# Patient Record
Sex: Female | Born: 1935
Health system: Southern US, Community
[De-identification: ages and names within clinical notes are randomized; demographics above are authoritative.]

## PROBLEM LIST (undated history)

## (undated) DIAGNOSIS — Z85038 Personal history of other malignant neoplasm of large intestine: Secondary | ICD-10-CM

## (undated) DIAGNOSIS — L988 Other specified disorders of the skin and subcutaneous tissue: Secondary | ICD-10-CM

## (undated) DIAGNOSIS — C187 Malignant neoplasm of sigmoid colon: Secondary | ICD-10-CM

## (undated) DIAGNOSIS — K8020 Calculus of gallbladder without cholecystitis without obstruction: Secondary | ICD-10-CM

## (undated) DIAGNOSIS — IMO0002 Reserved for concepts with insufficient information to code with codable children: Secondary | ICD-10-CM

## (undated) DIAGNOSIS — Z936 Other artificial openings of urinary tract status: Secondary | ICD-10-CM

## (undated) DIAGNOSIS — E46 Unspecified protein-calorie malnutrition: Secondary | ICD-10-CM

## (undated) DIAGNOSIS — M545 Low back pain, unspecified: Secondary | ICD-10-CM

## (undated) DIAGNOSIS — C541 Malignant neoplasm of endometrium: Secondary | ICD-10-CM

## (undated) DIAGNOSIS — K635 Polyp of colon: Secondary | ICD-10-CM

## (undated) DIAGNOSIS — M1990 Unspecified osteoarthritis, unspecified site: Secondary | ICD-10-CM

## (undated) DIAGNOSIS — K603 Anal fistula, unspecified: Secondary | ICD-10-CM

## (undated) DIAGNOSIS — K8050 Calculus of bile duct without cholangitis or cholecystitis without obstruction: Secondary | ICD-10-CM

## (undated) DIAGNOSIS — E538 Deficiency of other specified B group vitamins: Secondary | ICD-10-CM

## (undated) DIAGNOSIS — Q6210 Congenital occlusion of ureter, unspecified: Secondary | ICD-10-CM

## (undated) DIAGNOSIS — F329 Major depressive disorder, single episode, unspecified: Secondary | ICD-10-CM

## (undated) DIAGNOSIS — K56609 Unspecified intestinal obstruction, unspecified as to partial versus complete obstruction: Secondary | ICD-10-CM

## (undated) DIAGNOSIS — K631 Perforation of intestine (nontraumatic): Secondary | ICD-10-CM

## (undated) DIAGNOSIS — Z87442 Personal history of urinary calculi: Secondary | ICD-10-CM

## (undated) DIAGNOSIS — Z933 Colostomy status: Secondary | ICD-10-CM

## (undated) DIAGNOSIS — J701 Chronic and other pulmonary manifestations due to radiation: Secondary | ICD-10-CM

## (undated) DIAGNOSIS — F32A Depression, unspecified: Secondary | ICD-10-CM

## (undated) DIAGNOSIS — D649 Anemia, unspecified: Secondary | ICD-10-CM

## (undated) HISTORY — PX: OTHER SURGICAL HISTORY: SHX169

## (undated) HISTORY — DX: Anemia, unspecified: D64.9

## (undated) HISTORY — PX: URINARY DIVERSION: SHX2623

## (undated) HISTORY — DX: Personal history of other malignant neoplasm of large intestine: Z85.038

## (undated) HISTORY — DX: Low back pain: M54.5

## (undated) HISTORY — DX: Personal history of urinary calculi: Z87.442

## (undated) HISTORY — PX: ABCESS DRAINAGE: SHX399

## (undated) HISTORY — DX: Anal fistula, unspecified: K60.30

## (undated) HISTORY — DX: Perforation of intestine (nontraumatic): K63.1

## (undated) HISTORY — DX: Other specified disorders of the skin and subcutaneous tissue: L98.8

## (undated) HISTORY — DX: Low back pain, unspecified: M54.50

## (undated) HISTORY — DX: Malignant neoplasm of endometrium: C54.1

## (undated) HISTORY — DX: Major depressive disorder, single episode, unspecified: F32.9

## (undated) HISTORY — DX: Reserved for concepts with insufficient information to code with codable children: IMO0002

## (undated) HISTORY — DX: Chronic and other pulmonary manifestations due to radiation: J70.1

## (undated) HISTORY — PX: TONSILLECTOMY: SUR1361

## (undated) HISTORY — DX: Congenital occlusion of ureter, unspecified: Q62.10

## (undated) HISTORY — PX: ABDOMINAL HYSTERECTOMY: SHX81

## (undated) HISTORY — DX: Unspecified protein-calorie malnutrition: E46

## (undated) HISTORY — DX: Polyp of colon: K63.5

## (undated) HISTORY — DX: Unspecified intestinal obstruction, unspecified as to partial versus complete obstruction: K56.609

## (undated) HISTORY — DX: Deficiency of other specified B group vitamins: E53.8

## (undated) HISTORY — DX: Anal fistula: K60.3

## (undated) HISTORY — DX: Depression, unspecified: F32.A

---

## 1988-07-09 HISTORY — PX: LOW ANTERIOR BOWEL RESECTION: SUR1240

## 1999-05-27 ENCOUNTER — Encounter (INDEPENDENT_AMBULATORY_CARE_PROVIDER_SITE_OTHER): Payer: Self-pay | Admitting: Specialist

## 1999-05-27 ENCOUNTER — Other Ambulatory Visit: Admission: RE | Admit: 1999-05-27 | Discharge: 1999-05-27 | Payer: Self-pay | Admitting: Internal Medicine

## 2000-11-30 ENCOUNTER — Encounter: Payer: Self-pay | Admitting: Internal Medicine

## 2000-11-30 ENCOUNTER — Encounter: Admission: RE | Admit: 2000-11-30 | Discharge: 2000-11-30 | Payer: Self-pay | Admitting: Internal Medicine

## 2002-07-13 ENCOUNTER — Encounter: Payer: Self-pay | Admitting: Surgery

## 2002-07-13 ENCOUNTER — Inpatient Hospital Stay (HOSPITAL_COMMUNITY): Admission: AD | Admit: 2002-07-13 | Discharge: 2002-07-27 | Payer: Self-pay | Admitting: Surgery

## 2002-07-14 ENCOUNTER — Encounter: Payer: Self-pay | Admitting: Surgery

## 2002-07-15 ENCOUNTER — Encounter: Payer: Self-pay | Admitting: Pulmonary Disease

## 2002-07-16 ENCOUNTER — Encounter: Payer: Self-pay | Admitting: Surgery

## 2002-07-18 ENCOUNTER — Encounter: Payer: Self-pay | Admitting: Surgery

## 2002-07-20 ENCOUNTER — Encounter: Payer: Self-pay | Admitting: Surgery

## 2002-07-31 ENCOUNTER — Inpatient Hospital Stay (HOSPITAL_COMMUNITY): Admission: EM | Admit: 2002-07-31 | Discharge: 2002-08-19 | Payer: Self-pay | Admitting: *Deleted

## 2002-08-05 ENCOUNTER — Encounter: Payer: Self-pay | Admitting: Surgery

## 2002-08-06 ENCOUNTER — Encounter: Payer: Self-pay | Admitting: General Surgery

## 2002-08-08 HISTORY — PX: OTHER SURGICAL HISTORY: SHX169

## 2002-08-09 ENCOUNTER — Encounter: Payer: Self-pay | Admitting: General Surgery

## 2002-08-12 ENCOUNTER — Encounter: Payer: Self-pay | Admitting: Surgery

## 2002-08-13 ENCOUNTER — Encounter: Payer: Self-pay | Admitting: Surgery

## 2002-08-14 ENCOUNTER — Encounter: Payer: Self-pay | Admitting: Surgery

## 2002-08-27 ENCOUNTER — Encounter: Payer: Self-pay | Admitting: Surgery

## 2002-08-27 ENCOUNTER — Ambulatory Visit (HOSPITAL_COMMUNITY): Admission: RE | Admit: 2002-08-27 | Discharge: 2002-08-27 | Payer: Self-pay | Admitting: Surgery

## 2002-09-17 ENCOUNTER — Inpatient Hospital Stay (HOSPITAL_COMMUNITY): Admission: EM | Admit: 2002-09-17 | Discharge: 2002-09-21 | Payer: Self-pay | Admitting: Surgery

## 2002-09-17 ENCOUNTER — Encounter: Payer: Self-pay | Admitting: Internal Medicine

## 2002-09-18 ENCOUNTER — Encounter (INDEPENDENT_AMBULATORY_CARE_PROVIDER_SITE_OTHER): Payer: Self-pay | Admitting: *Deleted

## 2002-09-18 HISTORY — PX: OTHER SURGICAL HISTORY: SHX169

## 2002-09-19 ENCOUNTER — Encounter: Payer: Self-pay | Admitting: Surgery

## 2002-11-20 ENCOUNTER — Encounter (INDEPENDENT_AMBULATORY_CARE_PROVIDER_SITE_OTHER): Payer: Self-pay | Admitting: Specialist

## 2002-11-20 ENCOUNTER — Ambulatory Visit (HOSPITAL_COMMUNITY): Admission: RE | Admit: 2002-11-20 | Discharge: 2002-11-20 | Payer: Self-pay | Admitting: Internal Medicine

## 2002-12-19 ENCOUNTER — Inpatient Hospital Stay (HOSPITAL_COMMUNITY): Admission: RE | Admit: 2002-12-19 | Discharge: 2002-12-31 | Payer: Self-pay | Admitting: Surgery

## 2002-12-19 ENCOUNTER — Encounter (INDEPENDENT_AMBULATORY_CARE_PROVIDER_SITE_OTHER): Payer: Self-pay | Admitting: Specialist

## 2002-12-21 ENCOUNTER — Encounter: Payer: Self-pay | Admitting: Surgery

## 2002-12-23 ENCOUNTER — Encounter: Payer: Self-pay | Admitting: Surgery

## 2002-12-25 ENCOUNTER — Encounter: Payer: Self-pay | Admitting: Surgery

## 2003-04-08 ENCOUNTER — Inpatient Hospital Stay (HOSPITAL_COMMUNITY): Admission: AD | Admit: 2003-04-08 | Discharge: 2003-04-18 | Payer: Self-pay | Admitting: Internal Medicine

## 2003-04-08 ENCOUNTER — Encounter: Payer: Self-pay | Admitting: Internal Medicine

## 2003-04-11 ENCOUNTER — Encounter: Payer: Self-pay | Admitting: Cardiology

## 2003-05-08 ENCOUNTER — Inpatient Hospital Stay (HOSPITAL_COMMUNITY): Admission: EM | Admit: 2003-05-08 | Discharge: 2003-05-22 | Payer: Self-pay | Admitting: Emergency Medicine

## 2003-05-08 ENCOUNTER — Encounter: Payer: Self-pay | Admitting: General Surgery

## 2003-05-08 ENCOUNTER — Encounter: Payer: Self-pay | Admitting: Pulmonary Disease

## 2003-05-12 ENCOUNTER — Encounter: Payer: Self-pay | Admitting: Internal Medicine

## 2003-05-16 ENCOUNTER — Encounter: Payer: Self-pay | Admitting: Surgery

## 2003-05-19 ENCOUNTER — Encounter: Payer: Self-pay | Admitting: Surgery

## 2003-05-19 ENCOUNTER — Encounter: Payer: Self-pay | Admitting: Endocrinology

## 2003-08-20 ENCOUNTER — Ambulatory Visit (HOSPITAL_COMMUNITY): Admission: RE | Admit: 2003-08-20 | Discharge: 2003-08-20 | Payer: Self-pay | Admitting: Urology

## 2003-08-20 ENCOUNTER — Ambulatory Visit (HOSPITAL_BASED_OUTPATIENT_CLINIC_OR_DEPARTMENT_OTHER): Admission: RE | Admit: 2003-08-20 | Discharge: 2003-08-20 | Payer: Self-pay | Admitting: Urology

## 2003-10-30 ENCOUNTER — Ambulatory Visit (HOSPITAL_COMMUNITY): Admission: RE | Admit: 2003-10-30 | Discharge: 2003-10-30 | Payer: Self-pay | Admitting: Surgery

## 2003-11-12 ENCOUNTER — Ambulatory Visit (HOSPITAL_COMMUNITY): Admission: RE | Admit: 2003-11-12 | Discharge: 2003-11-12 | Payer: Self-pay | Admitting: Urology

## 2003-11-12 ENCOUNTER — Ambulatory Visit (HOSPITAL_BASED_OUTPATIENT_CLINIC_OR_DEPARTMENT_OTHER): Admission: RE | Admit: 2003-11-12 | Discharge: 2003-11-12 | Payer: Self-pay | Admitting: Urology

## 2004-01-07 ENCOUNTER — Ambulatory Visit (HOSPITAL_BASED_OUTPATIENT_CLINIC_OR_DEPARTMENT_OTHER): Admission: RE | Admit: 2004-01-07 | Discharge: 2004-01-07 | Payer: Self-pay | Admitting: Urology

## 2004-01-07 ENCOUNTER — Ambulatory Visit (HOSPITAL_COMMUNITY): Admission: RE | Admit: 2004-01-07 | Discharge: 2004-01-07 | Payer: Self-pay | Admitting: Urology

## 2004-01-22 ENCOUNTER — Ambulatory Visit (HOSPITAL_COMMUNITY): Admission: RE | Admit: 2004-01-22 | Discharge: 2004-01-22 | Payer: Self-pay | Admitting: Surgery

## 2004-04-14 ENCOUNTER — Ambulatory Visit (HOSPITAL_COMMUNITY): Admission: RE | Admit: 2004-04-14 | Discharge: 2004-04-14 | Payer: Self-pay | Admitting: Urology

## 2004-04-14 ENCOUNTER — Ambulatory Visit (HOSPITAL_BASED_OUTPATIENT_CLINIC_OR_DEPARTMENT_OTHER): Admission: RE | Admit: 2004-04-14 | Discharge: 2004-04-14 | Payer: Self-pay | Admitting: Urology

## 2004-06-08 ENCOUNTER — Ambulatory Visit: Payer: Self-pay | Admitting: Internal Medicine

## 2004-06-08 ENCOUNTER — Encounter: Admission: RE | Admit: 2004-06-08 | Discharge: 2004-06-08 | Payer: Self-pay | Admitting: Urology

## 2004-06-09 ENCOUNTER — Ambulatory Visit (HOSPITAL_BASED_OUTPATIENT_CLINIC_OR_DEPARTMENT_OTHER): Admission: RE | Admit: 2004-06-09 | Discharge: 2004-06-09 | Payer: Self-pay | Admitting: Urology

## 2004-06-09 ENCOUNTER — Ambulatory Visit (HOSPITAL_COMMUNITY): Admission: RE | Admit: 2004-06-09 | Discharge: 2004-06-09 | Payer: Self-pay | Admitting: Urology

## 2004-06-10 ENCOUNTER — Ambulatory Visit: Payer: Self-pay | Admitting: Internal Medicine

## 2004-08-03 ENCOUNTER — Ambulatory Visit: Payer: Self-pay | Admitting: Internal Medicine

## 2004-09-08 ENCOUNTER — Ambulatory Visit (HOSPITAL_BASED_OUTPATIENT_CLINIC_OR_DEPARTMENT_OTHER): Admission: RE | Admit: 2004-09-08 | Discharge: 2004-09-08 | Payer: Self-pay | Admitting: Urology

## 2004-09-08 ENCOUNTER — Ambulatory Visit (HOSPITAL_COMMUNITY): Admission: RE | Admit: 2004-09-08 | Discharge: 2004-09-08 | Payer: Self-pay | Admitting: Urology

## 2004-09-09 ENCOUNTER — Ambulatory Visit: Payer: Self-pay | Admitting: Internal Medicine

## 2004-12-07 ENCOUNTER — Ambulatory Visit (HOSPITAL_COMMUNITY): Admission: RE | Admit: 2004-12-07 | Discharge: 2004-12-07 | Payer: Self-pay | Admitting: Urology

## 2004-12-07 ENCOUNTER — Ambulatory Visit (HOSPITAL_BASED_OUTPATIENT_CLINIC_OR_DEPARTMENT_OTHER): Admission: RE | Admit: 2004-12-07 | Discharge: 2004-12-07 | Payer: Self-pay | Admitting: Urology

## 2004-12-23 ENCOUNTER — Ambulatory Visit: Payer: Self-pay | Admitting: Internal Medicine

## 2005-01-13 ENCOUNTER — Ambulatory Visit: Payer: Self-pay | Admitting: Internal Medicine

## 2005-01-13 ENCOUNTER — Encounter (INDEPENDENT_AMBULATORY_CARE_PROVIDER_SITE_OTHER): Payer: Self-pay | Admitting: Specialist

## 2005-01-13 HISTORY — PX: COLOSTOMY TAKEDOWN: SHX5258

## 2005-03-02 ENCOUNTER — Ambulatory Visit (HOSPITAL_BASED_OUTPATIENT_CLINIC_OR_DEPARTMENT_OTHER): Admission: RE | Admit: 2005-03-02 | Discharge: 2005-03-02 | Payer: Self-pay | Admitting: Urology

## 2005-03-02 ENCOUNTER — Ambulatory Visit (HOSPITAL_COMMUNITY): Admission: RE | Admit: 2005-03-02 | Discharge: 2005-03-02 | Payer: Self-pay | Admitting: Urology

## 2005-03-15 ENCOUNTER — Ambulatory Visit: Payer: Self-pay | Admitting: Internal Medicine

## 2005-03-29 ENCOUNTER — Ambulatory Visit (HOSPITAL_COMMUNITY): Admission: RE | Admit: 2005-03-29 | Discharge: 2005-03-29 | Payer: Self-pay | Admitting: Surgery

## 2005-05-23 ENCOUNTER — Ambulatory Visit (HOSPITAL_COMMUNITY): Admission: RE | Admit: 2005-05-23 | Discharge: 2005-05-23 | Payer: Self-pay | Admitting: Surgery

## 2005-06-01 ENCOUNTER — Ambulatory Visit (HOSPITAL_BASED_OUTPATIENT_CLINIC_OR_DEPARTMENT_OTHER): Admission: RE | Admit: 2005-06-01 | Discharge: 2005-06-01 | Payer: Self-pay | Admitting: Urology

## 2005-06-01 ENCOUNTER — Ambulatory Visit (HOSPITAL_COMMUNITY): Admission: RE | Admit: 2005-06-01 | Discharge: 2005-06-01 | Payer: Self-pay | Admitting: Urology

## 2005-06-16 ENCOUNTER — Ambulatory Visit: Payer: Self-pay | Admitting: Internal Medicine

## 2005-07-15 ENCOUNTER — Ambulatory Visit: Payer: Self-pay | Admitting: Gastroenterology

## 2005-07-21 ENCOUNTER — Ambulatory Visit: Payer: Self-pay | Admitting: Internal Medicine

## 2005-08-23 ENCOUNTER — Encounter: Admission: RE | Admit: 2005-08-23 | Discharge: 2005-08-23 | Payer: Self-pay | Admitting: Urology

## 2005-08-24 ENCOUNTER — Ambulatory Visit (HOSPITAL_BASED_OUTPATIENT_CLINIC_OR_DEPARTMENT_OTHER): Admission: RE | Admit: 2005-08-24 | Discharge: 2005-08-24 | Payer: Self-pay | Admitting: Urology

## 2005-09-15 ENCOUNTER — Ambulatory Visit: Payer: Self-pay | Admitting: Internal Medicine

## 2005-09-20 ENCOUNTER — Ambulatory Visit: Payer: Self-pay | Admitting: Internal Medicine

## 2005-10-26 ENCOUNTER — Ambulatory Visit (HOSPITAL_BASED_OUTPATIENT_CLINIC_OR_DEPARTMENT_OTHER): Admission: RE | Admit: 2005-10-26 | Discharge: 2005-10-26 | Payer: Self-pay | Admitting: Urology

## 2005-11-04 ENCOUNTER — Ambulatory Visit (HOSPITAL_COMMUNITY): Admission: RE | Admit: 2005-11-04 | Discharge: 2005-11-04 | Payer: Self-pay | Admitting: General Surgery

## 2005-12-02 ENCOUNTER — Ambulatory Visit (HOSPITAL_COMMUNITY): Admission: RE | Admit: 2005-12-02 | Discharge: 2005-12-02 | Payer: Self-pay | Admitting: Surgery

## 2005-12-20 ENCOUNTER — Ambulatory Visit: Payer: Self-pay | Admitting: Internal Medicine

## 2006-02-09 ENCOUNTER — Ambulatory Visit: Payer: Self-pay | Admitting: Internal Medicine

## 2006-02-15 ENCOUNTER — Ambulatory Visit (HOSPITAL_BASED_OUTPATIENT_CLINIC_OR_DEPARTMENT_OTHER): Admission: RE | Admit: 2006-02-15 | Discharge: 2006-02-15 | Payer: Self-pay | Admitting: Urology

## 2006-02-16 ENCOUNTER — Emergency Department (HOSPITAL_COMMUNITY): Admission: EM | Admit: 2006-02-16 | Discharge: 2006-02-16 | Payer: Self-pay | Admitting: Emergency Medicine

## 2006-02-16 ENCOUNTER — Ambulatory Visit: Payer: Self-pay | Admitting: Internal Medicine

## 2006-02-28 ENCOUNTER — Ambulatory Visit: Payer: Self-pay | Admitting: Internal Medicine

## 2006-04-04 ENCOUNTER — Ambulatory Visit: Payer: Self-pay | Admitting: Internal Medicine

## 2006-05-02 ENCOUNTER — Ambulatory Visit (HOSPITAL_COMMUNITY): Admission: RE | Admit: 2006-05-02 | Discharge: 2006-05-02 | Payer: Self-pay | Admitting: Internal Medicine

## 2006-05-24 ENCOUNTER — Ambulatory Visit (HOSPITAL_BASED_OUTPATIENT_CLINIC_OR_DEPARTMENT_OTHER): Admission: RE | Admit: 2006-05-24 | Discharge: 2006-05-24 | Payer: Self-pay | Admitting: Urology

## 2006-05-25 ENCOUNTER — Inpatient Hospital Stay (HOSPITAL_COMMUNITY): Admission: RE | Admit: 2006-05-25 | Discharge: 2006-05-27 | Payer: Self-pay | Admitting: Urology

## 2006-05-25 ENCOUNTER — Encounter: Payer: Self-pay | Admitting: Urology

## 2006-06-09 ENCOUNTER — Ambulatory Visit: Payer: Self-pay | Admitting: Internal Medicine

## 2006-06-22 ENCOUNTER — Ambulatory Visit (HOSPITAL_COMMUNITY): Admission: RE | Admit: 2006-06-22 | Discharge: 2006-06-22 | Payer: Self-pay | Admitting: Urology

## 2006-07-03 ENCOUNTER — Ambulatory Visit: Payer: Self-pay | Admitting: Internal Medicine

## 2006-07-12 ENCOUNTER — Inpatient Hospital Stay (HOSPITAL_COMMUNITY): Admission: RE | Admit: 2006-07-12 | Discharge: 2006-07-30 | Payer: Self-pay | Admitting: Surgery

## 2006-07-12 HISTORY — PX: COLOSTOMY: SHX63

## 2006-07-14 ENCOUNTER — Ambulatory Visit: Payer: Self-pay | Admitting: Internal Medicine

## 2006-07-17 ENCOUNTER — Encounter (INDEPENDENT_AMBULATORY_CARE_PROVIDER_SITE_OTHER): Payer: Self-pay | Admitting: Specialist

## 2006-07-17 HISTORY — PX: COLOSTOMY TAKEDOWN: SHX5258

## 2006-07-20 ENCOUNTER — Ambulatory Visit: Payer: Self-pay | Admitting: Internal Medicine

## 2006-08-05 ENCOUNTER — Inpatient Hospital Stay (HOSPITAL_COMMUNITY): Admission: EM | Admit: 2006-08-05 | Discharge: 2006-08-13 | Payer: Self-pay | Admitting: Emergency Medicine

## 2006-08-05 ENCOUNTER — Ambulatory Visit: Payer: Self-pay | Admitting: Internal Medicine

## 2006-08-08 HISTORY — PX: NEPHROSTOMY: SHX1014

## 2006-08-24 ENCOUNTER — Ambulatory Visit: Payer: Self-pay | Admitting: Internal Medicine

## 2006-09-04 ENCOUNTER — Ambulatory Visit: Payer: Self-pay | Admitting: Internal Medicine

## 2006-09-14 ENCOUNTER — Ambulatory Visit: Payer: Self-pay | Admitting: Internal Medicine

## 2006-09-19 ENCOUNTER — Ambulatory Visit (HOSPITAL_COMMUNITY): Admission: RE | Admit: 2006-09-19 | Discharge: 2006-09-19 | Payer: Self-pay | Admitting: Urology

## 2006-10-17 ENCOUNTER — Ambulatory Visit: Payer: Self-pay | Admitting: Cardiology

## 2006-10-19 ENCOUNTER — Ambulatory Visit: Payer: Self-pay | Admitting: Internal Medicine

## 2006-10-31 ENCOUNTER — Ambulatory Visit: Payer: Self-pay | Admitting: Pulmonary Disease

## 2006-10-31 ENCOUNTER — Inpatient Hospital Stay (HOSPITAL_COMMUNITY): Admission: EM | Admit: 2006-10-31 | Discharge: 2006-11-02 | Payer: Self-pay | Admitting: Emergency Medicine

## 2006-11-02 ENCOUNTER — Ambulatory Visit: Payer: Self-pay | Admitting: Internal Medicine

## 2006-11-21 ENCOUNTER — Ambulatory Visit (HOSPITAL_COMMUNITY): Admission: RE | Admit: 2006-11-21 | Discharge: 2006-11-21 | Payer: Self-pay | Admitting: Urology

## 2006-11-21 ENCOUNTER — Ambulatory Visit: Payer: Self-pay | Admitting: Internal Medicine

## 2006-11-21 LAB — CONVERTED CEMR LAB
Basophils Relative: 0.4 % (ref 0.0–1.0)
Bilirubin, Direct: 0.1 mg/dL (ref 0.0–0.3)
CO2: 27 meq/L (ref 19–32)
Eosinophils Absolute: 0.2 10*3/uL (ref 0.0–0.6)
Eosinophils Relative: 3.9 % (ref 0.0–5.0)
GFR calc Af Amer: 62 mL/min
Glucose, Bld: 93 mg/dL (ref 70–99)
Hemoglobin: 11.8 g/dL — ABNORMAL LOW (ref 12.0–15.0)
Lymphocytes Relative: 17.7 % (ref 12.0–46.0)
MCV: 89.8 fL (ref 78.0–100.0)
Monocytes Absolute: 0.3 10*3/uL (ref 0.2–0.7)
Monocytes Relative: 4.8 % (ref 3.0–11.0)
Neutro Abs: 4.7 10*3/uL (ref 1.4–7.7)
PTH: 57.2 pg/mL (ref 14.0–72.0)
Potassium: 4 meq/L (ref 3.5–5.1)
TSH: 1.39 microintl units/mL (ref 0.35–5.50)
Total Protein: 7 g/dL (ref 6.0–8.3)
Vitamin B-12: 626 pg/mL (ref 211–911)
WBC: 6.3 10*3/uL (ref 4.5–10.5)

## 2006-12-21 ENCOUNTER — Ambulatory Visit: Payer: Self-pay | Admitting: Internal Medicine

## 2007-01-04 ENCOUNTER — Ambulatory Visit (HOSPITAL_COMMUNITY): Admission: RE | Admit: 2007-01-04 | Discharge: 2007-01-04 | Payer: Self-pay | Admitting: Urology

## 2007-02-15 ENCOUNTER — Ambulatory Visit (HOSPITAL_COMMUNITY): Admission: RE | Admit: 2007-02-15 | Discharge: 2007-02-15 | Payer: Self-pay | Admitting: Urology

## 2007-02-19 ENCOUNTER — Inpatient Hospital Stay (HOSPITAL_COMMUNITY): Admission: EM | Admit: 2007-02-19 | Discharge: 2007-02-21 | Payer: Self-pay | Admitting: Emergency Medicine

## 2007-02-22 DIAGNOSIS — F331 Major depressive disorder, recurrent, moderate: Secondary | ICD-10-CM | POA: Insufficient documentation

## 2007-02-22 DIAGNOSIS — K603 Anal fistula: Secondary | ICD-10-CM | POA: Insufficient documentation

## 2007-02-22 DIAGNOSIS — D126 Benign neoplasm of colon, unspecified: Secondary | ICD-10-CM | POA: Insufficient documentation

## 2007-02-22 DIAGNOSIS — Z85038 Personal history of other malignant neoplasm of large intestine: Secondary | ICD-10-CM | POA: Insufficient documentation

## 2007-02-22 DIAGNOSIS — C549 Malignant neoplasm of corpus uteri, unspecified: Secondary | ICD-10-CM | POA: Insufficient documentation

## 2007-02-22 DIAGNOSIS — N135 Crossing vessel and stricture of ureter without hydronephrosis: Secondary | ICD-10-CM | POA: Insufficient documentation

## 2007-02-22 DIAGNOSIS — M810 Age-related osteoporosis without current pathological fracture: Secondary | ICD-10-CM | POA: Insufficient documentation

## 2007-02-22 DIAGNOSIS — E538 Deficiency of other specified B group vitamins: Secondary | ICD-10-CM | POA: Insufficient documentation

## 2007-02-22 DIAGNOSIS — N951 Menopausal and female climacteric states: Secondary | ICD-10-CM | POA: Insufficient documentation

## 2007-03-02 ENCOUNTER — Ambulatory Visit: Payer: Self-pay | Admitting: Internal Medicine

## 2007-03-05 ENCOUNTER — Ambulatory Visit (HOSPITAL_COMMUNITY): Admission: RE | Admit: 2007-03-05 | Discharge: 2007-03-05 | Payer: Self-pay | Admitting: Urology

## 2007-04-19 ENCOUNTER — Ambulatory Visit: Payer: Self-pay | Admitting: Internal Medicine

## 2007-04-19 ENCOUNTER — Ambulatory Visit (HOSPITAL_COMMUNITY): Admission: RE | Admit: 2007-04-19 | Discharge: 2007-04-19 | Payer: Self-pay | Admitting: Urology

## 2007-04-19 LAB — CONVERTED CEMR LAB
AST: 18 units/L (ref 0–37)
Albumin: 3.3 g/dL — ABNORMAL LOW (ref 3.5–5.2)
Basophils Absolute: 0 10*3/uL (ref 0.0–0.1)
Chloride: 107 meq/L (ref 96–112)
Eosinophils Absolute: 0.2 10*3/uL (ref 0.0–0.6)
GFR calc non Af Amer: 65 mL/min
MCHC: 35.6 g/dL (ref 30.0–36.0)
MCV: 92.8 fL (ref 78.0–100.0)
Monocytes Relative: 6.6 % (ref 3.0–11.0)
Neutrophils Relative %: 62.8 % (ref 43.0–77.0)
Platelets: 163 10*3/uL (ref 150–400)
RBC: 3.71 M/uL — ABNORMAL LOW (ref 3.87–5.11)
Sodium: 141 meq/L (ref 135–145)
TSH: 1.92 microintl units/mL (ref 0.35–5.50)
Vitamin B-12: 177 pg/mL — ABNORMAL LOW (ref 211–911)

## 2007-04-27 DIAGNOSIS — M545 Low back pain, unspecified: Secondary | ICD-10-CM | POA: Insufficient documentation

## 2007-04-27 DIAGNOSIS — D649 Anemia, unspecified: Secondary | ICD-10-CM | POA: Insufficient documentation

## 2007-04-30 ENCOUNTER — Ambulatory Visit (HOSPITAL_COMMUNITY): Admission: RE | Admit: 2007-04-30 | Discharge: 2007-04-30 | Payer: Self-pay | Admitting: Urology

## 2007-05-08 ENCOUNTER — Ambulatory Visit: Payer: Self-pay | Admitting: Internal Medicine

## 2007-06-12 ENCOUNTER — Ambulatory Visit (HOSPITAL_COMMUNITY): Admission: RE | Admit: 2007-06-12 | Discharge: 2007-06-12 | Payer: Self-pay | Admitting: Urology

## 2007-07-09 ENCOUNTER — Telehealth: Payer: Self-pay | Admitting: Internal Medicine

## 2007-07-23 ENCOUNTER — Telehealth (INDEPENDENT_AMBULATORY_CARE_PROVIDER_SITE_OTHER): Payer: Self-pay | Admitting: *Deleted

## 2007-07-26 ENCOUNTER — Ambulatory Visit (HOSPITAL_COMMUNITY): Admission: RE | Admit: 2007-07-26 | Discharge: 2007-07-26 | Payer: Self-pay | Admitting: Urology

## 2007-08-03 ENCOUNTER — Ambulatory Visit: Payer: Self-pay | Admitting: Internal Medicine

## 2007-08-03 DIAGNOSIS — E559 Vitamin D deficiency, unspecified: Secondary | ICD-10-CM | POA: Insufficient documentation

## 2007-08-03 DIAGNOSIS — Z87442 Personal history of urinary calculi: Secondary | ICD-10-CM | POA: Insufficient documentation

## 2007-08-03 LAB — CONVERTED CEMR LAB
Eosinophils Relative: 4.4 % (ref 0.0–5.0)
HCT: 37 % (ref 36.0–46.0)
MCV: 92.4 fL (ref 78.0–100.0)
Neutrophils Relative %: 67.4 % (ref 43.0–77.0)
Potassium: 4.2 meq/L (ref 3.5–5.1)
RBC: 4 M/uL (ref 3.87–5.11)
RDW: 13.5 % (ref 11.5–14.6)
Sodium: 142 meq/L (ref 135–145)
Vitamin B-12: 477 pg/mL (ref 211–911)
WBC: 5.2 10*3/uL (ref 4.5–10.5)

## 2007-08-15 LAB — CONVERTED CEMR LAB: Vit D, 1,25-Dihydroxy: 26 — ABNORMAL LOW (ref 30–89)

## 2007-08-29 ENCOUNTER — Ambulatory Visit (HOSPITAL_COMMUNITY): Admission: RE | Admit: 2007-08-29 | Discharge: 2007-08-29 | Payer: Self-pay | Admitting: Urology

## 2007-10-03 ENCOUNTER — Ambulatory Visit (HOSPITAL_COMMUNITY): Admission: RE | Admit: 2007-10-03 | Discharge: 2007-10-03 | Payer: Self-pay | Admitting: Urology

## 2007-10-05 ENCOUNTER — Telehealth: Payer: Self-pay | Admitting: Internal Medicine

## 2007-10-12 ENCOUNTER — Ambulatory Visit: Payer: Self-pay | Admitting: Internal Medicine

## 2007-11-07 ENCOUNTER — Ambulatory Visit (HOSPITAL_COMMUNITY): Admission: RE | Admit: 2007-11-07 | Discharge: 2007-11-07 | Payer: Self-pay | Admitting: Urology

## 2007-12-06 ENCOUNTER — Ambulatory Visit: Payer: Self-pay | Admitting: Internal Medicine

## 2007-12-06 LAB — CONVERTED CEMR LAB
ALT: 14 U/L
AST: 16 U/L
Albumin: 3.6 g/dL
Alkaline Phosphatase: 100 U/L
BUN: 10 mg/dL
Bilirubin, Direct: 0.1 mg/dL
CO2: 30 meq/L
Calcium: 9.8 mg/dL
Chloride: 106 meq/L
Creatinine, Ser: 1 mg/dL
GFR calc Af Amer: 69 mL/min
GFR calc non Af Amer: 57 mL/min
Glucose, Bld: 100 mg/dL — ABNORMAL HIGH
Potassium: 4.3 meq/L
Sodium: 140 meq/L
TSH: 4.38 u[IU]/mL
Total Bilirubin: 0.6 mg/dL
Total Protein: 6.5 g/dL

## 2007-12-07 ENCOUNTER — Encounter: Payer: Self-pay | Admitting: Internal Medicine

## 2007-12-07 ENCOUNTER — Inpatient Hospital Stay (HOSPITAL_COMMUNITY): Admission: EM | Admit: 2007-12-07 | Discharge: 2007-12-11 | Payer: Self-pay | Admitting: Emergency Medicine

## 2007-12-07 ENCOUNTER — Ambulatory Visit: Payer: Self-pay | Admitting: Internal Medicine

## 2007-12-07 DIAGNOSIS — N12 Tubulo-interstitial nephritis, not specified as acute or chronic: Secondary | ICD-10-CM | POA: Insufficient documentation

## 2007-12-18 ENCOUNTER — Ambulatory Visit: Payer: Self-pay | Admitting: Internal Medicine

## 2008-01-10 ENCOUNTER — Ambulatory Visit (HOSPITAL_COMMUNITY): Admission: RE | Admit: 2008-01-10 | Discharge: 2008-01-10 | Payer: Self-pay | Admitting: Urology

## 2008-02-18 ENCOUNTER — Ambulatory Visit: Payer: Self-pay | Admitting: Internal Medicine

## 2008-02-19 ENCOUNTER — Ambulatory Visit (HOSPITAL_COMMUNITY): Admission: RE | Admit: 2008-02-19 | Discharge: 2008-02-19 | Payer: Self-pay | Admitting: Urology

## 2008-04-08 ENCOUNTER — Ambulatory Visit: Payer: Self-pay | Admitting: Internal Medicine

## 2008-04-08 LAB — CONVERTED CEMR LAB
BUN: 19 mg/dL (ref 6–23)
Basophils Relative: 0.8 % (ref 0.0–3.0)
Chloride: 111 meq/L (ref 96–112)
Creatinine, Ser: 1 mg/dL (ref 0.4–1.2)
Eosinophils Absolute: 0.3 10*3/uL (ref 0.0–0.7)
Eosinophils Relative: 6.3 % — ABNORMAL HIGH (ref 0.0–5.0)
GFR calc non Af Amer: 57 mL/min
Glucose, Bld: 100 mg/dL — ABNORMAL HIGH (ref 70–99)
HCT: 39.5 % (ref 36.0–46.0)
MCV: 94.1 fL (ref 78.0–100.0)
Monocytes Absolute: 0.4 10*3/uL (ref 0.1–1.0)
Monocytes Relative: 7.4 % (ref 3.0–12.0)
Neutrophils Relative %: 62.3 % (ref 43.0–77.0)
RBC: 4.2 M/uL (ref 3.87–5.11)
WBC: 5.1 10*3/uL (ref 4.5–10.5)

## 2008-04-29 ENCOUNTER — Ambulatory Visit: Payer: Self-pay | Admitting: Internal Medicine

## 2008-06-30 ENCOUNTER — Ambulatory Visit: Payer: Self-pay | Admitting: Internal Medicine

## 2008-08-28 ENCOUNTER — Ambulatory Visit: Payer: Self-pay | Admitting: Internal Medicine

## 2008-08-28 LAB — CONVERTED CEMR LAB
Albumin: 3.6 g/dL (ref 3.5–5.2)
BUN: 17 mg/dL (ref 6–23)
Basophils Absolute: 0 10*3/uL (ref 0.0–0.1)
Basophils Relative: 0.2 % (ref 0.0–3.0)
Calcium: 9.9 mg/dL (ref 8.4–10.5)
Creatinine, Ser: 1.1 mg/dL (ref 0.4–1.2)
Eosinophils Absolute: 0.2 10*3/uL (ref 0.0–0.7)
Eosinophils Relative: 4.6 % (ref 0.0–5.0)
GFR calc Af Amer: 62 mL/min
GFR calc non Af Amer: 51 mL/min
HCT: 37.3 % (ref 36.0–46.0)
Hemoglobin: 12.8 g/dL (ref 12.0–15.0)
MCHC: 34.3 g/dL (ref 30.0–36.0)
MCV: 92.3 fL (ref 78.0–100.0)
Monocytes Absolute: 0.4 10*3/uL (ref 0.1–1.0)
Neutro Abs: 2.9 10*3/uL (ref 1.4–7.7)
Neutrophils Relative %: 60.7 % (ref 43.0–77.0)
RBC: 4.04 M/uL (ref 3.87–5.11)
Total Bilirubin: 0.6 mg/dL (ref 0.3–1.2)

## 2008-09-03 ENCOUNTER — Ambulatory Visit: Payer: Self-pay | Admitting: Internal Medicine

## 2008-10-29 ENCOUNTER — Ambulatory Visit: Payer: Self-pay | Admitting: Internal Medicine

## 2008-12-31 ENCOUNTER — Ambulatory Visit: Payer: Self-pay | Admitting: Internal Medicine

## 2009-01-02 LAB — CONVERTED CEMR LAB
ALT: 14 units/L (ref 0–35)
AST: 20 units/L (ref 0–37)
BUN: 20 mg/dL (ref 6–23)
Basophils Relative: 0.1 % (ref 0.0–3.0)
Bilirubin, Direct: 0.2 mg/dL (ref 0.0–0.3)
Calcium: 9.9 mg/dL (ref 8.4–10.5)
Eosinophils Absolute: 0.1 10*3/uL (ref 0.0–0.7)
Eosinophils Relative: 3.4 % (ref 0.0–5.0)
GFR calc non Af Amer: 56.84 mL/min (ref >60)
Glucose, Bld: 83 mg/dL (ref 70–99)
HCT: 36.9 % (ref 36.0–46.0)
Lymphs Abs: 0.7 10*3/uL (ref 0.7–4.0)
MCHC: 33.8 g/dL (ref 30.0–36.0)
MCV: 91.3 fL (ref 78.0–100.0)
Monocytes Absolute: 0.2 10*3/uL (ref 0.1–1.0)
Neutrophils Relative %: 70.7 % (ref 43.0–77.0)
Platelets: 127 10*3/uL — ABNORMAL LOW (ref 150.0–400.0)
Potassium: 4.1 meq/L (ref 3.5–5.1)
Sodium: 142 meq/L (ref 135–145)
TSH: 3.63 microintl units/mL (ref 0.35–5.50)
Total Bilirubin: 0.7 mg/dL (ref 0.3–1.2)
WBC: 3.8 10*3/uL — ABNORMAL LOW (ref 4.5–10.5)

## 2009-02-27 ENCOUNTER — Ambulatory Visit: Payer: Self-pay | Admitting: Internal Medicine

## 2009-04-22 ENCOUNTER — Encounter: Payer: Self-pay | Admitting: Internal Medicine

## 2009-05-07 ENCOUNTER — Ambulatory Visit: Payer: Self-pay | Admitting: Internal Medicine

## 2009-05-07 LAB — CONVERTED CEMR LAB
BUN: 12 mg/dL (ref 6–23)
Basophils Absolute: 0 10*3/uL (ref 0.0–0.1)
Calcium: 10 mg/dL (ref 8.4–10.5)
Creatinine, Ser: 1.1 mg/dL (ref 0.4–1.2)
Eosinophils Relative: 3.2 % (ref 0.0–5.0)
GFR calc non Af Amer: 50.87 mL/min (ref >60)
Glucose, Bld: 90 mg/dL (ref 70–99)
Lymphocytes Relative: 11.1 % — ABNORMAL LOW (ref 12.0–46.0)
Lymphs Abs: 0.6 10*3/uL — ABNORMAL LOW (ref 0.7–4.0)
Monocytes Relative: 3.7 % (ref 3.0–12.0)
Neutrophils Relative %: 81.8 % — ABNORMAL HIGH (ref 43.0–77.0)
Platelets: 139 10*3/uL — ABNORMAL LOW (ref 150.0–400.0)
RDW: 13.5 % (ref 11.5–14.6)
Vit D, 25-Hydroxy: 30 ng/mL (ref 30–89)
Vitamin B-12: 622 pg/mL (ref 211–911)
WBC: 5.5 10*3/uL (ref 4.5–10.5)

## 2009-05-11 ENCOUNTER — Ambulatory Visit: Payer: Self-pay | Admitting: Internal Medicine

## 2009-07-08 ENCOUNTER — Ambulatory Visit: Payer: Self-pay | Admitting: Internal Medicine

## 2009-07-08 DIAGNOSIS — Z87891 Personal history of nicotine dependence: Secondary | ICD-10-CM | POA: Insufficient documentation

## 2009-09-09 ENCOUNTER — Ambulatory Visit: Payer: Self-pay | Admitting: Internal Medicine

## 2009-11-06 ENCOUNTER — Ambulatory Visit: Payer: Self-pay | Admitting: Internal Medicine

## 2009-11-11 LAB — CONVERTED CEMR LAB
AST: 13 units/L (ref 0–37)
BUN: 9 mg/dL (ref 6–23)
Basophils Absolute: 0 10*3/uL (ref 0.0–0.1)
Bilirubin, Direct: 0.1 mg/dL (ref 0.0–0.3)
Eosinophils Relative: 5.6 % — ABNORMAL HIGH (ref 0.0–5.0)
GFR calc non Af Amer: 56.71 mL/min (ref >60)
HCT: 35.8 % — ABNORMAL LOW (ref 36.0–46.0)
Hemoglobin: 12.1 g/dL (ref 12.0–15.0)
Lymphs Abs: 0.8 10*3/uL (ref 0.7–4.0)
MCV: 92.9 fL (ref 78.0–100.0)
Monocytes Absolute: 0.3 10*3/uL (ref 0.1–1.0)
Monocytes Relative: 8.9 % (ref 3.0–12.0)
Neutro Abs: 2.4 10*3/uL (ref 1.4–7.7)
Potassium: 4.6 meq/L (ref 3.5–5.1)
RDW: 14.4 % (ref 11.5–14.6)
Sodium: 146 meq/L — ABNORMAL HIGH (ref 135–145)
TSH: 2.3 microintl units/mL (ref 0.35–5.50)
Total Bilirubin: 0.5 mg/dL (ref 0.3–1.2)

## 2010-01-08 ENCOUNTER — Ambulatory Visit: Payer: Self-pay | Admitting: Internal Medicine

## 2010-03-01 ENCOUNTER — Telehealth: Payer: Self-pay | Admitting: Internal Medicine

## 2010-03-03 ENCOUNTER — Ambulatory Visit: Payer: Self-pay | Admitting: Internal Medicine

## 2010-03-03 LAB — CONVERTED CEMR LAB
ALT: 14 units/L (ref 0–35)
BUN: 14 mg/dL (ref 6–23)
CO2: 28 meq/L (ref 19–32)
Chloride: 104 meq/L (ref 96–112)
Creatinine, Ser: 1 mg/dL (ref 0.4–1.2)
Eosinophils Relative: 3.8 % (ref 0.0–5.0)
Lymphocytes Relative: 16.2 % (ref 12.0–46.0)
Monocytes Absolute: 0.2 10*3/uL (ref 0.1–1.0)
Monocytes Relative: 6.1 % (ref 3.0–12.0)
Neutrophils Relative %: 73.7 % (ref 43.0–77.0)
Platelets: 126 10*3/uL — ABNORMAL LOW (ref 150.0–400.0)
TSH: 2.11 microintl units/mL (ref 0.35–5.50)
Total Protein: 6.1 g/dL (ref 6.0–8.3)
WBC: 3.9 10*3/uL — ABNORMAL LOW (ref 4.5–10.5)

## 2010-03-05 ENCOUNTER — Ambulatory Visit: Payer: Self-pay | Admitting: Internal Medicine

## 2010-03-05 DIAGNOSIS — R142 Eructation: Secondary | ICD-10-CM

## 2010-03-05 DIAGNOSIS — R143 Flatulence: Secondary | ICD-10-CM

## 2010-03-05 DIAGNOSIS — R141 Gas pain: Secondary | ICD-10-CM | POA: Insufficient documentation

## 2010-04-29 ENCOUNTER — Telehealth: Payer: Self-pay | Admitting: Internal Medicine

## 2010-04-29 ENCOUNTER — Ambulatory Visit: Payer: Self-pay | Admitting: Internal Medicine

## 2010-04-29 LAB — CONVERTED CEMR LAB
ALT: 9 units/L (ref 0–35)
BUN: 14 mg/dL (ref 6–23)
Basophils Absolute: 0 10*3/uL (ref 0.0–0.1)
Bilirubin, Direct: 0.1 mg/dL (ref 0.0–0.3)
Chloride: 103 meq/L (ref 96–112)
Cholesterol: 148 mg/dL (ref 0–200)
Creatinine, Ser: 0.9 mg/dL (ref 0.4–1.2)
Eosinophils Absolute: 0.2 10*3/uL (ref 0.0–0.7)
Eosinophils Relative: 5.2 % — ABNORMAL HIGH (ref 0.0–5.0)
LDL Cholesterol: 70 mg/dL (ref 0–99)
MCV: 92.7 fL (ref 78.0–100.0)
Monocytes Absolute: 0.3 10*3/uL (ref 0.1–1.0)
Neutrophils Relative %: 73.8 % (ref 43.0–77.0)
Platelets: 149 10*3/uL — ABNORMAL LOW (ref 150.0–400.0)
Total Bilirubin: 0.4 mg/dL (ref 0.3–1.2)
Triglycerides: 98 mg/dL (ref 0.0–149.0)
VLDL: 19.6 mg/dL (ref 0.0–40.0)
WBC: 4.7 10*3/uL (ref 4.5–10.5)

## 2010-05-07 ENCOUNTER — Ambulatory Visit: Payer: Self-pay | Admitting: Internal Medicine

## 2010-05-07 DIAGNOSIS — R634 Abnormal weight loss: Secondary | ICD-10-CM | POA: Insufficient documentation

## 2010-07-09 ENCOUNTER — Ambulatory Visit: Payer: Self-pay | Admitting: Internal Medicine

## 2010-08-29 ENCOUNTER — Encounter: Payer: Self-pay | Admitting: Urology

## 2010-09-02 ENCOUNTER — Telehealth: Payer: Self-pay | Admitting: Internal Medicine

## 2010-09-03 ENCOUNTER — Ambulatory Visit: Admit: 2010-09-03 | Payer: Self-pay | Admitting: Internal Medicine

## 2010-09-07 NOTE — Assessment & Plan Note (Signed)
Summary: 2 MO ROV /NWS  #   Vital Signs:  Patient profile:   77 year old female Weight:      114 pounds Temp:     97.2 degrees F oral Pulse rate:   64 / minute BP sitting:   102 / 50  (left arm)  Vitals Entered By: Tora Perches (November 06, 2009 8:58 AM) CC: f/u Is Patient Diabetic? No   CC:  f/u.  History of Present Illness: The patient presents for a follow up of abd/ back pain, anxiety, depression wt loss.   Preventive Screening-Counseling & Management  Alcohol-Tobacco     Smoking Status: quit  Current Medications (verified): 1)  Mirtazapine 15 Mg  Tbdp (Mirtazapine) .Marland Kitchen.. 1 By Mouth Qhs 2)  Oxytrol 3.9 Mg/24hr  Pttw (Oxybutynin) .... Q 3  D 3)  Vitamin D3 1000 Unit  Tabs (Cholecalciferol) .... 2-3 Tabs By Mouth Daily 4)  Vitamin B-12 1000 Mcg  Tabs (Cyanocobalamin) .... 2 Once Daily Sl 5)  Temazepam 30 Mg Caps (Temazepam) .Marland Kitchen.. 1 At Bedtime Prn 6)  Oxycodone-Acetaminophen 10-325 Mg  Tabs (Oxycodone-Acetaminophen) .Marland Kitchen.. 1 By Mouth Qid As Needed. Please,  Fill On or After   10/06/09        . Ok To Fill 1 Day Earlier When The Month Has 31 Days in It. 7)  Iron 65mg  .... Once Daily  Allergies: 1)  ! Cipro (Ciprofloxacin Hcl) 2)  ! Neurontin (Gabapentin) 3)  ! Effexor 4)  ! Evista (Raloxifene Hcl)  Past History:  Past Medical History: Last updated: 09/09/2009 Colon cancer, hx of Depression Osteoporosis Anemia-NOS Low back pain Nephrolithiasis, hx of Retroperitoneal stenosis Ureteral stenosis B urethral tubes changed q 4 wks Vit D def Vit B12 def  Past Surgical History: Last updated: 02/18/2008 Hysterectomy Colectomy Colostomy B nephrostomy B 2008 Dr Serena Colonel  Social History: Last updated: 02/22/2007 Single Former Smoker Regular exercise-no  Review of Systems       put wt on  Physical Exam  General:  Thin Ears:  External ear exam shows no significant lesions or deformities.  Otoscopic examination reveals clear canals, tympanic membranes are intact  bilaterally without bulging, retraction, inflammation or discharge. Hearing is grossly normal bilaterally. Nose:  External nasal examination shows no deformity or inflammation. Nasal mucosa are pink and moist without lesions or exudates. Mouth:  Oral mucosa and oropharynx without lesions or exudates.  Teeth in good repair. Lungs:  CTA Heart:  RRR Abdomen:  NT Colostomy bag is present Msk:  Lumbar-sacral spine is tender to palpation over paraspinal muscles and painfull with the ROM  Extremities:  No clubbing, cyanosis, edema, or deformity noted with normal full range of motion of all joints.   Neurologic:  No cranial nerve deficits noted. Station and gait are normal. Plantar reflexes are down-going bilaterally. DTRs are symmetrical throughout. Sensory, motor and coordinative functions appear intact. Skin:  Intact without suspicious lesions or rashes Psych:  Less  depressed affect.     Impression & Recommendations:  Problem # 1:  VITAMIN B12 DEFICIENCY (ICD-266.2) Assessment Improved On prescription/OTC  therapy   Problem # 2:  ABDOMINAL PAIN (ICD-789.00) Assessment: Unchanged On prescription therapy   Problem # 3:  VITAMIN D DEFICIENCY (ICD-268.9) Assessment: Improved On OTC  therapy   Problem # 4:  ANEMIA-NOS (ICD-285.9) Assessment: Improved  Her updated medication list for this problem includes:    Vitamin B-12 1000 Mcg Tabs (Cyanocobalamin) .Marland Kitchen... 2 once daily sl  Complete Medication List: 1)  Oxycodone-acetaminophen 10-325  Mg Tabs (Oxycodone-acetaminophen) .Marland Kitchen.. 1 by mouth qid as needed. please,  fill on or after   12/06/09        . ok to fill 1 day earlier when the month has 31 days in it. 2)  Mirtazapine 15 Mg Tbdp (Mirtazapine) .Marland Kitchen.. 1 by mouth qhs 3)  Oxytrol 3.9 Mg/24hr Pttw (Oxybutynin) .... Q 3  d 4)  Vitamin D3 1000 Unit Tabs (Cholecalciferol) .... 2-3 tabs by mouth daily 5)  Vitamin B-12 1000 Mcg Tabs (Cyanocobalamin) .... 2 once daily sl 6)  Temazepam 30 Mg Caps  (Temazepam) .Marland Kitchen.. 1 at bedtime prn 7)  Iron 65mg   .... Once daily  Patient Instructions: 1)  Please schedule a follow-up appointment in 2 months. Prescriptions: OXYCODONE-ACETAMINOPHEN 10-325 MG  TABS (OXYCODONE-ACETAMINOPHEN) 1 by mouth qid as needed. Please,  fill on or after   12/06/09        . OK to fill 1 day earlier when the month has 31 days in it.  #120 x 0   Entered and Authorized by:   Tresa Garter MD   Signed by:   Tresa Garter MD on 11/06/2009   Method used:   Print then Give to Patient   RxID:   [id number] OXYCODONE-ACETAMINOPHEN 10-325 MG  TABS (OXYCODONE-ACETAMINOPHEN) 1 by mouth qid as needed. Please,  fill on or after   11/06/09        . OK to fill 1 day earlier when the month has 31 days in it.  #120 x 0   Entered and Authorized by:   Tresa Garter MD   Signed by:   Tresa Garter MD on 11/06/2009   Method used:   Print then Give to Patient   RxID:   [id number]

## 2010-09-07 NOTE — Assessment & Plan Note (Signed)
Summary: 2 MO ROV /NWS  #   Vital Signs:  Patient profile:   77 year old female Height:      64 inches Weight:      109 pounds BMI:     18.78 Temp:     97.1 degrees F oral Pulse rate:   76 / minute Pulse rhythm:   regular Resp:     16 per minute BP sitting:   100 / 60  (left arm) Cuff size:   regular  Vitals Entered By: Lanier Prude, CMA(AAMA) (May 07, 2010 8:14 AM) CC: 2 mo f/u Is Patient Diabetic? No   CC:  2 mo f/u.  History of Present Illness: The patient presents for a follow up of abd and  back pain, anxiety, depression and. She had a lot of dental work done  Current Medications (verified): 1)  Oxycodone-Acetaminophen 10-325 Mg  Tabs (Oxycodone-Acetaminophen) .Marland Kitchen.. 1 By Mouth Qid As Needed. Please,  Fill On or After   04/08/10        . Ok To Fill 1 Day Earlier When The Month Has 31 Days in It. 2)  Mirtazapine 15 Mg  Tbdp (Mirtazapine) .Marland Kitchen.. 1 By Mouth Qhs 3)  Oxytrol 3.9 Mg/24hr  Pttw (Oxybutynin) .... Q 3  D 4)  Vitamin D3 1000 Unit  Tabs (Cholecalciferol) .... 2-3 Tabs By Mouth Daily 5)  Vitamin B-12 1000 Mcg  Tabs (Cyanocobalamin) .... 2 Once Daily Sl 6)  Temazepam 30 Mg Caps (Temazepam) .Marland Kitchen.. 1 At Bedtime Prn 7)  Iron 65mg  .... Once Daily 8)  Align  Caps (Probiotic Product) .Marland Kitchen.. 1 By Mouth Once Daily For Your Intesinal Flora Restoraion  Allergies (verified): 1)  ! Cipro (Ciprofloxacin Hcl) 2)  ! Neurontin (Gabapentin) 3)  ! Effexor 4)  ! Evista (Raloxifene Hcl)  Past History:  Past Medical History: Last updated: 03/05/2010 Colon cancer, hx of Depression Osteoporosis Pt declined rx Anemia-NOS Low back pain Nephrolithiasis, hx of Retroperitoneal stenosis Ureteral stenosis B urethral tubes changed q 4 wks Vit D def Vit B12 def  Past Surgical History: Last updated: 02/18/2008 Hysterectomy Colectomy Colostomy B nephrostomy B 2008 Dr Serena Colonel  Family History: Last updated: 02/22/2007 Family History Hypertension Family History Kidney  disease  Social History: Last updated: 02/22/2007 Single Former Smoker Regular exercise-no  Review of Systems       The patient complains of weight loss and abdominal pain.  The patient denies fever, dyspnea on exertion, and prolonged cough.    Physical Exam  General:  Thin Eyes:  No corneal or conjunctival inflammation noted. EOMI. Perrla. Nose:  External nasal examination shows no deformity or inflammation. Nasal mucosa are pink and moist without lesions or exudates. Mouth:  Oral mucosa and oropharynx without lesions or exudates.  Teeth in good repair. Lungs:  CTA Heart:  RRR Abdomen:  NT Colostomy bag is present Msk:  Lumbar-sacral spine is tender to palpation over paraspinal muscles and painfull with the ROM  Extremities:  No clubbing, cyanosis, edema, or deformity noted with normal full range of motion of all joints.   Neurologic:  No cranial nerve deficits noted. Station and gait are normal. Plantar reflexes are down-going bilaterally. DTRs are symmetrical throughout. Sensory, motor and coordinative functions appear intact. Skin:  Intact without suspicious lesions or rashes Psych:  Less  depressed affect.     Impression & Recommendations:  Problem # 1:  WEIGHT LOSS (ICD-783.21) due to dental work Assessment New The labs were reviewed with the patient.   Problem #  2:  FLATULENCE (ICD-787.3) Assessment: Unchanged Align  Problem # 3:  COLOSTOMY STATUS (ICD-V44.3) Assessment: Unchanged  Problem # 4:  LOW BACK PAIN (ICD-724.2) Assessment: Unchanged  Her updated medication list for this problem includes:    Oxycodone-acetaminophen 10-325 Mg Tabs (Oxycodone-acetaminophen) .Marland Kitchen... 1 by mouth qid as needed. please,  fill on or after   06/08/10        . ok to fill 1 day earlier when the month has 31 days in it.  Problem # 5:  VITAMIN B12 DEFICIENCY (ICD-266.2) Assessment: Unchanged On the regimen of medicine(s) reflected in the chart    Problem # 6:  URINARY TRACT  INFECTION (UTI) (ICD-599.0) S/p two urostomy Her updated medication list for this problem includes:    Oxytrol 3.9 Mg/24hr Pttw (Oxybutynin) ..... Q 3  d  Problem # 7:  OSTEOPOROSIS (ICD-733.00) Assessment: Comment Only She has been refusing rx  Complete Medication List: 1)  Oxycodone-acetaminophen 10-325 Mg Tabs (Oxycodone-acetaminophen) .Marland Kitchen.. 1 by mouth qid as needed. please,  fill on or after   06/08/10        . ok to fill 1 day earlier when the month has 31 days in it. 2)  Mirtazapine 15 Mg Tbdp (Mirtazapine) .Marland Kitchen.. 1 by mouth qhs 3)  Oxytrol 3.9 Mg/24hr Pttw (Oxybutynin) .... Q 3  d 4)  Vitamin D3 1000 Unit Tabs (Cholecalciferol) .... 2-3 tabs by mouth daily 5)  Vitamin B-12 1000 Mcg Tabs (Cyanocobalamin) .... 2 once daily sl 6)  Temazepam 30 Mg Caps (Temazepam) .Marland Kitchen.. 1 at bedtime prn 7)  Iron 65mg   .... Once daily 8)  Align Caps (Probiotic product) .Marland Kitchen.. 1 by mouth once daily for your intesinal flora restoraion  Patient Instructions: 1)  Please schedule a follow-up appointment in 2 months. Prescriptions: MIRTAZAPINE 15 MG  TBDP (MIRTAZAPINE) 1 by mouth qhs  #30 Tablet x 5   Entered and Authorized by:   Tresa Garter MD   Signed by:   Tresa Garter MD on 05/07/2010   Method used:   Print then Give to Patient   RxID:   [id number] OXYCODONE-ACETAMINOPHEN 10-325 MG  TABS (OXYCODONE-ACETAMINOPHEN) 1 by mouth qid as needed. Please,  fill on or after   06/08/10        . OK to fill 1 day earlier when the month has 31 days in it.  #120 x 0   Entered and Authorized by:   Tresa Garter MD   Signed by:   Tresa Garter MD on 05/07/2010   Method used:   Print then Give to Patient   RxID:   [id number] OXYCODONE-ACETAMINOPHEN 10-325 MG  TABS (OXYCODONE-ACETAMINOPHEN) 1 by mouth qid as needed. Please,  fill on or after   05/08/10        . OK to fill 1 day earlier when the month has 31 days in it.  #120 x 0   Entered and Authorized by:   Tresa Garter MD    Signed by:   Tresa Garter MD on 05/07/2010   Method used:   Print then Give to Patient   RxID:   [id number]   Appended Document: 2 MO [facility] Natale Milch  #     Clinical Lists Changes  Orders: Added new Service order of Flu Vaccine 105yrs + MEDICARE PATIENTS (V3710) - Signed Added new Service order of Administration Flu vaccine - MCR (G2694) - Signed Observations: Added new observation of FLU VAX VIS: 03/02/2010 version (07/12/2010 9:04) Added new observation  of FLU VAXLOT: [id number] (07/12/2010 9:04) Added new observation of FLU VAXMFR: [employer] (07/12/2010 9:04) Added new observation of FLU VAX EXP: 02/05/2011 (07/12/2010 9:04) Added new observation of FLU VAX DSE: 0.10ml (07/12/2010 9:04) Added new observation of FLU VAX: Fluvax 3+ (07/12/2010 9:04)          Flu Vaccine Consent Questions     Do you have a history of severe allergic reactions to this vaccine? no    Any prior history of allergic reactions to egg and/or gelatin? no    Do you have a sensitivity to the preservative Thimersol? no    Do you have a past history of Guillan-Barre Syndrome? no    Do you currently have an acute febrile illness? no    Have you ever had a severe reaction to latex? no    Vaccine information given and explained to patient? yes    Are you currently pregnant? no    Lot Number:[id number]   Exp Date:02/05/2011   Site Given  Left Deltoid IM Lanier Prude, [facility])  July 12, 2010 9:05 AM

## 2010-09-07 NOTE — Progress Notes (Signed)
Summary: Rf Temazepam  Phone Note Refill Request Message from:  Fax from Pharmacy  Refills Requested: Medication #1:  TEMAZEPAM 30 MG CAPS 1 at bedtime prn   Supply Requested: 30   Last Refilled: 01/30/2010  Method Requested: Telephone to Pharmacy Initial call taken by: Lanier Prude, [facility]),  March 01, 2010 2:30 PM  Follow-up for Phone Call        ok 6 ref Follow-up by: Tresa Garter MD,  March 01, 2010 10:06 PM  Additional Follow-up for Phone Call Additional follow up Details #1::        Rx called to pharmacy Additional Follow-up by: Lanier Prude, [facility]),  March 02, 2010 10:51 AM    Prescriptions: TEMAZEPAM 30 MG CAPS (TEMAZEPAM) 1 at bedtime prn  #30 x 6   Entered by:   Lanier Prude, [facility])   Authorized by:   Tresa Garter MD   Signed by:   Lanier Prude, CMA(AAMA) on 03/02/2010   Method used:   Telephoned to ...       [facility]  [address] [phone]* (retail)       [address]       [place], [place]  [address]       Ph: [phone] or [phone]       Fax: [phone]   RxID:   [id number]

## 2010-09-07 NOTE — Assessment & Plan Note (Signed)
Summary: 2 MO ROV /  Natale Milch  #  rs'd per pt   Vital Signs:  Patient profile:   77 year old female Height:      64 inches Weight:      112 pounds BMI:     19.29 O2 Sat:      95 % on Room air Temp:     98.3 degrees F oral Pulse rate:   68 / minute Pulse rhythm:   regular Resp:     16 per minute BP sitting:   108 / 70  (left arm) Cuff size:   regular  Vitals Entered By: Lanier Prude, CMA(AAMA) (March 05, 2010 8:04 AM)  O2 Flow:  Room air CC: 2 mo f/u Is Patient Diabetic? No   CC:  2 mo f/u.  History of Present Illness: The patient presents for a follow up of back pain, anxiety, depression and headaches. C/o gas in intestines x 2 wks  Allergies: 1)  ! Cipro (Ciprofloxacin Hcl) 2)  ! Neurontin (Gabapentin) 3)  ! Effexor 4)  ! Evista (Raloxifene Hcl)  Past History:  Social History: Last updated: 02/22/2007 Single Former Smoker Regular exercise-no  Past Medical History: Colon cancer, hx of Depression Osteoporosis Pt declined rx Anemia-NOS Low back pain Nephrolithiasis, hx of Retroperitoneal stenosis Ureteral stenosis B urethral tubes changed q 4 wks Vit D def Vit B12 def  Review of Systems  The patient denies anorexia, dyspnea on exertion, abdominal pain, and melena.    Physical Exam  General:  Thin Ears:  External ear exam shows no significant lesions or deformities.  Otoscopic examination reveals clear canals, tympanic membranes are intact bilaterally without bulging, retraction, inflammation or discharge. Hearing is grossly normal bilaterally. Nose:  External nasal examination shows no deformity or inflammation. Nasal mucosa are pink and moist without lesions or exudates. Mouth:  Oral mucosa and oropharynx without lesions or exudates.  Teeth in good repair. Lungs:  CTA Heart:  RRR Abdomen:  NT Colostomy bag is present Msk:  Lumbar-sacral spine is tender to palpation over paraspinal muscles and painfull with the ROM  Neurologic:  No cranial nerve  deficits noted. Station and gait are normal. Plantar reflexes are down-going bilaterally. DTRs are symmetrical throughout. Sensory, motor and coordinative functions appear intact. Skin:  Intact without suspicious lesions or rashes Psych:  Less  depressed affect.     Impression & Recommendations:  Problem # 1:  ABDOMINAL PAIN (ICD-789.00) Assessment Unchanged On Rx  Problem # 2:  VITAMIN B12 DEFICIENCY (ICD-266.2) Assessment: Comment Only On Rx  Problem # 3:  VITAMIN D DEFICIENCY (ICD-268.9) Assessment: Unchanged On Rx  Problem # 4:  OSTEOPOROSIS (ICD-733.00) Assessment: Comment Only She declined other treatment options  Problem # 5:  FLATULENCE (ICD-787.3) Assessment: Comment Only Flagyl/Align to try Orders: Prescription Created Electronically [phone])  Complete Medication List: 1)  Oxycodone-acetaminophen 10-325 Mg Tabs (Oxycodone-acetaminophen) .Marland Kitchen.. 1 by mouth qid as needed. please,  fill on or after   04/08/10        . ok to fill 1 day earlier when the month has 31 days in it. 2)  Mirtazapine 15 Mg Tbdp (Mirtazapine) .Marland Kitchen.. 1 by mouth qhs 3)  Oxytrol 3.9 Mg/24hr Pttw (Oxybutynin) .... Q 3  d 4)  Vitamin D3 1000 Unit Tabs (Cholecalciferol) .... 2-3 tabs by mouth daily 5)  Vitamin B-12 1000 Mcg Tabs (Cyanocobalamin) .... 2 once daily sl 6)  Temazepam 30 Mg Caps (Temazepam) .Marland Kitchen.. 1 at bedtime prn 7)  Iron 65mg   .Marland KitchenMarland KitchenMarland Kitchen  Once daily 8)  Metronidazole 250 Mg Tabs (Metronidazole) .Marland Kitchen.. 1 by mouth qid 9)  Align Caps (Probiotic product) .Marland Kitchen.. 1 by mouth once daily for your intesinal flora restoraion  Patient Instructions: 1)  Please schedule a follow-up appointment in 2 months well w/labs. Prescriptions: OXYCODONE-ACETAMINOPHEN 10-325 MG  TABS (OXYCODONE-ACETAMINOPHEN) 1 by mouth qid as needed. Please,  fill on or after   04/08/10        . OK to fill 1 day earlier when the month has 31 days in it.  #120 x 0   Entered and Authorized by:   Tresa Garter MD   Signed by:   Tresa Garter MD on 03/05/2010   Method used:   Print then Give to Patient   RxID:   [id number] OXYCODONE-ACETAMINOPHEN 10-325 MG  TABS (OXYCODONE-ACETAMINOPHEN) 1 by mouth qid as needed. Please,  fill on or after   03/08/10        . OK to fill 1 day earlier when the month has 31 days in it.  #120 x 0   Entered and Authorized by:   Tresa Garter MD   Signed by:   Tresa Garter MD on 03/05/2010   Method used:   Print then Give to Patient   RxID:   [id number] ALIGN  CAPS (PROBIOTIC PRODUCT) 1 by mouth once daily for your intesinal flora restoraion  #30 x 1   Entered and Authorized by:   Tresa Garter MD   Signed by:   Tresa Garter MD on 03/05/2010   Method used:   Print then Give to Patient   RxID:   [id number] METRONIDAZOLE 250 MG TABS (METRONIDAZOLE) 1 by mouth qid  #40 x 1   Entered and Authorized by:   Tresa Garter MD   Signed by:   Tresa Garter MD on 03/05/2010   Method used:   [facility] to        [facility] [phone]* (retail)       [address]       [place], [place]  [address]       Ph: [phone] or [phone]       Fax: [phone]   RxID:   [id number]

## 2010-09-07 NOTE — Assessment & Plan Note (Signed)
Summary: 2 MO ROV /NWS   Vital Signs:  Patient profile:   77 year old female Height:      64 inches Weight:      110 pounds BMI:     18.95 Temp:     98.6 degrees F oral Pulse rate:   76 / minute Pulse rhythm:   regular Resp:     16 per minute BP sitting:   102 / 70  (left arm) Cuff size:   regular  Vitals Entered By: Lanier Prude, CMA(AAMA) (July 09, 2010 8:03 AM) CC: 2 mo f/u c/o abd cramps and bloating since lastnight Is Patient Diabetic? No Comments pt is holding Iron currently   CC:  2 mo f/u c/o abd cramps and bloating since lastnight.  History of Present Illness: The patient presents for a follow up of abd pain, anxiety, depression and headaches. She is having a flare up of abd pain She has put iron on hold 1 month ago...  Current Medications (verified): 1)  Oxycodone-Acetaminophen 10-325 Mg  Tabs (Oxycodone-Acetaminophen) .Marland Kitchen.. 1 By Mouth Qid As Needed. Please,  Fill On or After   06/08/10        . Ok To Fill 1 Day Earlier When The Month Has 31 Days in It. 2)  Mirtazapine 15 Mg  Tbdp (Mirtazapine) .Marland Kitchen.. 1 By Mouth Qhs 3)  Oxytrol 3.9 Mg/24hr  Pttw (Oxybutynin) .... Q 3  D 4)  Vitamin D3 1000 Unit  Tabs (Cholecalciferol) .... 2-3 Tabs By Mouth Daily 5)  Vitamin B-12 1000 Mcg  Tabs (Cyanocobalamin) .... 2 Once Daily Sl 6)  Temazepam 30 Mg Caps (Temazepam) .Marland Kitchen.. 1 At Bedtime Prn 7)  Iron 65mg  .... Once Daily 8)  Align  Caps (Probiotic Product) .Marland Kitchen.. 1 By Mouth Once Daily For Your Intesinal Flora Restoraion  Allergies (verified): 1)  ! Cipro (Ciprofloxacin Hcl) 2)  ! Neurontin (Gabapentin) 3)  ! Effexor 4)  ! Evista (Raloxifene Hcl)  Past History:  Past Medical History: Last updated: 03/05/2010 Colon cancer, hx of Depression Osteoporosis Pt declined rx Anemia-NOS Low back pain Nephrolithiasis, hx of Retroperitoneal stenosis Ureteral stenosis B urethral tubes changed q 4 wks Vit D def Vit B12 def  Social History: Last updated: 02/22/2007 Single Former  Smoker Regular exercise-no  Physical Exam  General:  Thin Ears:  External ear exam shows no significant lesions or deformities.  Otoscopic examination reveals clear canals, tympanic membranes are intact bilaterally without bulging, retraction, inflammation or discharge. Hearing is grossly normal bilaterally. Nose:  External nasal examination shows no deformity or inflammation. Nasal mucosa are pink and moist without lesions or exudates. Mouth:  Oral mucosa and oropharynx without lesions or exudates.  Teeth in good repair. Lungs:  CTA Heart:  RRR Abdomen:  NT Colostomy bag is present Msk:  Lumbar-sacral spine is tender to palpation over paraspinal muscles and painfull with the ROM  Extremities:  No clubbing, cyanosis, edema, or deformity noted with normal full range of motion of all joints.   Neurologic:  No cranial nerve deficits noted. Station and gait are normal. Plantar reflexes are down-going bilaterally. DTRs are symmetrical throughout. Sensory, motor and coordinative functions appear intact. Skin:  Intact without suspicious lesions or rashes Psych:  Less  depressed affect.     Impression & Recommendations:  Problem # 1:  WEIGHT LOSS (ICD-783.21) Assessment Improved  Problem # 2:  ABDOMINAL PAIN (ICD-789.00) - flar-up x 2 d Assessment: Deteriorated  Problem # 3:  COLOSTOMY STATUS (ICD-V44.3) Assessment: Unchanged  Problem # 4:  VITAMIN D DEFICIENCY (ICD-268.9)  Problem # 5:  DEPRESSION (ICD-311) Assessment: Unchanged  Her updated medication list for this problem includes:    Mirtazapine 15 Mg Tbdp (Mirtazapine) .Marland Kitchen... 1 by mouth qhs  Complete Medication List: 1)  Oxycodone-acetaminophen 10-325 Mg Tabs (Oxycodone-acetaminophen) .Marland Kitchen.. 1 by mouth qid as needed. please,  fill on or after   08/08/10        . ok to fill 1 day earlier when the month has 31 days in it. 2)  Mirtazapine 15 Mg Tbdp (Mirtazapine) .Marland Kitchen.. 1 by mouth qhs 3)  Oxytrol 3.9 Mg/24hr Pttw (Oxybutynin) .... Q 3   d 4)  Vitamin D3 1000 Unit Tabs (Cholecalciferol) .... 2-3 tabs by mouth daily 5)  Vitamin B-12 1000 Mcg Tabs (Cyanocobalamin) .... 2 once daily sl 6)  Temazepam 30 Mg Caps (Temazepam) .Marland Kitchen.. 1 at bedtime prn 7)  Iron 65mg   .... Once daily 8)  Align Caps (Probiotic product) .Marland Kitchen.. 1 by mouth once daily for your intesinal flora restoraion  Patient Instructions: 1)  Please schedule a follow-up appointment in 2 months. 2)  BMP prior to visit, ICD-9: 3)  Hepatic Panel prior to visit, ICD-9: 4)  CBC w/ Diff prior to visit, ICD-9: 5)  iron/tibc 6)  vit b12 266.20 280.9 Prescriptions: OXYCODONE-ACETAMINOPHEN 10-325 MG  TABS (OXYCODONE-ACETAMINOPHEN) 1 by mouth qid as needed. Please,  fill on or after   08/08/10        . OK to fill 1 day earlier when the month has 31 days in it.  #120 x 0   Entered and Authorized by:   Tresa Garter MD   Signed by:   Tresa Garter MD on 07/09/2010   Method used:   Print then Give to Patient   RxID:   [id number] OXYCODONE-ACETAMINOPHEN 10-325 MG  TABS (OXYCODONE-ACETAMINOPHEN) 1 by mouth qid as needed. Please,  fill on or after   07/08/10        . OK to fill 1 day earlier when the month has 31 days in it.  #120 x 0   Entered and Authorized by:   Tresa Garter MD   Signed by:   Tresa Garter MD on 07/09/2010   Method used:   Print then Give to Patient   RxID:   [id number]    Orders Added: 1)  Est. Patient Level IV [62952]

## 2010-09-07 NOTE — Progress Notes (Signed)
Summary: FYI  Phone Note Other Incoming   Caller: [facility] Summary of Call: FYI:  pt came in today to have labs drawn and refused to give urine specimen.   Initial call taken by: Lanier Prude, [facility]),  April 29, 2010 9:25 AM  Follow-up for Phone Call        ok Follow-up by: Tresa Garter MD,  April 29, 2010 11:59 AM

## 2010-09-07 NOTE — Assessment & Plan Note (Signed)
Summary: 2 MO ROV /NWS   Vital Signs:  Patient profile:   77 year old female Height:      64 inches Weight:      111.25 pounds BMI:     19.17 O2 Sat:      96 % on Room air Temp:     97.5 degrees F oral Pulse rate:   73 / minute BP sitting:   128 / 64  (left arm) Cuff size:   regular  Vitals Entered By: Lucious Groves (January 08, 2010 8:46 AM)  O2 Flow:  Room air CC: 2 mo rtn ov./kb Is Patient Diabetic? No Pain Assessment Patient in pain? no        CC:  2 mo rtn ov./kb.  History of Present Illness: The patient presents for a follow up of abd. pain, anxiety, depression and wt loss. She just had a root canal done.   Current Medications (verified): 1)  Oxycodone-Acetaminophen 10-325 Mg  Tabs (Oxycodone-Acetaminophen) .Marland Kitchen.. 1 By Mouth Qid As Needed. Please,  Fill On or After   12/06/09        . Ok To Fill 1 Day Earlier When The Month Has 31 Days in It. 2)  Mirtazapine 15 Mg  Tbdp (Mirtazapine) .Marland Kitchen.. 1 By Mouth Qhs 3)  Oxytrol 3.9 Mg/24hr  Pttw (Oxybutynin) .... Q 3  D 4)  Vitamin D3 1000 Unit  Tabs (Cholecalciferol) .... 2-3 Tabs By Mouth Daily 5)  Vitamin B-12 1000 Mcg  Tabs (Cyanocobalamin) .... 2 Once Daily Sl 6)  Temazepam 30 Mg Caps (Temazepam) .Marland Kitchen.. 1 At Bedtime Prn 7)  Iron 65mg  .... Once Daily 8)  Amoxicillin .... As Directed Temporarily  Allergies (verified): 1)  ! Cipro (Ciprofloxacin Hcl) 2)  ! Neurontin (Gabapentin) 3)  ! Effexor 4)  ! Evista (Raloxifene Hcl)  Past History:  Past Medical History: Last updated: 09/09/2009 Colon cancer, hx of Depression Osteoporosis Anemia-NOS Low back pain Nephrolithiasis, hx of Retroperitoneal stenosis Ureteral stenosis B urethral tubes changed q 4 wks Vit D def Vit B12 def  Social History: Last updated: 02/22/2007 Single Former Smoker Regular exercise-no  Review of Systems       The patient complains of weight loss and abdominal pain.    Physical Exam  General:  Thin Ears:  External ear exam shows no  significant lesions or deformities.  Otoscopic examination reveals clear canals, tympanic membranes are intact bilaterally without bulging, retraction, inflammation or discharge. Hearing is grossly normal bilaterally. Mouth:  Oral mucosa and oropharynx without lesions or exudates.  Teeth in good repair. Lungs:  CTA Heart:  RRR Abdomen:  NT Colostomy bag is present Msk:  Lumbar-sacral spine is tender to palpation over paraspinal muscles and painfull with the ROM  Extremities:  No clubbing, cyanosis, edema, or deformity noted with normal full range of motion of all joints.   Neurologic:  No cranial nerve deficits noted. Station and gait are normal. Plantar reflexes are down-going bilaterally. DTRs are symmetrical throughout. Sensory, motor and coordinative functions appear intact.   Impression & Recommendations:  Problem # 1:  ABDOMINAL PAIN (ICD-789.00) Assessment Unchanged On prescription drug  therapy  - given Rxs  Problem # 2:  VITAMIN B12 DEFICIENCY (ICD-266.2) Assessment: Unchanged On prescription drug  therapy   Problem # 3:  VITAMIN D DEFICIENCY (ICD-268.9) Assessment: Unchanged On Rx  Problem # 4:  OSTEOPOROSIS (ICD-733.00) Assessment: Comment Only Discussed. She declined BDS. Prolia discussed.  Complete Medication List: 1)  Oxycodone-acetaminophen 10-325 Mg Tabs (Oxycodone-acetaminophen) .Marland KitchenMarland KitchenMarland Kitchen  1 by mouth qid as needed. please,  fill on or after   02/05/10        . ok to fill 1 day earlier when the month has 31 days in it. 2)  Mirtazapine 15 Mg Tbdp (Mirtazapine) .Marland Kitchen.. 1 by mouth qhs 3)  Oxytrol 3.9 Mg/24hr Pttw (Oxybutynin) .... Q 3  d 4)  Vitamin D3 1000 Unit Tabs (Cholecalciferol) .... 2-3 tabs by mouth daily 5)  Vitamin B-12 1000 Mcg Tabs (Cyanocobalamin) .... 2 once daily sl 6)  Temazepam 30 Mg Caps (Temazepam) .Marland Kitchen.. 1 at bedtime prn 7)  Iron 65mg   .... Once daily 8)  Amoxicillin  .... As directed temporarily  Patient Instructions: 1)  Please schedule a follow-up  appointment in 2 months. 2)  BMP prior to visit, ICD-9: 3)  Hepatic Panel prior to visit, ICD-9: 4)  TSH prior to visit, ICD-9: 995.20 5)  CBC w/ Diff prior to visit, ICD-9: Prescriptions: OXYCODONE-ACETAMINOPHEN 10-325 MG  TABS (OXYCODONE-ACETAMINOPHEN) 1 by mouth qid as needed. Please,  fill on or after   02/05/10        . OK to fill 1 day earlier when the month has 31 days in it.  #120 x 0   Entered and Authorized by:   Tresa Garter MD   Signed by:   Tresa Garter MD on 01/08/2010   Method used:   Print then Give to Patient   RxID:   [id number] OXYCODONE-ACETAMINOPHEN 10-325 MG  TABS (OXYCODONE-ACETAMINOPHEN) 1 by mouth qid as needed. Please,  fill on or after   01/06/10        . OK to fill 1 day earlier when the month has 31 days in it.  #120 x 0   Entered and Authorized by:   Tresa Garter MD   Signed by:   Tresa Garter MD on 01/08/2010   Method used:   Print then Give to Patient   RxID:   [id number]

## 2010-09-07 NOTE — Assessment & Plan Note (Signed)
Summary: 2 MTH FU STC   Vital Signs:  Patient profile:   77 year old female Weight:      108 pounds BMI:     18.61 Temp:     97.9 degrees F oral Pulse rate:   68 / minute BP sitting:   106 / 56  (left arm)  Vitals Entered By: Tora Perches (September 09, 2009 9:50 AM) CC: f/u Is Patient Diabetic? No   CC:  f/u.  History of Present Illness: The patient presents for a follow up of abd. and back pain, anxiety, depression.   Preventive Screening-Counseling & Management  Alcohol-Tobacco     Smoking Status: quit  Current Medications (verified): 1)  Mirtazapine 15 Mg  Tbdp (Mirtazapine) .Marland Kitchen.. 1 By Mouth Qhs 2)  Oxytrol 3.9 Mg/24hr  Pttw (Oxybutynin) .... Q 3  D 3)  Vitamin D3 1000 Unit  Tabs (Cholecalciferol) .... 2-3 Tabs By Mouth Daily 4)  Vitamin B-12 1000 Mcg  Tabs (Cyanocobalamin) .... 2 Once Daily Sl 5)  Temazepam 30 Mg Caps (Temazepam) .Marland Kitchen.. 1 At Bedtime Prn 6)  Oxycodone-Acetaminophen 10-325 Mg  Tabs (Oxycodone-Acetaminophen) .Marland Kitchen.. 1 By Mouth Qid As Needed. Please,  Fill On or After   08/08/09        . Ok To Fill 1 Day Earlier When The Month Has 31 Days in It. 7)  Levsin 0.125 Mg Tabs (Hyoscyamine Sulfate) .Marland Kitchen.. 1 By Mouth 2 Times Daily As Needed For Abdominal Pain 8)  Iron 65mg  .... Once Daily  Allergies: 1)  ! Cipro (Ciprofloxacin Hcl) 2)  ! Neurontin (Gabapentin) 3)  ! Effexor 4)  ! Evista (Raloxifene Hcl)  Past History:  Past Surgical History: Last updated: 02/18/2008 Hysterectomy Colectomy Colostomy B nephrostomy B 2008 Dr Serena Colonel  Social History: Last updated: 02/22/2007 Single Former Smoker Regular exercise-no  Past Medical History: Colon cancer, hx of Depression Osteoporosis Anemia-NOS Low back pain Nephrolithiasis, hx of Retroperitoneal stenosis Ureteral stenosis B urethral tubes changed q 4 wks Vit D def Vit B12 def  Physical Exam  General:  Thin Mouth:  Oral mucosa and oropharynx without lesions or exudates.  Teeth in good  repair. Lungs:  CTA Heart:  RRR Abdomen:  NT Colostomy bag is present Msk:  Lumbar-sacral spine is tender to palpation over paraspinal muscles and painfull with the ROM  Extremities:  No clubbing, cyanosis, edema, or deformity noted with normal full range of motion of all joints.   Neurologic:  No cranial nerve deficits noted. Station and gait are normal. Plantar reflexes are down-going bilaterally. DTRs are symmetrical throughout. Sensory, motor and coordinative functions appear intact. Skin:  Intact without suspicious lesions or rashes Psych:  Less  depressed affect.     Impression & Recommendations:  Problem # 1:  ABDOMINAL PAIN (ICD-789.00) Assessment Unchanged On On prescription therapy  Orders: Prescription Created Electronically [phone])  Problem # 2:  DEPRESSION (ICD-311) Assessment: Unchanged  Her updated medication list for this problem includes:    Mirtazapine 15 Mg Tbdp (Mirtazapine) .Marland Kitchen... 1 by mouth qhs  Problem # 3:  LOW BACK PAIN (ICD-724.2) Assessment: Unchanged  Her updated medication list for this problem includes:    Oxycodone-acetaminophen 10-325 Mg Tabs (Oxycodone-acetaminophen) .Marland Kitchen... 1 by mouth qid as needed. please,  fill on or after   10/06/09        . ok to fill 1 day earlier when the month has 31 days in it.  Problem # 4:  ANEMIA-NOS (ICD-285.9)  Her updated medication list for this problem  includes:    Vitamin B-12 1000 Mcg Tabs (Cyanocobalamin) .Marland Kitchen... 2 once daily sl  Complete Medication List: 1)  Mirtazapine 15 Mg Tbdp (Mirtazapine) .Marland Kitchen.. 1 by mouth qhs 2)  Oxytrol 3.9 Mg/24hr Pttw (Oxybutynin) .... Q 3  d 3)  Vitamin D3 1000 Unit Tabs (Cholecalciferol) .... 2-3 tabs by mouth daily 4)  Vitamin B-12 1000 Mcg Tabs (Cyanocobalamin) .... 2 once daily sl 5)  Temazepam 30 Mg Caps (Temazepam) .Marland Kitchen.. 1 at bedtime prn 6)  Oxycodone-acetaminophen 10-325 Mg Tabs (Oxycodone-acetaminophen) .Marland Kitchen.. 1 by mouth qid as needed. please,  fill on or after   10/06/09        .  ok to fill 1 day earlier when the month has 31 days in it. 7)  Levsin 0.125 Mg Tabs (Hyoscyamine sulfate) .Marland Kitchen.. 1 by mouth 2 times daily as needed for abdominal pain 8)  Iron 65mg   .... Once daily  Patient Instructions: 1)  Please schedule a follow-up appointment in 2 months. 2)  BMP prior to visit, ICD-9: 3)  Hepatic Panel prior to visit, ICD-9: 4)  TSH prior to visit, ICD-9: 5)  Vit B12 266.20  995.20 6)  CBC w/ Diff prior to visit, ICD-9: Prescriptions: TEMAZEPAM 30 MG CAPS (TEMAZEPAM) 1 at bedtime prn  #30 x 5   Entered and Authorized by:   Tresa Garter MD   Signed by:   Tresa Garter MD on 09/09/2009   Method used:   Print then Give to Patient   RxID:   [id number] MIRTAZAPINE 15 MG  TBDP (MIRTAZAPINE) 1 by mouth qhs  #30 x 6   Entered and Authorized by:   Tresa Garter MD   Signed by:   Tresa Garter MD on 09/09/2009   Method used:   Electronically to        [facility] [phone]* (retail)       [address]       [place], [place]  [address]       Ph: [phone] or [phone]       Fax: [phone]   RxID:   [id number] OXYCODONE-ACETAMINOPHEN 10-325 MG  TABS (OXYCODONE-ACETAMINOPHEN) 1 by mouth qid as needed. Please,  fill on or after   10/06/09        . OK to fill 1 day earlier when the month has 31 days in it.  #120 x 0   Entered and Authorized by:   Tresa Garter MD   Signed by:   Tresa Garter MD on 09/09/2009   Method used:   Print then Give to Patient   RxID:   [id number] OXYCODONE-ACETAMINOPHEN 10-325 MG  TABS (OXYCODONE-ACETAMINOPHEN) 1 by mouth qid as needed. Please,  fill on or after   09/08/09        . OK to fill 1 day earlier when the month has 31 days in it.  #120 x 0   Entered and Authorized by:   Tresa Garter MD   Signed by:   Tresa Garter MD on 09/09/2009   Method used:   Print then Give to Patient   RxID:   [id number]

## 2010-09-09 NOTE — Progress Notes (Signed)
Summary: Rf temazepam  Phone Note Refill Request Message from:  Fax from Pharmacy  Refills Requested: Medication #1:  TEMAZEPAM 30 MG CAPS 1 at bedtime prn   Dosage confirmed as above?Dosage Confirmed   Supply Requested: 30   Last Refilled: 08/02/2010  Method Requested: Telephone to Pharmacy Next Appointment Scheduled: 09-15-10 Initial call taken by: Lanier Prude, [facility]),  September 02, 2010 9:14 AM  Follow-up for Phone Call        ok 3 ref Follow-up by: Tresa Garter MD,  September 02, 2010 12:38 PM  Additional Follow-up for Phone Call Additional follow up Details #1::        Rx called to pharmacy Additional Follow-up by: Lanier Prude, [facility]),  September 02, 2010 2:09 PM    Prescriptions: TEMAZEPAM 30 MG CAPS (TEMAZEPAM) 1 at bedtime prn  #30 x 3   Entered by:   Lanier Prude, [facility])   Authorized by:   Tresa Garter MD   Signed by:   Lanier Prude, CMA(AAMA) on 09/02/2010   Method used:   Telephoned to ...       [facility]  [address] [phone]* (retail)       [address]       [place], [place]  [address]       Ph: [phone] or [phone]       Fax: [phone]   RxID:   [id number]

## 2010-09-15 ENCOUNTER — Other Ambulatory Visit: Payer: Self-pay | Admitting: Internal Medicine

## 2010-09-15 ENCOUNTER — Ambulatory Visit (INDEPENDENT_AMBULATORY_CARE_PROVIDER_SITE_OTHER): Payer: Medicare Other | Admitting: Internal Medicine

## 2010-09-15 ENCOUNTER — Encounter: Payer: Self-pay | Admitting: Internal Medicine

## 2010-09-15 ENCOUNTER — Encounter (INDEPENDENT_AMBULATORY_CARE_PROVIDER_SITE_OTHER): Payer: Self-pay | Admitting: *Deleted

## 2010-09-15 ENCOUNTER — Other Ambulatory Visit: Payer: Medicare Other

## 2010-09-15 DIAGNOSIS — F329 Major depressive disorder, single episode, unspecified: Secondary | ICD-10-CM

## 2010-09-15 DIAGNOSIS — E538 Deficiency of other specified B group vitamins: Secondary | ICD-10-CM

## 2010-09-15 DIAGNOSIS — M810 Age-related osteoporosis without current pathological fracture: Secondary | ICD-10-CM

## 2010-09-15 DIAGNOSIS — R109 Unspecified abdominal pain: Secondary | ICD-10-CM

## 2010-09-15 DIAGNOSIS — M545 Low back pain, unspecified: Secondary | ICD-10-CM

## 2010-09-15 DIAGNOSIS — R634 Abnormal weight loss: Secondary | ICD-10-CM

## 2010-09-15 DIAGNOSIS — R209 Unspecified disturbances of skin sensation: Secondary | ICD-10-CM | POA: Insufficient documentation

## 2010-09-15 LAB — CBC WITH DIFFERENTIAL/PLATELET
Basophils Relative: 0 % (ref 0.0–3.0)
Eosinophils Relative: 2 % (ref 0.0–5.0)
HCT: 41.5 % (ref 36.0–46.0)
Hemoglobin: 14.1 g/dL (ref 12.0–15.0)
Lymphs Abs: 0.8 10*3/uL (ref 0.7–4.0)
Monocytes Relative: 6.3 % (ref 3.0–12.0)
Neutro Abs: 4.5 10*3/uL (ref 1.4–7.7)
RBC: 4.44 Mil/uL (ref 3.87–5.11)
WBC: 5.8 10*3/uL (ref 4.5–10.5)

## 2010-09-15 LAB — VITAMIN B12: Vitamin B-12: 1095 pg/mL — ABNORMAL HIGH (ref 211–911)

## 2010-09-15 LAB — HEPATIC FUNCTION PANEL
AST: 17 U/L (ref 0–37)
Albumin: 4 g/dL (ref 3.5–5.2)
Alkaline Phosphatase: 106 U/L (ref 39–117)
Bilirubin, Direct: 0.1 mg/dL (ref 0.0–0.3)
Total Protein: 7.1 g/dL (ref 6.0–8.3)

## 2010-09-15 LAB — SEDIMENTATION RATE: Sed Rate: 20 mm/hr (ref 0–22)

## 2010-09-15 LAB — CONVERTED CEMR LAB: Vit D, 25-Hydroxy: 30 ng/mL (ref 30–89)

## 2010-09-15 LAB — BASIC METABOLIC PANEL
BUN: 18 mg/dL (ref 6–23)
Calcium: 10.6 mg/dL — ABNORMAL HIGH (ref 8.4–10.5)
Creatinine, Ser: 1.2 mg/dL (ref 0.4–1.2)

## 2010-09-23 NOTE — Assessment & Plan Note (Signed)
Summary: 2 MO ROA/NWS   Vital Signs:  Patient profile:   77 year old female Height:      64 inches Weight:      105 pounds BMI:     18.09 Temp:     98.5 degrees F oral Pulse rate:   68 / minute Pulse rhythm:   regular Resp:     16 per minute BP sitting:   100 / 62  (left arm) Cuff size:   regular  Vitals Entered By: Lanier Prude, CMA(AAMA) (September 15, 2010 8:05 AM) CC: 2 mo f/u c/o back pain Is Patient Diabetic? No   CC:  2 mo f/u c/o back pain.  History of Present Illness: The patient presents for a follow up of abd pain, anxiety, depression. C/o LBP on R since Friday - bad C/o numbness L 4-5th finger since this AM  Current Medications (verified): 1)  Oxycodone-Acetaminophen 10-325 Mg  Tabs (Oxycodone-Acetaminophen) .Marland Kitchen.. 1 By Mouth Qid As Needed. Please,  Fill On or After   08/08/10        . Ok To Fill 1 Day Earlier When The Month Has 31 Days in It. 2)  Mirtazapine 15 Mg  Tbdp (Mirtazapine) .Marland Kitchen.. 1 By Mouth Qhs 3)  Oxytrol 3.9 Mg/24hr  Pttw (Oxybutynin) .... Q 3  D 4)  Vitamin D3 1000 Unit  Tabs (Cholecalciferol) .... 2-3 Tabs By Mouth Daily 5)  Vitamin B-12 1000 Mcg  Tabs (Cyanocobalamin) .... 2 Once Daily Sl 6)  Temazepam 30 Mg Caps (Temazepam) .Marland Kitchen.. 1 At Bedtime Prn 7)  Iron 65mg  .... Once Daily 8)  Align  Caps (Probiotic Product) .Marland Kitchen.. 1 By Mouth Once Daily For Your Intesinal Flora Restoraion  Allergies (verified): 1)  ! Cipro (Ciprofloxacin Hcl) 2)  ! Neurontin (Gabapentin) 3)  ! Effexor 4)  ! Evista (Raloxifene Hcl)  Past History:  Past Medical History: Last updated: 03/05/2010 Colon cancer, hx of Depression Osteoporosis Pt declined rx Anemia-NOS Low back pain Nephrolithiasis, hx of Retroperitoneal stenosis Ureteral stenosis B urethral tubes changed q 4 wks Vit D def Vit B12 def  Social History: Last updated: 02/22/2007 Single Former Smoker Regular exercise-no  Review of Systems       The patient complains of difficulty walking.  The patient  denies weight loss and dyspnea on exertion.    Physical Exam  General:  Thin Eyes:  No corneal or conjunctival inflammation noted. EOMI. Perrla. Mouth:  Oral mucosa and oropharynx without lesions or exudates.  Teeth in good repair. Lungs:  CTA Heart:  RRR Abdomen:  NT Colostomy bag is present Msk:  Lumbar-sacral spine is not tender to palpation over paraspinal muscles and painfull with the ROM R SI joint is tender w/ROM and palpation Hands OK, MS ok  Neurologic:  No cranial nerve deficits noted. Station and gait are normal. Plantar reflexes are down-going bilaterally. DTRs are symmetrical throughout. Sensory, motor and coordinative functions appear intact. Skin:  Intact without suspicious lesions or rashes Psych:  Less  depressed affect.     Impression & Recommendations:  Problem # 1:  LOW BACK PAIN (ICD-724.2) R SI Assessment Unchanged  Her updated medication list for this problem includes:    Oxycodone-acetaminophen 10-325 Mg Tabs (Oxycodone-acetaminophen) .Marland Kitchen... 1 by mouth qid as needed. please,  fill on or after   10/07/10        ok to fill 1 day earlier when the month has 31 days in it.    Ibuprofen 600 Mg Tabs (Ibuprofen) .Marland KitchenMarland KitchenMarland KitchenMarland Kitchen 1  by mouth bid  pc x 1 wk then as needed for  pain  Orders: TLB-B12, Serum-Total ONLY (16109-U04) TLB-CBC Platelet - w/Differential (85025-CBCD) TLB-TSH (Thyroid Stimulating Hormone) (84443-TSH) TLB-Hepatic/Liver Function Pnl (80076-HEPATIC) TLB-Udip ONLY (81003-UDIP)  Problem # 2:  VITAMIN B12 DEFICIENCY (ICD-266.2) Assessment: Unchanged  Orders: TLB-B12, Serum-Total ONLY (54098-J19) TLB-CBC Platelet - w/Differential (85025-CBCD) TLB-TSH (Thyroid Stimulating Hormone) (84443-TSH) TLB-Hepatic/Liver Function Pnl (80076-HEPATIC)  Problem # 3:  DEPRESSION (ICD-311) Assessment: Unchanged  Her updated medication list for this problem includes:    Mirtazapine 15 Mg Tbdp (Mirtazapine) .Marland Kitchen... 1 by mouth qhs  Orders: TLB-B12, Serum-Total ONLY  (14782-N56) TLB-CBC Platelet - w/Differential (85025-CBCD) TLB-TSH (Thyroid Stimulating Hormone) (84443-TSH) TLB-Hepatic/Liver Function Pnl (80076-HEPATIC)  Problem # 4:  WEIGHT LOSS (ICD-783.21) Assessment: Deteriorated  Orders: TLB-B12, Serum-Total ONLY (21308-M57) TLB-CBC Platelet - w/Differential (85025-CBCD) TLB-TSH (Thyroid Stimulating Hormone) (84443-TSH) TLB-Hepatic/Liver Function Pnl (80076-HEPATIC)  Problem # 5:  PARESTHESIA (ICD-782.0) L ulnar nerve distr - due to pressuer during sleep this am Assessment: New Check B12  Complete Medication List: 1)  Oxycodone-acetaminophen 10-325 Mg Tabs (Oxycodone-acetaminophen) .Marland Kitchen.. 1 by mouth qid as needed. please,  fill on or after   10/07/10        . ok to fill 1 day earlier when the month has 31 days in it. 2)  Mirtazapine 15 Mg Tbdp (Mirtazapine) .Marland Kitchen.. 1 by mouth qhs 3)  Oxytrol 3.9 Mg/24hr Pttw (Oxybutynin) .... Q 3  d 4)  Vitamin D3 1000 Unit Tabs (Cholecalciferol) .... 2-3 tabs by mouth daily 5)  Vitamin B-12 1000 Mcg Tabs (Cyanocobalamin) .... 2 once daily sl 6)  Temazepam 30 Mg Caps (Temazepam) .Marland Kitchen.. 1 at bedtime prn 7)  Iron 65mg   .... Once daily 8)  Align Caps (Probiotic product) .Marland Kitchen.. 1 by mouth once daily for your intesinal flora restoraion 9)  Ibuprofen 600 Mg Tabs (Ibuprofen) .Marland Kitchen.. 1 by mouth bid  pc x 1 wk then as needed for  pain  Other Orders: T-Vitamin D (25-Hydroxy) (84696-29528) TLB-BMP (Basic Metabolic Panel-BMET) (80048-METABOL) TLB-Sedimentation Rate (ESR) (85652-ESR)  Patient Instructions: 1)  Please schedule a follow-up appointment in 2 weeks. 2)  Call if you are not better in a reasonable amount of time or if worse. 3)  Use stretchingexercises that I have provided (15 min. or longer every day)   Prescriptions: OXYCODONE-ACETAMINOPHEN 10-325 MG  TABS (OXYCODONE-ACETAMINOPHEN) 1 by mouth qid as needed. Please,  fill on or after   10/07/10        . OK to fill 1 day earlier when the month has 31 days in it.  #120 x  0   Entered and Authorized by:   Tresa Garter MD   Signed by:   Tresa Garter MD on 09/15/2010   Method used:   Print then Give to Patient   RxID:   [id number] OXYCODONE-ACETAMINOPHEN 10-325 MG  TABS (OXYCODONE-ACETAMINOPHEN) 1 by mouth qid as needed. Please,  fill on or after   09/08/10        . OK to fill 1 day earlier when the month has 31 days in it.  #120 x 0   Entered and Authorized by:   Tresa Garter MD   Signed by:   Tresa Garter MD on 09/15/2010   Method used:   Print then Give to Patient   RxID:   [id number] IBUPROFEN 600 MG TABS (IBUPROFEN) 1 by mouth bid  pc x 1 wk then as needed for  pain  #60 x 3   Entered  and Authorized by:   Tresa Garter MD   Signed by:   Tresa Garter MD on 09/15/2010   Method used:   [facility] to        [facility] [phone][facility] (retail)       [address]       [place], [place]  [address]       Ph: [phone] or [phone]       Fax: [phone]   RxID:   [id number]    Orders Added: 1)  T-Vitamin D (25-Hydroxy) [phone] 2)  Est. Patient Level IV [99214] 3)  TLB-B12, Serum-Total ONLY [82607-B12] 4)  TLB-CBC Platelet - w/Differential [85025-CBCD] 5)  TLB-TSH (Thyroid Stimulating Hormone) [84443-TSH] 6)  TLB-Hepatic/Liver Function Pnl [80076-HEPATIC] 7)  TLB-BMP (Basic Metabolic Panel-BMET) [80048-METABOL] 8)  TLB-Sedimentation Rate (ESR) [85652-ESR]

## 2010-10-05 ENCOUNTER — Encounter: Payer: Self-pay | Admitting: Internal Medicine

## 2010-10-05 ENCOUNTER — Ambulatory Visit (INDEPENDENT_AMBULATORY_CARE_PROVIDER_SITE_OTHER): Payer: Medicare Other | Admitting: Internal Medicine

## 2010-10-05 DIAGNOSIS — R141 Gas pain: Secondary | ICD-10-CM

## 2010-10-05 DIAGNOSIS — E538 Deficiency of other specified B group vitamins: Secondary | ICD-10-CM

## 2010-10-05 DIAGNOSIS — M545 Low back pain, unspecified: Secondary | ICD-10-CM

## 2010-10-05 DIAGNOSIS — R143 Flatulence: Secondary | ICD-10-CM

## 2010-10-05 DIAGNOSIS — R1084 Generalized abdominal pain: Secondary | ICD-10-CM | POA: Insufficient documentation

## 2010-10-14 NOTE — Assessment & Plan Note (Signed)
Summary: 2 wk f/u   Vital Signs:  Patient profile:   77 year old female Height:      64 inches (162.56 cm) Weight:      108.50 pounds (49.32 kg) BMI:     18.69 O2 Sat:      94 % on Room air Temp:     98.3 degrees F (36.83 degrees C) oral Pulse rate:   65 / minute Resp:     14 per minute BP sitting:   110 / 70  (left arm) Cuff size:   regular  Vitals Entered By: Burnard Leigh CMA(AAMA) (October 05, 2010 8:09 AM)  O2 Flow:  Room air CC: F/U on back pain.Discuss meds/sls,cma Is Patient Diabetic? No Comments Pt would like to discuss episodes of constipation and taking laxative.Is it Ok w/colostomy Pt states she is no longer taking Iron and only taking Align as needed Pt states she needs Rx for refill: Temazepam   CC:  F/U on back pain.Discuss meds/sls and cma.  History of Present Illness: F/u LBP - better with Ibuprofen. C/o constipation and abd pain when there would be no sool in the bag x hrs  and then resolves (2 per month up to 3 d) The patient presents for a follow up of abd pain, anxiety, depression.  Current Medications (verified): 1)  Oxycodone-Acetaminophen 10-325 Mg  Tabs (Oxycodone-Acetaminophen) .Marland Kitchen.. 1 By Mouth Qid As Needed. Please,  Fill On or After   10/07/10        . Ok To Fill 1 Day Earlier When The Month Has 31 Days in It. 2)  Mirtazapine 15 Mg  Tbdp (Mirtazapine) .Marland Kitchen.. 1 By Mouth Qhs 3)  Oxytrol 3.9 Mg/24hr  Pttw (Oxybutynin) .... Q 3  D 4)  Vitamin D3 1000 Unit  Tabs (Cholecalciferol) .... 2-3 Tabs By Mouth Daily 5)  Vitamin B-12 1000 Mcg  Tabs (Cyanocobalamin) .... 2 Once Daily Sl 6)  Temazepam 30 Mg Caps (Temazepam) .Marland Kitchen.. 1 At Bedtime Prn 7)  Iron 65mg  .... Once Daily 8)  Align  Caps (Probiotic Product) .Marland Kitchen.. 1 By Mouth Once Daily For Your Intesinal Flora Restoraion 9)  Ibuprofen 600 Mg Tabs (Ibuprofen) .Marland Kitchen.. 1 By Mouth Bid  Pc X 1 Wk Then As Needed For  Pain  Allergies (verified): 1)  ! Cipro (Ciprofloxacin Hcl) 2)  ! Neurontin (Gabapentin) 3)  !  Effexor 4)  ! Evista (Raloxifene Hcl)  Physical Exam  General:  Thin Ears:  External ear exam shows no significant lesions or deformities.  Otoscopic examination reveals clear canals, tympanic membranes are intact bilaterally without bulging, retraction, inflammation or discharge. Hearing is grossly normal bilaterally. Nose:  External nasal examination shows no deformity or inflammation. Nasal mucosa are pink and moist without lesions or exudates. Mouth:  Oral mucosa and oropharynx without lesions or exudates.  Teeth in good repair. Lungs:  CTA Heart:  RRR Abdomen:  NT Colostomy bag is present Msk:  Lumbar-sacral spine is not tender to palpation over paraspinal muscles and painfull with the ROM R SI joint is tender w/ROM and palpation Hands OK, MS ok  Neurologic:  No cranial nerve deficits noted. Station and gait are normal. Plantar reflexes are down-going bilaterally. DTRs are symmetrical throughout. Sensory, motor and coordinative functions appear intact. Skin:  Intact without suspicious lesions or rashes Psych:  Less  depressed affect.     Impression & Recommendations:  Problem # 1:  ABDOMINAL PAIN, GENERALIZED (ICD-789.07) ?SBO episodes Assessment Deteriorated It has resolved on it's own so  far several times  Problem # 2:  ABDOMINAL DISTENSION (ICD-787.3) as above Assessment: Deteriorated  Problem # 3:  WEIGHT LOSS (ICD-783.21) Assessment: Unchanged  Problem # 4:  LOW BACK PAIN (ICD-724.2) Assessment: Improved  Her updated medication list for this problem includes:    Oxycodone-acetaminophen 10-325 Mg Tabs (Oxycodone-acetaminophen) .Marland Kitchen... 1 by mouth qid as needed. please,  fill on or after   10/07/10        . ok to fill 1 day earlier when the month has 31 days in it.    Ibuprofen 600 Mg Tabs (Ibuprofen) .Marland Kitchen... 1 by mouth bid  pc x 1 wk then as needed for  pain  Complete Medication List: 1)  Oxycodone-acetaminophen 10-325 Mg Tabs (Oxycodone-acetaminophen) .Marland Kitchen.. 1 by mouth qid  as needed. please,  fill on or after   10/07/10        . ok to fill 1 day earlier when the month has 31 days in it. 2)  Mirtazapine 15 Mg Tbdp (Mirtazapine) .Marland Kitchen.. 1 by mouth qhs 3)  Oxytrol 3.9 Mg/24hr Pttw (Oxybutynin) .... Q 3  d 4)  Vitamin D3 1000 Unit Tabs (Cholecalciferol) .... 3 tabs by mouth daily 5)  Vitamin B-12 1000 Mcg Tabs (Cyanocobalamin) .... 2 once daily sl 6)  Temazepam 30 Mg Caps (Temazepam) .Marland Kitchen.. 1 at bedtime prn 7)  Align Caps (Probiotic product) .Marland Kitchen.. 1 by mouth once daily for your intesinal flora restoraion 8)  Ibuprofen 600 Mg Tabs (Ibuprofen) .Marland Kitchen.. 1 by mouth bid  pc x 1 wk then as needed for  pain  Patient Instructions: 1)  Please schedule a follow-up appointment in 2 months. Prescriptions: TEMAZEPAM 30 MG CAPS (TEMAZEPAM) 1 at bedtime prn  #30 x 3   Entered and Authorized by:   Tresa Garter MD   Signed by:   Tresa Garter MD on 10/05/2010   Method used:   Print then Give to Patient   RxID:   [id number]    Orders Added: 1)  Est. Patient Level IV [14782]

## 2010-11-10 ENCOUNTER — Ambulatory Visit (INDEPENDENT_AMBULATORY_CARE_PROVIDER_SITE_OTHER): Payer: Medicare Other | Admitting: Internal Medicine

## 2010-11-10 ENCOUNTER — Encounter: Payer: Self-pay | Admitting: Internal Medicine

## 2010-11-10 DIAGNOSIS — M810 Age-related osteoporosis without current pathological fracture: Secondary | ICD-10-CM

## 2010-11-10 DIAGNOSIS — R1084 Generalized abdominal pain: Secondary | ICD-10-CM

## 2010-11-10 DIAGNOSIS — E538 Deficiency of other specified B group vitamins: Secondary | ICD-10-CM

## 2010-11-10 DIAGNOSIS — F329 Major depressive disorder, single episode, unspecified: Secondary | ICD-10-CM

## 2010-11-10 DIAGNOSIS — R634 Abnormal weight loss: Secondary | ICD-10-CM

## 2010-11-10 MED ORDER — OXYCODONE-ACETAMINOPHEN 10-325 MG PO TABS: ORAL_TABLET | ORAL | Status: DC

## 2010-11-10 MED ORDER — METRONIDAZOLE 250 MG PO TABS: 250.0000 mg | ORAL_TABLET | Freq: Two times a day (BID) | ORAL | Status: AC

## 2010-11-10 NOTE — Assessment & Plan Note (Signed)
Doing better.   

## 2010-11-10 NOTE — Assessment & Plan Note (Signed)
Cont Rx 

## 2010-11-10 NOTE — Assessment & Plan Note (Signed)
Cont Rx  Potential benefits of a long term opioids use as well as potential risks (i.e. addiction risk, apnea etc) and complications (i.e. Somnolence, constipation and others) were explained to the patient and were aknowledged.

## 2010-11-10 NOTE — Assessment & Plan Note (Signed)
Improved. She has been more liberal with diet.Marland KitchenMarland Kitchen

## 2010-11-10 NOTE — Assessment & Plan Note (Signed)
On Vit D 

## 2010-11-10 NOTE — Progress Notes (Signed)
  Subjective:    Patient ID: [name], female    DOB: 03-13-1928, 77 y.o.   MRN: [id number]  HPI   The patient is here to follow up on chronic depression, anxiety, headaches and chronic moderate to severe abdominal pain symptoms controlled with medicines. F/u on wt loss.  Wt Readings from Last 3 Encounters:  11/10/10 109 lb (49.442 kg)  10/05/10 108 lb 8 oz (49.215 kg)  09/15/10 105 lb (47.628 kg)     Review of Systems  Constitutional: Positive for fatigue. Negative for activity change and appetite change.  HENT: Negative for mouth sores, neck pain and postnasal drip.   Eyes: Negative for photophobia and pain.  Respiratory: Negative for shortness of breath.   Gastrointestinal: Positive for abdominal pain (controlled) and abdominal distention. Negative for blood in stool.  Genitourinary: Negative for dysuria and pelvic pain.  Musculoskeletal: Positive for back pain.  Skin: Negative for pallor.  Neurological: Negative for headaches.  Psychiatric/Behavioral: Negative for suicidal ideas.       Objective:   Physical Exam  Constitutional: She appears well-developed.       Thin  HENT:  Head: Normocephalic.  Mouth/Throat: No oropharyngeal exudate.  Eyes: Pupils are equal, round, and reactive to light. Left eye exhibits no discharge.  Neck: No JVD present.  Cardiovascular: Normal rate.  Exam reveals no gallop.   No murmur heard. Pulmonary/Chest: She has no wheezes.  Abdominal: She exhibits no mass. There is tenderness (diffuse).  Musculoskeletal: She exhibits no edema and no tenderness.  Lymphadenopathy:    She has no cervical adenopathy.  Psychiatric: Judgment and thought content normal.       Less depressed          Assessment & Plan:  ABDOMINAL PAIN, GENERALIZED Cont Rx  Potential benefits of a long term opioids use as well as potential risks (i.e. addiction risk, apnea etc) and complications (i.e. Somnolence, constipation and others) were explained to the  patient and were aknowledged.   DEPRESSION Doing better  VITAMIN B12 DEFICIENCY Cont Rx  OSTEOPOROSIS On Vit D  WEIGHT LOSS Improved. She has been more liberal with diet...    FLATULANCE  We can try Flagyl/Align

## 2010-11-12 ENCOUNTER — Other Ambulatory Visit: Payer: Self-pay | Admitting: Internal Medicine

## 2010-12-21 NOTE — Discharge Summary (Signed)
NAMEJINAN, [name]               ACCOUNT NO.:  [id number]   MEDICAL RECORD NO.:  [id number]          PATIENT TYPE:  INP   LOCATION:  6709                         FACILITY:  [facility]   PHYSICIAN:  Sandford Craze, NP DATE OF BIRTH:  January 25, 1928   DATE OF ADMISSION:  12/07/2007  DATE OF DISCHARGE:  12/11/2007                               DISCHARGE SUMMARY   DISCHARGE DIAGNOSES:  1. Pyelonephritis/Klebsiella urinary tract infection with sepsis.      Obstructive distal right ureter with 5-mm calculus noted on CT.  2. Thrombocytopenia in setting of sepsis.  3. Hypokalemia.  4. Depression.  5. History of colon cancer.   HISTORY OF PRESENT ILLNESS:  [name] is a 77 year old female who was  admitted on Dec 07, 2007, with complaint of chills and foul-smelling  urine.  She has chronic nephrostomy tubes which are managed by Dr.  Aldean Ast.  She was admitted for further evaluation and treatment.   PAST MEDICAL HISTORY:  1. Colon cancer.  2. Depression.  3. Osteoporosis.  4. Anemia.  5. Low back pain.  6. Nephrolithiasis.  7. Retroperitoneal stenosis.  8. Ureteral stenosis.  9. Vitamin D deficiency.  10.Vitamin B12 deficiency.   COURSE OF HOSPITALIZATION:  Pyelonephritis/Klebsiella UTI with sepsis.  The patient was admitted.  She underwent urine culture and blood  culture.  Blood cultures grew Cipro sensitive Klebsiella pneumoniae.  She did run fevers for several days.  Her fevers have now improved.  T-  max at the time of discharge overnight was 99.7.  Her white count is 5.  She was seen in followup by Dr. Aldean Ast on Dec 10, 2007, who felt that  her tubes were draining appropriately and felt that she could go home  from the urological standpoint.  She was found to have some mild  thrombocytopenia with a platelet count of 16109.  Platelets at the time  of discharge were 78,000.  This will need to be followed up as an  outpatient.  Should recover as her infection improves.  At this  time, we  plan to treat the patient for 2 week total course of antibiotics.  We  will defer longer treatment with the patient's primary care based on  clinical response.  Of note, the patient underwent an ultrasound the  abdomen which showed mild right hydro which had improved since her CT  which was performed the day prior showing an obstructive distal right  ureter with 5-mm calculus.   MEDICATIONS AT THE TIME DISCHARGE:  1. Cipro 500 mg p.o. b.i.d. x1 for an additional day.  2. Oxycodone/acetaminophen 10/325 mg p.o. q.6 hours p.r.n.  3. Temazepam 30 mg at bedtime as needed.  4. Vitamin B12 1000 mcg tablet 2 tablets p.o. daily.  5. Oxytrol 3.9 mg patch, to change 3 days.  6. Vitamin D3 1000 units tablets 2 tablets p.o. daily.  7. Halcion 0.25 mg tablet 1 tablet p.o. nightly as needed.   PERTINENT LABORATORY DATA:  At the time of discharge, hemoglobin 10.6,  hematocrit 30.4, platelet 78, BUN 6, creatinine 0.9, potassium 3.4,  sodium 141.   DISPOSITION:  The patient will be discharged to home.   FOLLOWUP:  She is to follow up with Dr. Aldean Ast as scheduled and is  scheduled to followup with Dr. Posey Rea on Tuesday, Dec 18, 2007 at 3  o' clock p.m.  She is instructed to call Dr. Posey Rea should she  develop fever of 101 or weakness.  To go to the ER, if severe.      Sandford Craze, NP     MO/MEDQ  D:  12/11/2007  T:  12/11/2007  Job:  [id number]   cc:   Courtney Paris, M.D.  Georgina Quint. Plotnikov, MD

## 2010-12-21 NOTE — H&P (Signed)
[name], [name]               ACCOUNT NO.:  [id number]   MEDICAL RECORD NO.:  [id number]          PATIENT TYPE:  INP   LOCATION:  1532                         FACILITY:  [facility]   PHYSICIAN:  Alfonse Ras, MD   DATE OF BIRTH:  [birth year]   DATE OF ADMISSION:  02/19/2007  DATE OF DISCHARGE:                              HISTORY & PHYSICAL   ADMISSION DIAGNOSIS:  Partial small bowel obstruction.   HOPI:  Patient is a very complicated 77 year old white female well known  to Dr. Cyndia Bent.  Patient has undergone colectomy, colostomy,  ileostomy, resection of colostomy and replacement of colostomy as well  as total abdominal hysterectomy.  Patient has also been treated with  pelvic radiation for endometrial cancer in the past.  She developed  minimal output from her colostomy starting on Saturday and presented  here to the emergency room.  X-rays show small bowel air fluid levels.  She did start having some abdominal pain as of yesterday.  She was told  by our office to take some magnesium citrate, which she did.  She  vomited that and then presented to the emergency room where x-rays  showed a partial small bowel obstruction.  However, in the emergency  room she did start having some very small colostomy output.  I had a  long talk with the patient who really did not want to be admitted and  wanted to go home, but I thought with her x-rays and minimal ostomy  output she would be best served with an NG tube, possibly some  additional magnesium citrate down the NG tube and Fleet's Enema through  her colostomy.  Based on my reading of the operative reports, though Dr.  Jamey Ripa is out of town, she has pretty much a concrete abdomen and very  difficult secondary to adhesions.   PAST MEDICAL HISTORY:  Significant only as above.   PAST SURGICAL HISTORY:  Is significant as above.   MEDICATIONS:  Temazepam, iron and multivitamins.   PHYSICAL EXAM:  She is an age-appropriate white  female in virtually no  distress.  Her temperature is 98, her blood pressure is 106/62, her  heart rate is 75 and her respiratory rate is 20.  HEENT EXAM:  Benign.  Normocephalic, atraumatic.  Pupils are equal,  round, reactive to light.  LUNGS:  Clear to auscultation and percussion x2.  HEART:  Regular rate and rhythm without murmurs, rubs or gallops.  ABDOMEN:  Soft, minimally tender in the right lower quadrant, minimal  distention and a slight amount of ostomy output.  EXTREMITIES:  Show no clubbing, cyanosis or edema.   REVIEW OF SYSTEMS:  Positive for emesis and minimal colostomy output but  no other constitutional symptoms.   LABORATORY VALUES:  Show a white count of 8000, hemoglobin of 12.7,  hematocrit of 37.  Normal electrolytes including a sodium of 138,  potassium of 3.9, chloride of 97, glucose of 107.   IMPRESSION:  Partial small bowel obstruction, questionable constipation.   PLAN:  1. NG tube placement.  2. Will give some magnesium citrate down the tube.  3. Also give her a Fleet's Enema through her colostomy.  4. Repeat a KUB tomorrow and hopefully she will improve.      Alfonse Ras, MD  Electronically Signed     KRE/MEDQ  D:  02/19/2007  T:  02/20/2007  Job:  [id number]

## 2010-12-21 NOTE — Consult Note (Signed)
[name], [name]               ACCOUNT NO.:  [id number]   MEDICAL RECORD NO.:  [id number]           PATIENT TYPE:   LOCATION:                                 FACILITY:   PHYSICIAN:  Courtney Paris, M.D.DATE OF BIRTH:  [birth year]   DATE OF CONSULTATION:  12/10/2007  DATE OF DISCHARGE:                                 CONSULTATION   REQUESTING PHYSICIAN:  Valerie A. Felicity Coyer, MD.   REASON FOR CONSULTATION:  Pyelonephritis, urostomy, and bilateral  nephrostomy tubes.   BRIEF HISTORY:  This 77 year old lady from [place] came in acutely on  Dec 07, 2007 with acute right-sided flank pain.  She has had bilateral  nephrostomy tubes.  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  The tube to be changed every month or so.  The right  tubes stopped draining just before she had the pain and then later some  fever.  Dr. Jamey Ripa has operated on her numerous times.  She has  diverting left colostomy first performed in December 2007 and then had  to have a colostomy revision in December later that same year.  Her  hospitalization was complicated by ileus postop.  She had colon cancer  surgery by Dr. Jamey Ripa in 1989.  She subsequently had perforation during  a colonoscopy which resulted in losing more bowel.  She had a ileostomy  for a while but then this was taken down and had a draining cutaneous  bowel fistula on her left lower quadrant.  This subsequent healed with  some Fibrin glue.  I have been changing bilateral ureteral stents for  years because of scar tissue causing ureteral obstruction bilaterally.  But in October 2007, she was found to have a large vesicovaginal fistula  that had, bowel from her radiated bladder and the ends of the pigtail  catheter just eroding through.  She had a diverting bilateral  percutaneous tubes and hope that this would drive up her bladder but it  never did.  She still uses about eight serenity pads a day for when she  stands  up she leaks.  She has been getting along fairly well.  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  When she came in last week she had acute flank pain.  A CT scan showed what looked like a 5 mm pelvic stone in the ureter that  was obstructing, but the tubes were also changed out, the right was  obstructed.  When this was done they started draining well.  She did  have positive blood cultures with Klebsiella both in her blood and in  her urine.  She has been on antibiotics and her white count has come  down.  Her white count on admission was 11,600, yesterday it was 7,200;  hematocrit 38, and creatinine stable at 1.3.  She still is feeling  better.  No more pain or fever and has been afebrile over the last  several days.  She is on some Oxytrol patch in order to help  keep for  dryness instead of the generic oxybutynin that was making her mouth so  dry.   Past medical history, review of systems, family history, and social  history are reviewed.  She has a son that lives in [place] who helps  take care of her husband when she has issues.  She has a daughter here  and lives in [place].   PHYSICAL EXAMINATION:  VITAL SIGNS:  Temperature is 98.1, blood pressure  120/70, pulse is 88, and respirations 18.  GENERAL:  She is pleasant, thin, and white female in no acute distress.  HEENT:  Clear.  ABDOMEN:  Soft and benign.  She has a ileostomy in her left lower  quadrant functioning.  The fistula in her left upper quadrant has  sealed.  No CVA tenderness.  She has bilateral nephrostomy tubes  draining clear urine bilaterally.  PELVIC:  Deferred, but previously had been found to have a vesicovaginal  fistula.  EXTREMITIES:  Thin, nontender, and benign.   IMPRESSION:  1. Acute obstruction of right nephrostomy tube.  2. Bilateral nephrostomy tubes for vesicovaginal fistula.  3. 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   4. Apparent 5-mm stone in her right distal ureter which she never had      before actually from the nephrostomy tubes.   RECOMMENDATIONS:  She is to probably go home if she is afebrile on  antibiotics.  She had an appointment to have the tubes changed this week  at [facility] which I have seen will be necessary, but keep goes up  every month as she has been doing.  She has never had stones before and  even if she did have one I think that may have caused the acute pain it  should go ahead and passed down into the bladder and then of the vagina  with her fistula.  The tubes are draining well and she is not febrile at  this point.  White count has come down.  I think from the GU standpoint,  she could probably go home and then just follow up with [place] as needed.      Courtney Paris, M.D.  Electronically Signed     HMK/MEDQ  D:  12/10/2007  T:  12/11/2007  Job:  [id number]

## 2010-12-21 NOTE — Discharge Summary (Signed)
[name], [name]               ACCOUNT NO.:  [id number]   MEDICAL RECORD NO.:  [id number]          PATIENT TYPE:  INP   LOCATION:  6713                         FACILITY:  [facility]   PHYSICIAN:  Raenette Rover. Felicity Coyer, MDDATE OF BIRTH:  12/14/1927   DATE OF ADMISSION:  10/31/2006  DATE OF DISCHARGE:  11/02/2006                               DISCHARGE SUMMARY   DISCHARGE DIAGNOSES:  1. Partial small-bowel obstruction.  2. Rule out bronchogenic carcinoma.  The patient refused biopsy as      offered by Dr. Shelle Iron during this admission.  3. Malnutrition.  4. Hypotension, likely secondary to hypoalbuminemia.   HISTORY OF PRESENT ILLNESS:  [name] is a 77 year old female who was  admitted on October 31, 2006 with chief complaint of abdominal pain,  nausea and vomiting.  She noted that nausea and vomiting began 3 days  prior to this admission with increasing abdominal pain and distension;  she also noted that her colostomy had increased the amount of output.  She was admitted for further evaluation and treatment.   PAST MEDICAL HISTORY:  1. History of colon cancer and colovesical fistula.  2. Cervical cancer.  3. Status post nephrostomy tubes.  4. 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  5. Pelvic radiation fibrosis.  6. Emphysema.  7. History of iron deficiency anemia.  8. Cachexia.   COURSE OF HOSPITALIZATION:  PROBLEM #1 - PARTIAL INTERMITTENT RECURRENT  SMALL BOWEL OBSTRUCTION:  The patient was admitted and underwent the  KUB.  KUB noted bilateral nephrostomy tubes and mild small bowel  distention, which was nonspecific, and in addition, incidental note of  right upper lobe opacity was noted.  CT of abdomen and pelvis noted  intermittent small bowel dilatation with left lower quadrant ileostomy  which was suggestive of partial small bowel obstruction.  CT of the  chest noted right upper lobe consolidation suspicious for primary  bronchogenic carcinoma.  In addition,  mediastinal adenopathy was noted.   The patient was seen in consultation by Surgery.  The patient's symptoms  resolved and diet was advanced.   A pulmonary consult was obtained and the patient was seen by Dr. Marcelyn Bruins; he recommended a fiberoptic bronchoscopy with transbronchial  lung biopsies; however, at that time of his consult, the patient refused  any further workup, understanding that this was likely to be a malignant  process.   MEDICATIONS AT TIME OF DISCHARGE:  1. Percocet 5/325 mg one tablet p.o. q.4-6 h. as needed.  2. Temazepam 30 mg p.o. h.s. p.r.n.  3. Ditropan 5 mg p.o. b.i.d.  4. Iron 325 mg p.o. t.i.d.   DISPOSITION:  The patient was discharged to home.   FOLLOWUP:  The patient was instructed to follow up with Dr. Jamey Ripa as  needed.   LABORATORY DATA AT DISCHARGE:  TSH 1.39.  Hemoglobin 11.8, hematocrit  35.1, white blood cell count 6.3, platelets 284,000.  BUN 27, creatinine  1.1.      Sandra Craze, NP      Raenette Rover. Felicity Coyer, MD  Electronically Signed    MO/MEDQ  D:  02/06/2007  T:  02/07/2007  Job:  [id number]   cc:   Georgina Quint. Plotnikov, MD

## 2010-12-24 NOTE — Consult Note (Signed)
[name], [name]               ACCOUNT NO.:  [id number]   MEDICAL RECORD NO.:  [id number]          PATIENT TYPE:  INP   LOCATION:  4713                         FACILITY:  [facility]   PHYSICIAN:  Courtney Paris, M.D.DATE OF BIRTH:  [birth year]   DATE OF CONSULTATION:  08/07/2006  DATE OF DISCHARGE:                                 CONSULTATION   REQUESTED BY:  Dr. Cicero Duck.   REASON FOR CONSULTATION:  Persistent vesicocolonic fistula.   BRIEF HISTORY:  This 77 year old lady with a very complicated history  was admitted August 05, 2006, with failure to thrive, abdominal pain,  and profound weakness.  She was in the hospital from July 12, 2006,  to July 30, 2006, with a diverting left colostomy first performed on  July 12, 2006, and then had to have a colostomy revision on July 17, 2006.  Her hospitalization was complicated by ileus postop, UTI,  hypokalemia, and anemia.  She started on TPN on July 15, 2006.  Nephrostomy tubes were changed on July 20, 2006.  She was noted to  have a small peristomal abscess at that time.  She had endometrial  cancer treated with radiation and hysterectomy in 1977.  She had colon  cancer surgery by Dr. Jamey Ripa in 1989.  She subsequently had perforation  during a colonoscopy, which resulted in losing more bowel.  She had a  ileostomy for a while, then this was taken down, and a draining  cutaneous bowel fistula in her left lower quadrant that subsequently  healed with some fibrin glue.  I have been changing bilateral ureteral  stents for several years until this fall I noticed that when I changed  them the last time, there was a small fistula into the rectum, which  seemed to get worse.  So, she had the diverting colotomy and bilateral  percutaneous tubes in the hopes that this would dry up her bladder and  allow this fistula to eventually heal.  She is not a surgical candidate  for this to be done any other way; however,  she has been having some  leaking that she cannot control.  When a catheter was put in, she would  have a fair amount of urine that apparently got down the ureters despite  the functioning nephrostomies.  A cath was put in today after about 5  attempts.  She did have a fair amount of several 100 mL of fluid in her  bladder.  She has been feeling better.  She finally got an ostomy bag to  go over her ostomy, which is really almost an ileostomy, which has been  irritated as well.  She is now to get some TPN and get some nutrition,  as she has lost quite a bit of weight and is quite catabolic.  White  count is 80,100; hematocrit 33%; creatinine 1.2 and stable.  I do not  think that she needs antibiotics.  At this point, her cultures have been  negative, but I would leave her Foley catheter for a while and we will  just see if she can void  on her own.  I hate to leave the catheter any  longer, making the fistula bigger than it is, but we will leave the  catheter to straight drainage for now.   Her past medical history, social history, and 12-point review of systems  were all reviewed and noted on the history of August 05, 2006, with no  other changes.   PHYSICAL EXAMINATION:  VITAL SIGNS:  Blood pressure is 100/70,  temperature 98, and her pulse is around 98 or so.  GENERAL:  She is a very thin, cachectic appearing, white female in no  acute distress.  HEENT:  Clear.  ABDOMEN:  Soft.  She has an ileostomy bag in her left lower quadrant  that seems to be functioning well.  Her abdomen is soft and benign.  Foley catheter is in place.  EXTREMITIES:  Negative.  No edema.  Good distal pulses.   IMPRESSION:  1. Vesicocolonic fistula.  2. Functioning nephrostomies to try to divert urine away from this.   RECOMMENDATIONS:  Recommend to leave the Foley catheter for now and just  see if we can get her to dry up a little bit.      Courtney Paris, M.D.  Electronically Signed      HMK/MEDQ  D:  08/07/2006  T:  08/08/2006  Job:  [id number]

## 2010-12-24 NOTE — Op Note (Signed)
NAME:  [name], [name]                         ACCOUNT NO.:  [id number]   MEDICAL RECORD NO.:  [id number]                   PATIENT TYPE:  AMB   LOCATION:  ENDO                                 FACILITY:  [facility]   PHYSICIAN:  Lina Sar, M.D. [facility]               DATE OF BIRTH:  1928/03/24   DATE OF PROCEDURE:  DATE OF DISCHARGE:                                 OPERATIVE REPORT   PROCEDURES:  Flexible sigmoidoscopy.   INDICATIONS:  This 77 year old white female is being evaluated for takedown  of ileostomy which was created about six months ago after cecal perforation  for benign disease.  Currie Paris, M.D., is now considering takedown  of the ileostomy and reanastomosis.  Her left colon has a mucous fistula  which has been recently draining some odorous drainage.  She took a Fleets  enema this morning with recovery of some small amount of blood and fecal  material.  She has not been having any stools through the rectum.  She is  done under sedation.   ENDOSCOPE:  Olympus single-channel video endoscope.   SEDATION:  Versed 7.5 mg IV and Demerol 50 mg IV.   FINDINGS:  The Olympus single-channel video endoscope was passed under  direct vision through the rectum to sigmoid colon.  The patient was  monitored by pulse oximetry.  Oxygen saturations were normal.  The anal  canal was normal.  The rectal ampulla up to 15 cm showed essentially normal-  appearing mucosa that was slightly hyperemic and bled easily on contact with  the scope, but essentially it appeared normal.  At the level of 50 cm from  the rectum was an old sigmoid anastomosis.  It had build up of granulation  tissue, especially one area which formed a pseudopolypoid configuration  around a retained suture.  Two sutures were seen in the anastomosis, which  was widely patent.  The endoscope passed through the anastomosis without  difficulty into the sigmoid colon where at the level of 30 cm from the  rectum mucosa  became coated with thick, white mucous.  Initially it was not  clear whether this was purulent material or just sediment.  It was partially  washed off and the mucosa underneath it appeared normal.  There was again  slight hyperemia to the mucosa, but there were no focal lesions and no  ulcerations.  The bowel did not appear to be ischemia.  I was able to  advance the scope all the way to 40 cm from the rectum, at which point the  endoscope came out of the mucous fistula out through the stoma.  Video  photographs of the stoma were obtained.  The stoma appeared widely patent.  Mucosa was hyperemic, but there were no ulcerations.  The mucosa was coated  with whitish exudate, partly mucous and partly stool.  Multiple biopsies  were then taken in three specimen containers.  One from 30-40 cm from the  mucous-coated mucosa.  The second bottle was from 0-30 cm.  The third bottle  was from the anastomosis at 15 cm from the granulation-appearing mucosa to  rule out recurrent CA, although grossly the mucosa did not appear to be  malignant.  The patient tolerated the procedure well.   IMPRESSION:  1. Viable left colon from 0-40 cm to mucous fistula.  2. Old sigmoid anastomosis at 15 cm with retained sutures and mild     deformity.  3. Diffusely hyperemic colonic mucosa, rule out radiation colitis, rule out     nonspecific colitis due to colon excretion.    PLAN:  1. Await results of the biopsies.  2. In my opinion, the patient's colon appears viable.  There is no evidence     of ongoing active process.  She would be a suitable candidate for     reanastomosis.                                               Lina Sar, M.D. [facility]    DB/MEDQ  D:  11/20/2002  T:  11/20/2002  Job:  [id number]   cc:   Currie Paris, M.D.  [address]  [place]  [place] [address]  Fax: [phone]

## 2010-12-24 NOTE — Discharge Summary (Signed)
[name], [name]               ACCOUNT NO.:  [id number]   MEDICAL RECORD NO.:  [id number]          PATIENT TYPE:  INP   LOCATION:  1532                         FACILITY:  [facility]   PHYSICIAN:  Alfonse Ras, MD   DATE OF BIRTH:  07/21/1928   DATE OF ADMISSION:  02/19/2007  DATE OF DISCHARGE:  02/21/2007                               DISCHARGE SUMMARY   ADMISSION DIAGNOSIS:  Partial small-bowel obstruction.   DISCHARGE DIAGNOSIS:  Partial small-bowel obstruction.   CONDITION ON DISCHARGE:  Stable, but with minimal improvement; the  patient is discharged against medical advice.   PLAN:  Follow up with Dr. Jamey Ripa as needed.   HOSPITAL COURSE:  The patient is a 77 year old white female with a long  complex abdominal history of colectomy, colostomy, ileostomy, resection  of colostomy, replacement of colostomy and total abdominal hysterectomy.  She came in with nausea and vomiting, had an NG tube placed in the  emergency room and was kept in the hospital.  The patient was very  anxious to not stay in the hospital, as she needed to take care of her  husband.  I spoke with her at length and even though she was continuing  to have minimal ostomy output even with a Fleet enema, the patient  insisted on being discharged home.  She was discharged home on hospital  day #2 against medical advice, but with complete understanding that if  she had any recurrence, to please return to the hospital.   DISPOSITION:  Discharged to home.      Alfonse Ras, MD  Electronically Signed     KRE/MEDQ  D:  03/08/2007  T:  03/09/2007  Job:  [id number]   cc:   Currie Paris, M.D.  [address]  [place]  [place] [address]

## 2010-12-24 NOTE — Op Note (Signed)
NAME:  [name], [name]                         ACCOUNT NO.:  [id number]   MEDICAL RECORD NO.:  [id number]                   PATIENT TYPE:  AMB   LOCATION:  NESC                                 FACILITY:  [facility]   PHYSICIAN:  Rozanna Boer., M.D.      DATE OF BIRTH:  01/23/1928   DATE OF PROCEDURE:  04/14/2004  DATE OF DISCHARGE:                                 OPERATIVE REPORT   PREOPERATIVE DIAGNOSIS:  Bilateral hydronephrosis.   POSTOPERATIVE DIAGNOSES:  1.  Bilateral hydronephrosis.  2.  Right ureteral stones on stent.   PROCEDURES:  Cystoscopy and exchange, bilateral ureteral stents, with right  ureteroscopy and stone removal.   ANESTHESIA:  General.   SURGEON:  Courtney Paris, M.D.   BRIEF HISTORY:  This 77 year old patient has bilateral ureteral stents and  comes in for changes.  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  She  has had bilateral stents since October 2004.   The patient was placed on the operating table in the dorsal lithotomy  position, after satisfactory induction of general anesthesia was prepped and  draped with Betadine in the usual sterile fashion.  The cystoscope was  inserted and the stent from the right side was grasped and pulled out to the  urethral meatus.  I tried to pass a guidewire through this, but it would go  up about a third of the way, then it would perforate through one of the  holes.  I never could completely pull this out.  I pulled it down about  halfway.  Then I ended up pulling the stent out without the guidewire, and  there were several stones that came off the stent in the distal right  ureter.  Using an open-ended catheter with a Glidewire, I was able to get  through the Steinstrasse and pass this up to the region of the proximal  right ureter.  It seemed to coil  up at this point and I remembered there was  a kink in the ureter at this point that made it difficult, so I injected  some dye to show this and then with the Glidewire was able to negotiate the  kink and get it up into the renal pelvis.  I then slid the open-ended  catheter over this, up into the renal pelvis, and removed the Glidewire.  There was a hydronephrotic drip obtained.  I then passed a guidewire through  this open-ended catheter and left this in place as a safety wire.  Then  using the 6 short ureteroscope, I was able to get this in the distal right  ureter, get past the stones, and then with several retrievals was able to  remove 90% of the stone fragments.  All that was left were some little tiny  pieces.  There  were some fairly large chunks that had encrusted on the  stent.  When this was clear, I then over a cystoscope passed a new 7 French  x 24 cm length double J ureteral stent into the right renal pelvis, removed  the guidewire with the coil in the proximal renal pelvis and in the bladder  in a good position.  The left stent was then grasped and pulled outside the  urethral meatus.  I was able to get a guidewire through this up into the  region of the left renal pelvis and then remove the old stent. Over the  guidewire I was able to pass a new 7 French x 24 cm length double J ureteral  stent, which had a nice coil in the bladder and then in the left renal  pelvis.  The bladder was drained, scope removed, the patient given a B&O  suppository, and taken to the recovery room in  good condition.  She will be covered with antibiotics and because of the  encrustations of the stent in three months, will change this in two months  and put her on some Urised K in the meantime to help prevent this from  happening.  The patient tolerated the procedure well and will be sent home  with detailed written instructions.                                               Rozanna Boer., M.D.    HMK/MEDQ  D:  04/14/2004  T:  04/14/2004  Job:  [id number]

## 2010-12-24 NOTE — H&P (Signed)
NAME:  [name], [name]                         ACCOUNT NO.:  [id number]   MEDICAL RECORD NO.:  [id number]                   PATIENT TYPE:  INP   LOCATION:  0378                                 FACILITY:  [facility]   PHYSICIAN:  Georgina Quint. Plotnikov, M.D. [facility]      DATE OF BIRTH:  1927-10-28   DATE OF ADMISSION:  04/08/2003  DATE OF DISCHARGE:                                HISTORY & PHYSICAL   CHIEF COMPLAINT:  Extreme weakness, abdominal pain, lightheadedness.   HISTORY:  The patient is a 77 year old female with three days of the above  symptoms getting progressively worse, complaining of back muscle pain, no  appetite, has unsteady gait, abdominal pain, some chills.  Has been having  chronic diarrhea.   PAST MEDICAL HISTORY:  1. Colitis.  2. Hysterectomy for endometrial cancer.  3. Status post malignant colon polyp removed, requiring colectomy.  4. Postoperative diarrhea.   FAMILY HISTORY:  Mother died with intestinal bleeding at age 60.  Father  died at age 35 with MI.   SOCIAL HISTORY:  She is married.  She is an ex-smoker.  Does not drink  alcohol.   REVIEW OF SYSTEMS:  As above.  She has been having recurring diarrhea  postoperatively, chronic weight loss, poor appetite, nausea.  The rest is as  above or negative.  Denies blood in the stool or black tarry looking stool.   CURRENT MEDICATIONS:  1. Lomotil two b.i.d.  2. Iron.  3. Calcium.  4. Vitamin D.  5. Multivitamin.  6. Restoril.   ALLERGIES:  No known drug allergies.   PHYSICAL EXAMINATION:  VITAL SIGNS:  O2 saturation 93% on room air, blood  pressure 90/50, pulse 104, temperature 100, weight 112 pounds.  GENERAL:  She appears very tired, in mild acute distress.  Very pale.  HEENT:  Dry mucosa.  NECK:  Supple, no meningeal signs.  LUNGS:  Clear; no wheeze or rales.  HEART:  S1 and S2, tachycardic.  ABDOMEN:  Soft, tender in the lower half, more in the left lower quadrant.  Abdominal wall adjacent to the  postoperative scar is infiltrated, firm, red,  and tender.  No rebound symptoms.  RECTAL:  Deferred.  EXTREMITIES:  Lower extremities without edema.  NEUROLOGICAL:  She is alert, oriented, and cooperative.   ASSESSMENT/PLAN:  1. Left lower quadrant abdominal pain, possible cellulitis versus     intraabdominal access.  Started intravenous Unasyn.  Surgical     consultation with Dr. Orson Slick.  Admit to Telemetry.  CT scan of the     abdomen and pelvis stat.  Obtain blood work.  2. Extreme fatigue, likely due to the above and possible anemia.  We will     check CBC, type and cross for two units just in case.  3. Hypotension.  We will give intravenous fluids.  4. Fever.  Plan as above.  Obtain blood culture, check urinalysis.  Georgina Quint. Plotnikov, M.D. [facility]    AVP/MEDQ  D:  04/08/2003  T:  04/08/2003  Job:  [id number]   cc:   Currie Paris, M.D.  [address]  [place]  [place] [address]  Fax: [phone]   Lina Sar, M.D. [facility]

## 2010-12-24 NOTE — Op Note (Signed)
NAME:  [name], [name]                         ACCOUNT NO.:  [id number]   MEDICAL RECORD NO.:  [id number]                   PATIENT TYPE:  AMB   LOCATION:  NESC                                 FACILITY:  [facility]   PHYSICIAN:  Rozanna Boer., M.D.      DATE OF BIRTH:  03-22-1928   DATE OF PROCEDURE:  01/07/2004  DATE OF DISCHARGE:                                 OPERATIVE REPORT   PREOPERATIVE DIAGNOSIS:  Bilateral hydronephrosis.   POSTOPERATIVE DIAGNOSIS:  Bilateral hydronephrosis.   OPERATION:  Cystoscopy and exchange of bilateral ureteral stents.   ANESTHESIA:  General.   SURGEON:  Courtney Paris, M.D.   BRIEF HISTORY:  This 77 year old white female has had endometrial cancer  with a hysterectomy and x-ray therapy in the 1970s.  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  She was found  to have bilateral hydronephrosis, and stents were placed for the first time  on May 21, 2003.  She had right ureteral obstruction at the UP junction  and also at the pelvic brim and on the left side of the pelvic brim near the  phlegmon.  Stents were last changed in mid January, 2005.  She is admitted  now for change of the stents at this time, which she has tolerated.   The patient was placed on the operating room table in the dorsal lithotomy  position after satisfactory induction of general anesthesia.  Was prepped  and draped with Betadine and given IV Ancef and gentamicin.  The pan  endoscope was inserted with the camera, and the stents were visualized.  No  other bladder lesions were seen.  A few little stone fragments were noted  floating in the bladder.  The left stent was grasped and pulled outside the  urethral meatus.  Under fluoroscopy, I was able to pass a guidewire up to  the left renal pelvis and then removed the stent.  A guidewire was then  back-  loaded over the cystoscope, and a new [place] 7 x 24 cm ureteral stent was  then fashioned under fluoroscopy with the coil in the renal pelvis and one  in the bladder in good position.  Next, using the camera, the right stent  was seen, grasped, and pulled outside of the urethral meatus, taking care  not to disturb the left stent.  A guidewire was then passed under  fluoroscopy up the right ureter into the stent up to the level of the  kidney.  The catheter was then removed.  The stent was then back-loaded over  the cystoscope, and a new [place] 7 x 24 cm length double-J ureteral stent  was then passed, again under fluoroscopy, with a good coil in the renal  pelvis and one in the bladder.  The grasping forceps were then used to  readjust the right slightly so  that it was corresponding to the level where  the coil was in the renal pelvis seen on the preoperative films.  The  patient tolerated this well and returned to the recovery room in good  condition.  Will later be discharged as an outpatient.  Will cover her cysto  with three days of Cipro and make plans to change this again in probably 3-4  months.                                               Rozanna Boer., M.D.    HMK/MEDQ  D:  01/07/2004  T:  01/07/2004  Job:  [id number]

## 2010-12-24 NOTE — Op Note (Signed)
NAMEMAAME, DACK               ACCOUNT NO.:  [id number]   MEDICAL RECORD NO.:  [id number]          PATIENT TYPE:  AMB   LOCATION:  NESC                         FACILITY:  [facility]   PHYSICIAN:  Courtney Paris, M.D.DATE OF BIRTH:  July 18, 1928   DATE OF PROCEDURE:  06/01/2005  DATE OF DISCHARGE:                                 OPERATIVE REPORT   PREOPERATIVE DIAGNOSIS:  Bilateral ureteral obstruction (not stone).   POSTOPERATIVE DIAGNOSIS:  Bilateral ureteral obstruction (not stone).   OPERATION:  Cysto, exchange bilateral ureteral stents, right retrograde  pyelogram.   ANESTHESIA:  General.   SURGEON:  Courtney Paris, M.D.   BRIEF HISTORY:  This 77 year old white female has bilateral ureteral  obstruction secondary to numerous intra-abdominal procedures. She had  endometrial cancer and was status post a hysterectomy and radiation in the  1970s and then in 1989 had colon cancer with hemicolectomy but had a cecal  perforation after colonoscopy and has had multiple abdominal surgeries since  then to try to repair this. She now has an enterocutaneous fistula in her  left lower quadrant but Dr. Jamey Ripa has actually put in a glue which has  actually dried up the drainage and for the last week or so has not had a  bag. She has had bilateral stents since October 2004 when she was found to  have bilateral hydronephrosis. She had a lot of trouble with stones forming  on the stents and has been on Polycitra packets and she is up to two a day  plus increased fluids which seems to have helped. She enters now to have  these stents changes since it has been 3 months since the last change.   The patient was placed on the operating table in dorsal lithotomy position  and after satisfactory induction of general endotracheal anesthesia was  prepped and draped in the usual sterile fashion, given IV antibiotics,  panendoscope was inserted. The stent coming from the right orifice was  grasped and then pulled outside the urethral meatus. The end in the bladder  was somewhat encrusted with stones but the lumen was clear as I could pass a  guidewire up through the stent up to the level of the kidney, I then removed  the stent. Over the guidewire, a cystoscope was back loaded and a new 7-  French x 24 cm length double-J ureteral stent was passed over the guidewire.  When this was done in pulling the guidewire back, it was pulled back  inadvertently and the stent pulled out of the renal pelvis and could not be  reinserted. I then removed the stent, passed an open-ended six ureteral  catheter over the guidewire,  removed the guidewire and then performed a  retrograde.   The retrograde was done demonstrating a kink in the ureter right below the  renal pelvic junction with a dilated renal pelvis. The proximal ureter was  also slightly dilated where the previous stent seemed to have coiled up when  I tried to insert it. I then used a Glidewire to negotiate the curve at the  UP junction into  the renal pelvis under fluoroscopy.   The open-ended catheter was then passed into the renal pelvis, the Glidewire  was then removed and a guidewire inserted. Over the guidewire, I back loaded  the cystoscope and was able to at this time to advance the stent up into the  renal pelvis. When I removed the guidewire, there was a nice coil in the  renal pelvis with one in the bladder. Next the left ureteral stent was  grasped and pulled outside the urethral meatus. Again there was some stones  on the bladder end but much better than it had been in past exchanges. The  guidewire was then passed under fluoroscopy up to the level of the kidney.  As the stent was then removed, the guidewire was then back loaded over the  cystoscope and a new 7-French x 24 cm length double-J ureteral stent was  passed, this time in good position with the coil in the renal pelvis and one  in the bladder. These were  adjusted slightly with the grasping forceps,  bladder drained. The patient given a B&O suppository, some IV Toradol and  taken to the recovery room in good condition. She will be later discharged  as an outpatient. Will plan to change these in about 3 months but I will see  her in 2 months and maybe do a renal CT to see if she still has the  obstruction which would necessitate the stents before it is changed next  time.      Courtney Paris, M.D.  Electronically Signed     HMK/MEDQ  D:  06/01/2005  T:  06/01/2005  Job:  [id number]

## 2010-12-24 NOTE — Op Note (Signed)
NAMEARBADELLA, Sandra               ACCOUNT NO.:  [id number]   MEDICAL RECORD NO.:  [id number]          PATIENT TYPE:  AMB   LOCATION:  NESC                         FACILITY:  [facility]   PHYSICIAN:  Courtney Paris, M.D.DATE OF BIRTH:  20-Jul-1928   DATE OF PROCEDURE:  08/24/2005  DATE OF DISCHARGE:                                 OPERATIVE REPORT   PREOPERATIVE DIAGNOSIS:  Bilateral ureteral obstruction.   POSTOP DIAGNOSIS:  Bilateral ureteral obstruction. Bilateral stent  obstruction.   ANESTHESIA:  General.   PROCEDURE:  Change bilateral ureteral stents (complicated) with a right  retrograde pyelogram.   SURGEON:  Courtney Paris, M.D.   BRIEF HISTORY:  This 77 year old white female was admitted with bilateral  ureteral obstruction. She had endometrial cancer, post radiation and  hysterectomy in 1970s and in 1989 developed colon cancer. 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  This is slowed down remarkably since being injected with a glue back in  October.  Bilateral hydronephrosis was found 10/04. Stents have been changed  every 3 months but she has had a lot of stones encrustation on the stents  each time.  It has now been nearly 3 months since her last stent change. She  enters now for this at this time.   The patient was placed at the table in dorsal lithotomy position and after  satisfactory induction of general anesthesia, was prepped and draped with  Betadine and given IV antibiotics. The panendoscope was inserted. The  bladder was inspected. It was coarsely trabeculated had some edema around  both orifices and both stents seemed to be encrusted with stones. The right  loop was grasped and pulled outside the urethral meatus where under  fluoroscopy I tried to pass a guidewire up the stent but it was completely  obstructed.  I had to remove  the stent. I then used a guidewire to reaccess  the right ureter. This was a little difficult to find but was able to  finally get this up the orifice and got it up to the UPJ where it would not  go any further. I then passed an open-ended 6 ureteral catheter up to this  area and removed the guidewire.   A right retrograde pyelogram was then done demonstrating some hydronephrosis  and filling defects in the ureter consistent with some stone fragments and a  very tortuous UPJ. Using the fluoroscopy and a Glide wire, was able to  finally negotiate the UPJ and then advance the open-ended catheter up to the  level of the kidney renal pelvis.   The guidewire was then removed and a new guidewire was then passed up the  open-ended catheter in the open-ended catheter was then removed. Over the  guidewire in the renal pelvis a new 7-French x 24 cm length double-J  ureteral stent was passed with a coil in the renal pelvis and one in the  bladder in good position.   The stent coming out of the left ureteral  orifice was grasped and pulled  outside the urethral meatus but again, it was obstructed with stone. I could  not pass a guidewire through it.  I had to then remove the stent and then  was able to pass a guidewire up the left ureter up to level of the renal  pelvis.  Over the guide wire a new 7-French x 24 cm length double-J ureteral  stent was passed and with the coil in the renal pelvis, and one back in the  bladder and in good position. Bladder was then drained.  The BNO suppository  inserted and she was given a shot of Toradol. She  was taken to recovery room in good condition to be later discharged as an  outpatient.  We will be a little bit more insistent on her taking Urocit-K  and/or lemonade to try to help prevent encrustations of stones and will  change this in less than 3 months.      Courtney Paris, M.D.  Electronically Signed     HMK/MEDQ  D:  08/24/2005  T:   08/24/2005  Job:  [id number]

## 2010-12-24 NOTE — Consult Note (Signed)
   NAME:  [name], [name]                         ACCOUNT NO.:  [id number]   MEDICAL RECORD NO.:  [id number]                   PATIENT TYPE:  INP   LOCATION:  0378                                 FACILITY:  [facility]   PHYSICIAN:  Lorre Munroe., M.D.            DATE OF BIRTH:  22-Sep-1927   DATE OF CONSULTATION:  04/08/2003  DATE OF DISCHARGE:                                   CONSULTATION   CHIEF COMPLAINT:  Weakness.   PRESENT ILLNESS:  This is a 77 year old white female who is now three months  status post ileostomy closure and seven months status post emergency  colectomy for perforation with ileostomy and mucous fistula.  The patient  has a history of colon cancer and underwent sigmoid colectomy in 1989.  The  patient also had undergone an emergency hemicolectomy and repair of  perforation in December of 2003.  See previous records for more details of  the rather complex surgical history.   MEDICATIONS:  1. Zoloft.  2. Temazepam.  3. Hydrocodone.   ALLERGIES:  No known drug allergies.   PAST MEDICAL HISTORY:  1. She has radiation enteritis.  This was given for endometrial cancer.  2. She has osteoporosis.  3. Depression.  4. History of colon polyps.   PHYSICAL EXAMINATION:  GENERAL:  The patient is alert.  She is comfortable  at this time and her mental status is normal.  HEENT:  The head and neck are normal.  CHEST:  Clear to auscultation.  HEART:  Rate and rhythm are normal.  No murmur or gallop is noted.  ABDOMEN:  Quiet.  It is tender below the umbilicus and there is quite a bit  of redness around the lower midline wound which is entirely healed.  No  crepitation, fluctuance, skin necrosis, or vesicles are noted.  The remainder of her exam is unremarkable.   IMPRESSION:  Probable late wound infection with abscess, rule out abdominal  abscess.    PLAN:  The patient is on adequate antibiotic coverage and is receiving  appropriate supportive care.  I will check  her CT when it is done, and we  will arrange any surgical drainage which is necessary.  We will follow with  you.                                               Lorre Munroe., M.D.    WB/MEDQ  D:  04/08/2003  T:  04/08/2003  Job:  [id number]   cc:   Georgina Quint. Plotnikov, M.D. [facility] [address]

## 2010-12-24 NOTE — Op Note (Signed)
NAME:  [name], [name]                         ACCOUNT NO.:  [id number]   MEDICAL RECORD NO.:  [id number]                   PATIENT TYPE:  AMB   LOCATION:  NESC                                 FACILITY:  [facility]   PHYSICIAN:  Rozanna Boer., M.D.      DATE OF BIRTH:  04-13-1928   DATE OF PROCEDURE:  08/20/2003  DATE OF DISCHARGE:                                 OPERATIVE REPORT   PREOPERATIVE DIAGNOSES:  1. Bilateral hydronephrosis.  2. Enterocutaneous phlegmon.   POSTOPERATIVE DIAGNOSES:  1. Bilateral hydronephrosis.  2. Enterocutaneous phlegmon.   OPERATIONS:  1. Cystoscopy.  2. Change bilateral ureteral stents.   ANESTHESIA:  General.   SURGEON:  Courtney Paris, M.D.   BRIEF HISTORY:  This 77 year old white female had endometrial cancer, status  post hysterectomy and radiotherapy in the 1970's.  She had colon cancer  surgery in 1989, but had cecal perforation after the colonoscopy; also  multiple abdominal surgeries.  She now has an enterocutaneous fistula with  phlegmon and bilateral hydronephrosis found April 12, 2003.  She had  bilateral stents placed October 2004 and enters now to have these stents  changed.   DESCRIPTION OF PROCEDURE:  The patient was placed on the operating room  table in the dorsal lithotomy position, after satisfactory induction of  general anesthesia.  She was prepped and draped with Betadine and given IV  antibiotics.  A 21 panendoscope was inserted, and the bladder inspected.  There was a lot of edema around each of the ureteral orifices with the  stents coming out from each side.  No other bladder mucosal lesions seen.   The right stent was grasped and pulled out through the urethral meatus.  I  tried to pass the guide wire through this, but was unable to do so.  Therefore I re-instrumented her with a 21 panendoscope and passed a guide  wire alongside the stent; but, I could not get this up into the kidney.  An  open-ended  catheter was then passed over the guide wire and a retrograde  demonstrated two areas of obstruction; one at the UP junction with a curve  just distal to this, and then another one just above the pelvic rim.  I had  to use a Glide wire to get through the proximal ureteral stricture up into  the kidney, and then over the Glide wire passed a 6-French x 24 cm double-J  ureteral stent.  This took a good bit of time, but was finally located in  the renal pelvis with the coil in the kidney and then the bladder in a  satisfactory manner.   Similarly on the left side, I pulled out the stent beyond the urethral  opening.  But, I could not pass the guide wire through this.  I then had to  pass one along the side.  Again, I was not sure I was up in the kidney, so I  passed an  open-ended catheter over the guide wire.  I performed a retrograde  and showed that she had a somewhat dilated lower pole segment in the kidney.  I then passed the guide wire back through the open-ended catheter, and over  the guide wire passed a 6-French x 24 cm stent.  This coiled in the renal  pelvis in a satisfactory manner.   The bladder was drained.  A B&O suppository was inserted and the patient  taken to the recovery room in good condition.  She will be covered with  antibiotics and then brought back in three months to have these changed  again.                                               Rozanna Boer., M.D.    HMK/MEDQ  D:  08/20/2003  T:  08/20/2003  Job:  [id number]

## 2010-12-24 NOTE — H&P (Signed)
NAME:  [name], [name]                         ACCOUNT NO.:  [id number]   MEDICAL RECORD NO.:  [id number]                   PATIENT TYPE:  EMS   LOCATION:  ED                                   [facility]ACILITY:  [facility]   PHYSICIAN:  Corwin Levins, M.D. [facility]             DATE OF BIRTH:  July 24, 1928   DATE OF ADMISSION:  05/08/2003  DATE OF DISCHARGE:                                HISTORY & PHYSICAL   CHIEF COMPLAINT:  Left lower quadrant abdominal pain with apparent reopening  of the fistula.   HISTORY OF PRESENT ILLNESS:  [name] is a 77 year old white female with a  recent left lower quadrant abdominal abscess complicated by a fistula with  repair, now here after what sounds like straining at stool against a  questionable impaction for the last three days.  Last night, there was low-  grade temperature and apparent opening of the previous colocutaneous fistula  with obvious stool extruding.  There was some decrease in pain associated  with this but still painful and requiring now admission.   PAST MEDICAL HISTORY:   ILLNESSES:  1. History of endometrial cancer, status post TAH.  2. History of radiation enteritis.  3. Osteoporosis.  4. Depression.  5. History of colon polyps.   ALLERGIES:  1. NEURONTIN.  2. EFFEXOR.  3. EVISTA.   CURRENT MEDICATIONS:  1. Multivitamins.  2. Calcium.  3. Restoril 25 mg q.h.s.  4. Aspirin 81 mg daily.  5. Miacalcin daily.  6. Lomotil 2.5 mg q.i.d.  7. Cholestyramine 4 mg daily.  8. B12 injections.  9. She has taken Zoloft and hydrocodone in the past as well.   SOCIAL HISTORY:  No tobacco, occasional alcohol.   FAMILY HISTORY:  Mother deceased with some sort of colon bleeding.   REVIEW OF SYSTEMS:  Otherwise noncontributory.   PHYSICAL EXAMINATION:  GENERAL:  [name] is a 77 year old white female in  obvious left lower quadrant discomfort.  VITAL SIGNS:  Blood pressure 110/60, respirations 20, pulse 96, temperature  97.3, weight 108.  She  is a bit thin for height.  ENT:  Sclerae clear, TM's clear, pharynx benign.  NECK:  Without lymphadenopathy, JVD, thyromegaly.  CHEST:  No rales or wheezes.  CARDIAC:  Regular rate and rhythm, no murmur.  ABDOMEN:  Soft, nontender, positive bowel sounds, no organomegaly, no  masses, except for tenderness around the left lower quadrant.  She is placed  a colostomy bag over the obvious reopened fistula tract site in the left  lower quadrant.  There is marked amount of stool as well.  RECTAL EXAM:  Deferred.   LABORATORY DATA:  None done prior to visit.   ASSESSMENT AND PLAN:  1. Left lower quadrant pain with reopened fistula with questionable     impaction and low-grade temperature.  She is to be admitted, and we will     check routine labs, give IV  fluids, antiemetics if necessary, pain     control, IV Rocephin, and general surgery consult.  2. Other medical problems.  Continue all other home medications as is except     due to the above, she will need to stop the Lomotil and Cholestyramine.                                               Corwin Levins, M.D. [facility]    JWJ/MEDQ  D:  05/08/2003  T:  05/08/2003  Job:  [id number]   cc:   Currie Paris, M.D.  [address]  [place]  [place] [address]  Fax: [phone]   Georgina Quint. Plotnikov, M.D. [facility]

## 2010-12-24 NOTE — Op Note (Signed)
NAME:  [name], [name]                         ACCOUNT NO.:  [id number]   MEDICAL RECORD NO.:  [id number]                   PATIENT TYPE:  AMB   LOCATION:  NESC                                 FACILITY:  [facility]   PHYSICIAN:  Rozanna Boer., M.D.      DATE OF BIRTH:  October 25, 1927   DATE OF PROCEDURE:  11/12/2003  DATE OF DISCHARGE:                                 OPERATIVE REPORT   PREOPERATIVE DIAGNOSES:  Bilateral ureteral obstruction with hydronephrosis.   POSTOPERATIVE DIAGNOSES:  Bilateral ureteral obstruction with  hydronephrosis, retained right ureteral calculi.   ANESTHESIA:  General.   PROCEDURE:  Cystoscopy, remove bilateral stents, reinsert bilateral stents  and right ureteroscopy with holmium lasertripsy.   SURGEON:  Courtney Paris, M.D.   BRIEF HISTORY:  This 77 year old white female has a history of endometrial  cancer status post hysterectomy and radiotherapy in the 1970's.  In 1989,  she had colon cancer with surgery, subsequent perforation of the colon with  multiple operations resulting in an enterocutaneous fistula.  She has had  bilateral hydronephrosis found September 2004, stents were passed October  2004.  They were changed January 2005 and enters now for replacement of  these.  At the end of the three months, she gets some bilateral flank pain,  right greater than left.  The stents were a little bit difficult to pass  each time that were 6 French x 24 cm length.   The patient was placed on the operating table in dorsal lithotomy position  and after satisfactory induction of general anesthesia was prepped and  draped with Betadine in the usual sterile fashion and given antibiotics.   The cystoscope was inserted, the right stent was seen, grasped and pulled  outside the urethral opening.  A tight opacity 0.38 floppy tip guide wire  through this but it did not go past the mid ureter due to encrustation.  We  then replaced the cystoscope and  then passed along side the stent the  guidewire so that I would not lose access to the ureter.  I then began to  slowly pull the ureter which was VERY tight.  There were apparently some  encrustations on the catheter and I had to be very careful I did not break  the catheter in trying to pull this out. It really got stuck down in the  distal third of the ureter where again with very gentle and continuous  pressure caused it to pop through.  In doing so, all these stones that were  encrusting around the stent were impacted in the ureter along side the  stent.  I then had the OR staff get the holmium laser and the ureteroscopy  ready as I put the cystoscope back in and removed the left stent with  grasping forceps pulled it outside the urethral meatus.  I was able to pass  a guidewire up this one up to the level of the kidney  under fluoroscopy and  then remove the stent.  I back loaded the guidewire over the cystoscope and  then was able to replace this time a 7 French x 24 cm length double J  ureteral stent.  This seemed to be in good position. The guidewire was then  removed.  Then using the 6 short ureteroscope in the bladder, I was able to  with the holmium laser begin to break up the packed ureter which was full of  stones from the encrusted catheter.  I was able to get up to the pelvic  brim.  A spot film showed this where there was a marked narrowing of the  ureter but I did not force the scope through that point.  I was able to  break the stones into tiny pieces then.  Then using the cystoscope, I back  loaded the guidewire and was able to get a 7 French x 24 cm length double J  stent up there but it seemed to be a little low.  It seemed to coil up in  what might have been then proximal ureter or maybe the renal pelvis.  So  then I pulled out the cystoscope, put an opened ended 6 ureteral catheter up  to the level of the kidney and then removed the guidewire and then did a  retrograde  which showed marked hydronephrosis on the right.  This at least  gave me a picture where the renal pelvis was so I was able to then repass  the guidewire through the open ended catheter and then placed a 7 French x  24 cm double J ureteral stent which seemed to be in good position.  It  seemed to fit a little bit better in the bladder than it did before.  The  bladder was then drained, she was given some Toradol and a B&O suppository  and was taken to the recovery room in good condition.  The plan is to keep her on some Uracid K to try to keep this from happening  and probably change the stents in two months rather than three at least next  time to see that this does not happen again.                                               Rozanna Boer., M.D.    HMK/MEDQ  D:  11/12/2003  T:  11/12/2003  Job:  [id number]

## 2010-12-24 NOTE — Op Note (Signed)
   NAME:  [name], [name]                         ACCOUNT NO.:  [id number]   MEDICAL RECORD NO.:  [id number]                   PATIENT TYPE:  INP   LOCATION:  0378                                 FACILITY:  [facility]   PHYSICIAN:  Lorre Munroe., M.D.            DATE OF BIRTH:  08/02/1928   DATE OF PROCEDURE:  04/09/2003  DATE OF DISCHARGE:                                 OPERATIVE REPORT   PREOPERATIVE DIAGNOSIS:  Abdominal abscess.   POSTOPERATIVE DIAGNOSIS:  Abdominal abscess.   OPERATION:  Incision and drainage of abdominal abscess.   SURGEON:  Lebron Conners, M.D.   ANESTHESIA:  General.   DESCRIPTION OF PROCEDURE:  After the patient had induction of general  anesthesia and routine preparation and draping of the abdomen, I palpated  the induration and found the site of fluctuance on the anterior abdominal  wall and the left lower quadrant.  I made a 3 cm incision there and  dissected down through the subcutaneous tissues and then bluntly entered an  abscess cavity.  It was under some pressure.  It contained mostly gas but  some pus as well.  It was not very large and there was a great deal of  induration of the abdominal wall, but I could easily take my finger and put  it down into the pelvic cavity, and I moved it around a bit and found some  more pus.  I took cultures.  I irrigated it out thoroughly.  I saw no  evidence of any connection to the GI tract.  There was simply gas and pus in  the abscess cavity.  It had a very chronic scarred appearance to the abscess  wall.  After thoroughly irrigating it and making sure there was good  hemostasis, I packed the cavity loosely with gauze and applied a bulky  bandage.  She tolerated the operation well.                                                Lorre Munroe., M.D.    WB/MEDQ  D:  04/09/2003  T:  04/09/2003  Job:  [id number]   cc:   Georgina Quint. Plotnikov, M.D. [facility]

## 2010-12-24 NOTE — Discharge Summary (Signed)
NAME:  [name], [name]                         ACCOUNT NO.:  [id number]   MEDICAL RECORD NO.:  [id number]                   PATIENT TYPE:  INP   LOCATION:  8295                                 FACILITY:  [facility]   PHYSICIAN:  Rene Paci, M.D. [facility]          DATE OF BIRTH:  18-May-1928   DATE OF ADMISSION:  04/08/2003  DATE OF DISCHARGE:  04/18/2003                                 DISCHARGE SUMMARY   DISCHARGE DIAGNOSES:  1. Early sepsis secondary to intra-abdominal abscess, status post surgical     irrigation and debridement on April 09, 2003, resolved.  2. Persistent colocutaneous fistula on low residual diet with TNA     supplementation, anticipating spontaneous closure, close outpatient     surgical followup.  3. Acute on chronic diarrhea, improved, secondary to radiation colitis,     continue home dose Lomotil.  4. Iron-deficiency anemia, status post 2 units of packed red blood cells     plus IV iron x1.  Discharge hemoglobin 10.5.  5. Persistent hypokalemia secondary to above, resolved on TNA.  6. History of colon cancer in 1989, status post resection in 1989.  Right     hemicolectomy secondary to perforation in December 2003, with ileostomy.     Ileostomy closure with rectosigmoid anastomosis in May 2004, with partial     small bowel resection.  7. History of endometrial cancer, remote.   DISCHARGE MEDICATIONS:  Prior to admission and include:  1. Lomotil one to two p.o. b.i.d. p.r.n. diarrhea.  2. Iron 325 mg p.o. b.i.d. with food.  3. Calcium supplements plus vitamin D.  4. Multivitamin p.o. q. day.  5. Over-the-counter ibuprofen as needed p.r.n. pain.   DISPOSITION:  The patient is being discharged home with home health to  provide minimum 10 days TNA supplementation to prevent malnutrition while  allowing time for her fistula closure.   HOSPITAL FOLLOWUP:  With her surgeon, Dr. Jamey Ripa, of [facility], in  two weeks.  The patient will call for appointment.   She will also be seen by  her primary care physician, Dr. Sonda Primes, as previously scheduled for  routine maintenance, as well as her GI doctor, Dr. Lina Sar, appointment  to be scheduled by patient after discussion with Dr. Jamey Ripa.   CONDITION ON DISCHARGE:  Medically stable and improved.   HOSPITAL COURSE:  #1 -  EARLY SEPSIS SECONDARY TO ABDOMINAL ABSCESS:  The  patient is a pleasant 77 year old woman with a history of multiple bowel  surgeries as briefly outlined above, who presented to her primary care  physician's office the day of admission secondary to weakness and pain.  She  was found to be mildly hypotensive with a systolic pressure in the 90's and  tachycardic.  Due to concerns for dehydration and/or anemia, she was sent to  [facility] for further evaluation.  She had a CT of the abdomen and pelvis  performed on day  of admission which showed fluid and gas collection in the  left lower quadrant consistent with abscess.  She was scheduled for an I&D  by Dr. Orson Slick on April 09, 2003, which was done without complications.  However, in the following days, the patient continued to have drainage of  her abscess which was subsequently diagnosed as a sigmoid cutaneous fistula.  She was covered with IV Unasyn and had resolution of her admission white  count and fever without complications.  She was treated for a total of nine  days, and with surgical guidance felt that the fistula was unlikely to cause  infection and was likely to close spontaneously with bowel rest and TNA  supplementation.  As such, these have been advised by nutrition and arranged  by home health pharmacy and nursing.  At the time of discharge, her fistula  has been non-draining for two days, and Dr. Jamey Ripa feels that she is stable  to discharge home with close outpatient followup as described above.  #2 -  ACUTE ON CHRONIC DIARRHEA:  The patient was seen in consultation by GI  doctor,  Dr. Juanda Chance,  during this hospitalization.  Dr. Juanda Chance helped guide decisions  regarding TNA supplementation and treatment of her diarrhea.  She was tried  temporarily on Rowasa for possible inflammatory colitis, but this was not  felt to improve her symptoms and was discontinued after three days of  empiric treatment.  She is now back to her baseline with one to two loose  stools daily, and taking Lomotil as previously prescribed.  No further  workup or treatment.  #3 -  IRON-DEFICIENCY ANEMIA:  Suspect patient has a known absorption  deficiency with her bowel surgeries causing her recurrent iron-deficiency.  She had an anemia with a hemoglobin of 7.6 at time of admission.  Iron  studies consistent with iron-deficiency despite iron supplementation orally  four months previous.  She was transfused 2 units PRBC's with good results  during this hospitalization and given a dose of IV iron during this  hospitalization.  Followup per primary care physician and GI for further  evaluation of bowel malabsorption possibility.                                                Rene Paci, M.D. [facility]    VL/MEDQ  D:  04/18/2003  T:  04/18/2003  Job:  [id number]

## 2010-12-24 NOTE — Op Note (Signed)
[name], [name]               ACCOUNT NO.:  [id number]   MEDICAL RECORD NO.:  [id number]          PATIENT TYPE:  AMB   LOCATION:  NESC                         FACILITY:  [facility]   PHYSICIAN:  Courtney Paris, M.D.DATE OF BIRTH:  10/28/1927   DATE OF PROCEDURE:  05/24/2006  DATE OF DISCHARGE:                                 OPERATIVE REPORT   PREOPERATIVE DIAGNOSIS:  Bilateral ureteral obstruction.   POSTOPERATIVE DIAGNOSIS:  Bilateral ureteral obstruction, extraperitoneal  bladder leak versus a rectal vesicle fistula.   PROCEDURE:  Cystoscopy, right retrograde pyelogram and change bilateral  ureteral stents, insert Foley catheter.   ANESTHESIA:  General.   SURGEON:  Courtney Paris, M.D.   BRIEF HISTORY:  This 77 year old lady has bilateral ureteral obstruction.  She has had stents since October 2004.  Last changed March 2007.  She has  problems calcium buildup on the ends of the stents and has had to be changed  in less than 3 months.  We started using specially coated stents in hope  that this would help but she started having some right flank pain earlier  this week but no fever.  Her history goes back quite awhile.  She had a  history of endometrial cancer with hysterectomy and x-ray therapy 1970.  In  1989 was treated with colon cancer with hemicolectomy an A and P resection.  She later had a fecal perforation during a colonoscopy and had subsequent  multiple cutaneous enteral fistula that eventually closed after an ileostomy  that was subsequently taken down.  She enters now for change the stents and  cystoscopy.   The patient was placed in the operating table in dorsal lithotomy position  after satisfactory induction of general anesthesia, was prepped and draped  in the usual sterile fashion.  She is given IV antibiotics.  The  panendoscope was inserted and there seemed to be a lot of debris in the  bladder as it was before.  The right ureteral stent was  grasped and pulled  outside the urethral meatus and I tried to get stents through this but it  was completely occluded and I could not do so.  I ended up having to take  the stent out and then using the cystoscope passed a sensor guidewire up to  the proximal ureteral obstruction.  Then passed an open-ended 6 ureteral  catheter over this and performed a retrograde.  She had to areas of  narrowing one distal and one up near the proximal UPJ.  I was able to get  some dye through this and then using the sensor catheter was able to  negotiate the UPJ narrowing and get up into the renal pelvis under  fluoroscopy.  The system was dilated as usual.  I then passed the open-ended  six ureteral catheter up into the renal pelvis and removed and performed  another retrograde demonstrated that it was indeed in the renal pelvis and  it was dilated.  Through the open-ended catheter a 0.038 floppy tip  guidewire was then passed and the open-ended catheter removed.  Over the  guidewire  a 7 x 24 cm contour ureteral stent was then passed with a nice  coil in the renal pelvis as the guidewire was removed.  The left ureteral  stent was then grasped and pulled outside the urethral meatus and again I  could not get a catheter through this so I pulled out the catheter, the  obstructed stent and then under direct vision passed a sensor guidewire up  the ureter to the level of the kidney under fluoroscopy.  Over the guidewire  a new 7 x 24 cm stent was then passed and the guidewire was then removed.  I  noticed on the fluoroscopy, however, that there seemed to be some  extraperitoneal leakage of dye.  This seemed to be small hole in the midline  about 10-French or so in the posterior midline.  I then placed a catheter  and she went to recovery room in good condition.   I had Dr. Jamey Ripa, who was in the operating room, review the films and he had  been involved with all of her other operations in the past and it  looked  like it although may be extraperitoneal rupture, may also be a vesicocolonic  fistula from her previous radiation and other operations.  This would make  sense if with her worsening of her diarrhea that she had had previously.  I will put some methylene blue in her Foley catheter see to comes out from  her rectum as a cheap test to find out if this is the case.      Courtney Paris, M.D.  Electronically Signed     HMK/MEDQ  D:  05/24/2006  T:  05/25/2006  Job:  [id number]

## 2010-12-24 NOTE — Op Note (Signed)
NAMECRYSTIN, [name]               ACCOUNT NO.:  [id number]   MEDICAL RECORD NO.:  [id number]          PATIENT TYPE:  AMB   LOCATION:  NESC                         FACILITY:  [facility]   PHYSICIAN:  Rozanna Boer., M.D.DATE OF BIRTH:  [birth year]   DATE OF PROCEDURE:  09/08/2004  DATE OF DISCHARGE:                                 OPERATIVE REPORT   PREOPERATIVE DIAGNOSIS:  Bilateral ureteral obstruction (not stone).   POSTOPERATIVE DIAGNOSIS:  Bilateral ureteral obstruction (not stone).   OPERATION:  Cystoscopy, removal and exchange bilateral ureteral stent.   ANESTHESIA:  General anesthesia.   SURGEON:  Courtney Paris, M.D.   This 77 year old patient was admitted with endometrial cancer and bilateral  ureteral obstruction.  Also had colon cancer in 1989 with fecal perforation,  enterocutaneous fistula after many operations.  She has had stents since  October 2004, the last change was November 2005.  She has done well except  for some back pain she has had in the last week or so.   MEDICATIONS:  Lomotil, calcium and Questran.   She has two areas of obstruction on the right at the ureteropelvic junction  also at the pelvic brim and also on the left at the pelvic brim where there  is a large phlegmon.   The patient was placed on the operating table in the dorsal lithotomy  position after satisfactory induction of general anesthesia, was prepped and  draped with Betadine and given IV antibiotics.  Cystoscope was used to enter  the bladder which was inspected, free of any mucosal lesions.  The stent  coming from the right orifice was grasped and pulled outside the urethral  meatus.  Under fluoroscopy a guidewire was passed up the stent to the level  of the kidney and the stent was then removed.  The guidewire was then back  loaded over cystoscope and a new 7 x 24 cm length double J ureteral stent  was then passed over the guidewire.  The guidewire was removed leaving  a  nice coil in the renal pelvis and one in the bladder.  Then the stent seen  coming from the left orifice was grasped and pulled outside the urethral  meatus and again a 0.038 floppy tip guidewire was passed under fluoroscopy  up through the stent into the renal pelvis and then the stent was removed.  The guidewire was then back loaded over cystoscope and a new 7 French x 24  cm double J ureteral stent was positioned.  As the guidewire was removed,  there was a nice coil in the renal pelvis and one also in the bladder.  These were adjusted slightly with grasping forceps and the bladder drained.  The patient was given 15 mg of Toradol IV and taken to the recovery room in  good condition where she will be later discharged as an outpatient.  Will  plan to change these again in about three months.    HMK/MEDQ  D:  09/08/2004  T:  09/08/2004  Job:  [id number]

## 2010-12-24 NOTE — Consult Note (Signed)
NAME:  [name], [name]                         ACCOUNT NO.:  [id number]   MEDICAL RECORD NO.:  [id number]                   PATIENT TYPE:  INP   LOCATION:  0378                                 [facility]ACILITY:  [facility]   PHYSICIAN:  Susanne Borders, MD                     DATE OF BIRTH:  September 16, 1927   DATE OF CONSULTATION:  04/09/2003  DATE OF DISCHARGE:                                   CONSULTATION   CONSULTING PHYSICIAN:  Courtney Paris, M.D.   REASON FOR CONSULTATION:  Bilateral hydronephrosis.   HISTORY OF PRESENT ILLNESS:  The patient is a 77 year old female with a  history of endometrial cancer, status post hysterectomy and external beam  radiation in the 1970's.  In 1989, the patient was diagnosed with colon  cancer and underwent a hemicolectomy.  She had been doing quite well from a  cancer control standpoint.  However, last year the patient had a fecal  perforation after colonoscopy requiring multiple abdominal surgeries.  The  patient has recently been admitted for weakness and dehydration, and was  found to have a pelvic abscess which was drained today.  We were asked to  see this patient for her bilateral hydronephrosis which was originally  demonstrated on a CT scan in May 2004.  The patient had a CT scan on April 08, 2003, which demonstrates stable bilateral hydronephrosis with the right  being greater then the left.  The patient denies genitourinary symptoms.  Specifically, the patient has one to two urinary tract infections over the  past year.  She denies flank pain or history of renal calculi.  She denies  hematuria.  She denies irritative or obstructive voiding symptoms.  She  denies any urinary incontinence.   PAST MEDICAL HISTORY:  Above.   PAST SURGICAL HISTORY:  Above.   FAMILY HISTORY:  No genitourinary disease.   SOCIAL HISTORY:  The patient is an ex-smoker.  She denies alcohol use.   REVIEW OF SYSTEMS:  As above, otherwise negative for all systems.   CURRENT MEDICATIONS:  1. Protonix.  2. Unasyn.  3. Lomotil.  4. Restoril.  5. Morphine.  6. Maalox.  7. Tylenol.   ALLERGIES:  No known drug allergies.   PHYSICAL EXAMINATION:  VITAL SIGNS:  Temperature 98.7, pulse 91,  respirations 18, blood pressure 97/45.  GENERAL:  Alert and oriented x3.  No acute distress.  HEENT:  Head is normocephalic, atraumatic.  Oropharynx is clear without  erythema or exudate.  NECK:  Supple without masses.  LUNGS:  Clear to auscultation bilaterally.  HEART:  Regular rate and rhythm without murmurs, rubs, or gallops.  ABDOMEN:  Soft, appropriately tender.  There is a drain in the left lower  quadrant.  GENITOURINARY:  Deferred.  EXTREMITIES:  Warm and well perfused.  NEUROLOGIC:  No focal motor or sensory deficits.   RADIOLOGY:  CT scan on April 08, 2003, was independently reviewed and  demonstrates a moderate right hydroureteronephrosis down to the level of the  pelvic brim at which point the ureter becomes obscured in a pelvic  inflammatory process.  There is also minimal hydroureteronephrosis on the  left side, again with the course of the ureter being lost at the point of  the true pelvis.   IMPRESSION:  Bilateral hydroureteronephrosis.  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  At any rate, the patient has no symptoms and normal creatinine  and normal bilateral renal parenchyma.  Therefore, acute intervention is not  warranted at this time.  The intervention may be necessary if the patient  had an increasing creatinine or if her hydronephrosis worsens, or if she  develops flank pain.  The patient should undergo repeat renal  ultrasonography in Dr. Valda Lamb office in four to six months.                                                Susanne Borders, MD    DR/MEDQ  D:  04/09/2003  T:  04/09/2003  Job:  [id number]

## 2010-12-24 NOTE — Op Note (Signed)
NAME:  [name], [name]                         ACCOUNT NO.:  [id number]   MEDICAL RECORD NO.:  [id number]                   PATIENT TYPE:  INP   LOCATION:  0354                                 FACILITY:  [facility]   PHYSICIAN:  Courtney Paris, M.D.          DATE OF BIRTH:  [birth year]   DATE OF PROCEDURE:  05/21/2003  DATE OF DISCHARGE:  05/22/2003                                 OPERATIVE REPORT   PREOPERATIVE DIAGNOSIS:  Bilateral hydronephrosis.   POSTOPERATIVE DIAGNOSIS:  Bilateral hydronephrosis.   PROCEDURE:  1. Cystoscopy.  2. Bilateral retrograde pyelogram.  3. Bilateral double-J ureteral stent.   SURGEON:  Courtney Paris, M.D.   ASSISTANT:  Susanne Borders, M.D.   ANESTHESIA:  General endotracheal.   DRAINS:  Bilateral 24 cm x 6-French double-J ureteral stents.   COMPLICATIONS:  None.   INDICATIONS FOR PROCEDURE:  Mrs. Fariss is a 77 year old female with a history  of pelvic radiation in the 1970s for endometrial carcinoma.  She also  underwent hemicolectomy for colon cancer in the 1980s.  Recently, she had a  colonoscopy which was complicated by a colonic perforation.  Subsequently,  the patient underwent exploratory laparotomy with colon resection.  She has  had bowel leak and has had multiple abdominal surgeries.  She recently has  developed an enterocutaneous fistula.  All of these abdominal surgeries and  bowel leaks have lead to accumulation of multiple pelvic abscesses and  currently a pelvic phlegmon.  Since May, the patient has had bilateral  hydronephrosis which we have been following.  The patient was recently  hospitalized in September and she had a CT scan at that time which  demonstrated stable bilateral hydronephrosis.  The patient was recently  admitted as she began having increased drainage from an enterocutaneous  fistula which was previously closed.  Repeat CT scan demonstrated worsening  right hydronephrosis.  Subsequently, the patient  underwent a renogram which  demonstrated bilateral Lasix T-1/2s that were in the obstructive range.  After discussing this in detail with the patient, we recommended bilateral  ureteral stent placement and bilateral retrograde pyelography to assess her  ureters and to protect her kidneys from longstanding obstruction.  She  understood this and consented to this after understanding the risks,  benefits and alternatives.   DESCRIPTION OF PROCEDURE:  The patient was brought to the operating room and  correctly identified by her identification bracelet.  She was given  preoperative antibiotics and general endotracheal anesthesia.  Her genitalia  were prepped and draped in the typical sterile fashion.   A 22-French cystoscope with a 12 degree lens was used to enter the patient's  urethra.  The urethra was within normal limits.  The bladder mucosa was free  of tumors, foreign bodies or stones.  Bilateral ureteral orifices were  located on the trigone in their normal anatomical location.  No efflux of  urine was appreciated in the time that  we were visualizing the ureters.  Careful systematic survey of the entire bladder urothelium with 12 degree  and 70 degree lenses was carried out and again the bladder mucosa was within  normal limits.   The right ureter was cannulated with a 6-French ureteral catheter and right  retrograde pyelogram was performed.  This demonstrated two areas with  transition points that were worrisome for a narrowing.  The first area was  at the ureteropelvic junction.  The patient also had a very dilated right  renal pelvis with distended calyces.  This was consistent with a classic  ureteropelvic junction configuration.  The patient also had an area of  narrowing at her pelvic brim which was consistent with a known location of  the pelvic phlegmon.  A 0.38 mm guidewire was passed through the ureteral  stent.  The stent was removed.   A 24 cm 6-French double-J ureteral  catheter was passed over the wire with  good curl seen in the right renal pelvis.  This visualized under fluoroscopy  and another distal curl is visualized directly with a cystoscope.  The left  ureteral orifice was then cannulated with the ureteral catheter and a left  retrograde pyelogram was performed.  There was no distention of the left  ureter.  There was moderate hydronephrosis.  There was a mild transitional  point again in the area of the pelvic brim consistent with a phlegmon in  this location.  Again a double-J ureteral stent was passed over a 0.38 mm  guidewire with a good curl seen in the left renal pelvis under fluoroscopy  and in the bladder directly.   The bladder was drained and the patient was allowed to awaken from her  anesthesia.  She was taken to the Postanesthesia Care Unit in stable  condition.   Please note that Dr. Aldean Ast was present and participated in all aspects  of this case as he was primary Careers adviser.     Susanne Borders, MD                           Courtney Paris, M.D.    DR/MEDQ  D:  05/22/2003  T:  05/22/2003  Job:  [id number]

## 2010-12-24 NOTE — Op Note (Signed)
[name], [name]               ACCOUNT NO.:  [id number]   MEDICAL RECORD NO.:  [id number]          PATIENT TYPE:  AMB   LOCATION:  NESC                         FACILITY:  [facility]   PHYSICIAN:  Rozanna Boer., M.D.DATE OF BIRTH:  02/06/1928   DATE OF PROCEDURE:  12/07/2004  DATE OF DISCHARGE:                                 OPERATIVE REPORT   PREOPERATIVE DIAGNOSES:  Bilateral ureteral obstruction.   POSTOPERATIVE DIAGNOSES:  Bilateral ureteral obstruction.   OPERATION:  Cysto removed and exchange bilateral ureteral stents.   ANESTHESIA:  General.   SURGEON:  Courtney Paris, M.D.   BRIEF HISTORY:  This 77 year old lady has bilateral hydronephrosis for stent  change. She had endometrial cancer status post hysterectomy and radiation  therapy in the 1970's and in 1989 had colon cancer with hemicolectomy with a  fecal perforation after colonoscopy that required multiple abdominal  operations. 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 She has two areas of obstruction on the right, one at the UPJ  and one the pelvic brim and on the left also at the pelvic brim where the  phlegmon is present. The last stent was changed September 08, 2004.   The patient was placed on the operating table in the dorsal lithotomy  position and after satisfactory induction of general anesthesia was prepped  and draped with Betadine in the usual sterile fashion and given IV  antibiotics. The panendoscope was inserted with the camera attached, the  bladder was inspected, it was free of any mucosal lesions except some  yellowish cyst typical of chronic cystitis cystica, the largest of which was  in the posterior dome of the bladder. Stents were seen coming out from each  orifice. They were a little bit encrusted with some stones. There was some  edema and some inflammatory polyps at both  ureteral orifices. First the  right side was grasped and pulled outside the urethral meatus. A guidewire  was passed through this under fluoroscopy and at the kidney end was a little  bit tight but I was able to break through. I was then able to pull the stent  but it was kind of tight as it came through the UV junction and when it came  out there were some small yellowish stones attached to this that came off  into the bladder. Over the guidewire, a new 7 x 24 cm length double-J  ureteral stent was passed under fluoroscopy so as the guidewire was removed  there was a nice coil in the renal pelvis and one in the bladder. It  corresponded to the same spot that was on the preoperative film. Next, the  left ureteral stent was grasped with forceps and pulled outside the urethral  meatus and again a 0.038 floppy-tip guidewire was passed through this up  into the kidney under fluoroscopy and this seemed to go somewhat easier. The  stent was removed and the scope was back loaded on the guidewire and a new 7  x 24  cm ureteral stent double-J was then passed and under fluoroscopy the  guidewire was removed with a nice coil in the renal pelvis and one in the  bladder as seen on the preoperative films. The bladder was drained, the  patient given a B&O suppository and 15 mg of Toradol  and will be sent home  as an outpatient. I  will treat her with some antibiotics for five days but since she can not  take Urocit K, I really want to stress to her the importance of drinking  lemonade as there were some stones on the stent that I had to pick out on a  previous stent change. Will continue to this every three months for now.      HMK/MEDQ  D:  12/07/2004  T:  12/07/2004  Job:  [id number]

## 2010-12-24 NOTE — H&P (Signed)
[name], SHARPS               ACCOUNT NO.:  [id number]   MEDICAL RECORD NO.:  [id number]          PATIENT TYPE:  INP   LOCATION:  4713                         FACILITY:  [facility]   PHYSICIAN:  Titus Dubin. Hopper, MD,FACP,FCCPDATE OF BIRTH:  [birth year]19[birth year]   DATE OF ADMISSION:  08/05/2006  DATE OF DISCHARGE:                              HISTORY & PHYSICAL   SUBJECTIVE:  [name] is a 77 year old white female brought to the  emergency room because of abdominal pain, profound weakness, and failure  to thrive.   She was hospitalized December 5th to July 30, 2006, with an  diverting left colostomy on July 12, 2006 for colovesical fistula.  She had a colostomy revision July 17, 2006.  Hospitalization was  complicated by ileus postop and a urinary tract infection, hypokalemia,  and anemia.  She also had a diagnosis of malnutrition.  Other surgery  and procedures during the hospitalization include colonoscopy and  flexible sigmoidoscopy.  She had bilateral nephrostomy tubes changed on  July 20, 2006.  She was noted to have a small peristomal abscess.   The diverting colostomy was done due to nonhealing fistula.  TNA was  initiated July 15, 2006 for the protein caloric malnutrition.   She was discharged on Cipro and Augmentin;but she was unable to swallow  the Augmentin due to the size of the pill.  She also was on Percocet and  Vicodin.   She was discharged on July 30, 2006 and began to have pain on  July 31, 2006.  It progressed to the point that it became constant.  By the time of admission it was described as an 8 on a 10 scale around  the colostomy site.  She has been unable to eat or drink sufficiently  due to the anorexia.  She has become progressively weak to the point she  was unable to stand or ambulate.  The pain was poorly responsive to  Vicodin and Percocet.  The pain was described as nonradiating and  burning and throbbing.   Dr. Tenna Child nurse  was contacted on August 04, 2006 by phone; an  office visit was recommended but the patient did not feel she was able  physically to come to the office.  She was advised to go to the  emergency room should the symptoms persist or progress; & an office  visit was scheduled for August 07, 2006.   She denies any dyspepsia, nausea, or vomiting, melena or rectal  bleeding.  She has had liquid stool from the rectum.  She has had no  flank pain at the site of nephrostomy tubes.   PAST MEDICAL HISTORY:  Her past medical history includes the  vesicocolonic fistula and bilateral ureteral obstruction requiring the  bilateral nephrostomies by Dr. Aldean Ast.  She has  had multiple  procedures for the enterocutaneous fistulas in the past.  Past history  also includes tonsillectomy and adenoidectomy.  In 1977 she had a total  abdominal hysterectomy and bilateral salpingo-oophorectomy for cancer  while living in [place].  She had colon surgery for cancer in 1989  by Dr. Jamey Ripa.  She has had bilateral cataract surgery.   ALLERGIES:  SHE HAS NO KNOWN DRUG ALLERGIES.   MEDICATIONS ARE AS LISTED AND INCLUDE:  1. Cipro.  2. Percocet.  3. Temazepam.  4. Vicodin.   SOCIAL HISTORY:  She does not smoke and rarely drinks.   FAMILY HISTORY INCLUDE:  Hypertension in her mother.  Renal disease in  maternal grandmother.  Heart attack in her father.   REVIEW OF SYSTEMS:  A complete review of systems was conducted.  She has  no otolaryngologic symptoms.  She has no pulmonary symptoms.  GI and genitourinary review of systems are listed above.  The rectal discharge which is light tan and mucoid, initially started 2  days prior to admission and lasted 24 hours.  It also recurred the day  of admission.  She had been on hyperalimentation at the hospital.  She states that she  did not have access to ostomy care as an outpatient due to a lack of  coverage in her home area.  She stated the anorexia was not  related to her pain medicines and it was  not related to Augmentin as she was not taking it.  She denied any fever, chills, or sweats despite being off the Augmentin.   OBJECTIVE:  On exam, she appears chronically ill and tachypneic.  There  is generalized wasting of the temporal muscles and limbs.  Vital signs:  Blood pressure is 104/69, temperature 97.6, pulse varies from 108-112,  O2 sats are 97% on 2 liters.  Lenses are clear; arteriolar narrowing is  present.  She has an upper partial.  Oropharynx is dry and slightly  erythematous.  She has no lymphadenopathy about the head and neck or  axilla.  There is increased cerumen in the right otic canal.  Nares are  patent.  She has no lymphadenopathy about the neck.  Thyroid is not  palpable.  Breath sounds are decreased.  Bowel sounds are present and  active.  She is exquisitely tender over the left upper quadrant and left  lower quadrant around the stoma.  There is not significant tenderness on  the right.  She has no edema; pedal pulses are decreased.  Nephrostomy  tubes are in place.  Skin is warm and dry and she has general pallor.  Her heart rhythm is slightly irregular but there are no sustained  dysarthrias noted.  She has an upper partial; dental hygiene is fairly  good.   The ER record was reviewed and labs compared to those of July 27, 2006.  Her white count is 10,200; it was 8100 on July 27, 2006.  Hemoglobin and hematocrit are 11.7 and 36,4; they were 9.7 and 30.5 on  July 27, 2006.  She has small leucocyte esterase in the urine and  many bacteria.  A culture has been ordered.   Sodium is now 131; it was previously 138.  Creatinine remains WNL but  this may be a result of her cachexia. BUN is now 53;it was previously 20  suggesting a prerenal azotemia.   Her albumin 2.3 with normal's over 3.5.  SGOT is 27; prior values are 32.  Her SGPT has risen from 24 to 48.  Alkaline phosphatase has risen  from 124 to 283.   A total bilirubin remains within normal limits.   She will be admitted, an IV started for pain control.  A PICC line will  be ordered.  General surgery will be consulted and nutritional orders  deferred to  them & nutrition.  A CAT scan of the abdomen and pelvis will  be performed.      Titus Dubin. Alwyn Ren, MD,FACP,FCCP  Electronically Signed     WFH/MEDQ  D:  08/05/2006  T:  08/06/2006  Job:  [id number]   cc:   Kathrynn Humble, Dr.  Courtney Paris, M.D.  Currie Paris, M.D.

## 2010-12-24 NOTE — Op Note (Signed)
NAMELORRETTA, [name]               ACCOUNT NO.:  [id number]   MEDICAL RECORD NO.:  [id number]          PATIENT TYPE:  AMB   LOCATION:  NESC                         [facility]ACILITY:  [facility]   PHYSICIAN:  Courtney Paris, M.D.DATE OF BIRTH:  [birth year]   DATE OF PROCEDURE:  10/26/2005  DATE OF DISCHARGE:                                 OPERATIVE REPORT   PREOPERATIVE DIAGNOSIS:  Bilateral ureteral obstruction (not stone).   POSTOPERATIVE DIAGNOSIS:  Bilateral ureteral obstruction (not stone).   OPERATIONS:  Cysto, right retrograde pyelogram, exchange bilateral ureteral  stents.   ANESTHESIA:  General.   SURGEON:  Courtney Paris, M.D.   BRIEF HISTORY:  This 77 year old white female was admitted with bilateral  ureteral obstruction for stent change. She has had stents since October 2004  when this was first found. The last change was August 24, 2005. She began  have some right flank pain about 3 days ago with some low grade chills and  came in today with a 100.7 degree fever. It was about this time that the  stones obstruct from calcifications on them. She had a history of  endometrial cancer post radiation therapy and hysterectomy in the 1970s.  Then she developed colon cancer in 1989 and after a hemicolectomy and cecal  perforation after a colonoscopy has had an enterocutaneous fistula on the  left lower quadrant that is finally sealed with glue injected by Dr. Jamey Ripa.  She has a very tortuous right  UP junction and enters to have the stents  changed at this time. Her medicines include Lomotil, Cholestram, and Vicodin  for pain p.r.n. She has had no cardiac or pulmonary symptomatology and has  had essentially no drainage from the fistula since the glue injection.   The patient was placed on the operating table in the dorsal lithotomy  position and after satisfactory induction of general anesthesia was prepped  and draped with Betadine and the given IV Ancef. The cystoscope  was placed  in the bladder and the bladder inspected. There was a lot of debris and the  ends of the stents in the bladder were very the encrusted. The right side  was grasped with forceps and pulled outside the urethral meatus and then  with a sensor guidewire I was able to get this passed through the end of the  stent up to the level of the UP junction. I could not get it to go back into  the renal pelvis, however. The stent was then removed and over the guidewire  I passed an open-ended six ureteral catheter and removed the guidewire.   I injected some contrast material which showed a large stone and a dilated  proximal ureter and several filling defects within the kidney and a very  tortuous UP junction that was somewhat narrowed. Under fluoroscopy, I was  able to negotiate this sensor wire back into the renal pelvis and then  removed the open-ended catheter. Over the guidewire, I then advanced a 7-  [place] Polaris 24 cm stent under fluoroscopy to the level of the renal  pelvis, removed the guidewire and  there was a coil in the renal pelvis which  seemed to be in good position. In pulling this out, however, the strings on  the distal end of the stent inadvertently came out through the urethral  meatus and I had to adjust the catheter with grasping forceps to adjust it  so that the coiled end of the stent was up in the renal pelvis.   I then turned attention to the left side where I was able to grasp the stone  encrusted bladder end of the left ureteral stent, pull this outside the  urethral meatus and again got tangled in the string on the right side which  had to be then be re-manipulated. When this was done, I was able to get the  sensor wire up the left ureteral stent to the level of the renal pelvis and  then remove the stent. Then over the guidewire I passed a 6-French 24 cm  Polaris stent up to the level of the renal pelvis. I adjusted both of this  so that the stent was just  coming out of each orifice with the strings in  the bladder in a satisfactory manner and on fluoroscopy is seemed that the  stents were in good position. The bladder was then drained. She was given a  B&O suppository and  taken to the recovery room in good condition where she  will be later discharged as an outpatient. We are going to try to let these  stents go 4 months before they are changed unless she has flank pain or  fever and then even longer if this is more successful. I will change her  Caltrate to Citracal, continue the fluids as before.      Courtney Paris, M.D.  Electronically Signed     HMK/MEDQ  D:  10/26/2005  T:  10/26/2005  Job:  [id number]

## 2010-12-24 NOTE — H&P (Signed)
[name], HARMS               ACCOUNT NO.:  [id number]   MEDICAL RECORD NO.:  [id number]          PATIENT TYPE:  INP   LOCATION:  0155                         FACILITY:  [facility]   PHYSICIAN:  Courtney Paris, M.D.DATE OF BIRTH:  [birth year]   DATE OF ADMISSION:  05/25/2006  DATE OF DISCHARGE:                                HISTORY & PHYSICAL   BRIEF HISTORY:  This [age]-year-old lady is admitted with shaking chills,  possible sepsis for treatment and care.  She has a long history that goes  back many years.  She had history of endometrial cancer, status post  hysterectomy and x-ray therapy 1970 and then had radiation.  She had a colon  resection for cancer in 1989, in 2004 had a perforated colon on colonoscopy.  She ended up getting a ileostomy and then had the ileostomy reversed with a  enterocutaneous fistula that lasted for several years.  She had numerous  stents beginning in 2004, when she presented with a pelvic abscess and  bilateral hydronephrosis at that time.  Been changing her stents every three  months or so and did it again yesterday and when I did so, there was a small  hole in the posterior midline right just behind the trigone where possibly  the stents had eroded through into the rectum.  Changed the stents, put in a  Foley catheter which drained urine for overnight but then today there was no  further drainage and she had urine coming out of her rectum.  I brought her  into the hospital where she had bilateral percutaneous nephrostomy tubes  placed and then brought her to my office to remove the stents today.  At the  completion of this, she had shaking chills and blood pressure was low and I  decided it would be best to admit her overnight to treat for possible GU  sepsis.   ALLERGIES:  She has no allergies.   MEDICATIONS:  She is on Vicodin for pain and had been on some cephalexin.  She takes Lomotil and cholestyramine.  She gets monthly B12 injections.   FAMILY HISTORY AND SOCIAL HISTORY AND REVIEW OF SYSTEMS:  Are noted.  Her  husband is 21 and is on dialysis.  She has a 74 year old son, 75 year old  daughter and a 58 year old son in good health.  She lives in [place].  There is a family history of high blood pressure and kidney disease, but not  prostate cancer, diabetes.  The rest of the 12 point review of systems is  negative except as noted.   PHYSICAL EXAMINATION:  Vital signs:  Blood pressure is 100.7, pulse of 128,  respirations 18, blood pressure is 110/60.  General:  She is a pale, thin  white female with shaking chills.  HEENT:  Clear.  Chest: Clear.  Abdomen:  Soft, but multiple scars.  Cysto was done in the office and the stents  removed.  Extremities: Negative.  No edema.   IMPRESSION:  1. Vesicocolonic fistula.  2. Bilateral ureteral obstruction with bilateral nephrostomies.  3. History of multiple procedures with enterocutaneous fistula  in the      past.  Admit for hydration, antibiotics and treatment and care until      this settles down.     Courtney Paris, M.D.  Electronically Signed    HMK/MEDQ  D:  05/25/2006  T:  05/26/2006  Job:  [id number]

## 2010-12-24 NOTE — Discharge Summary (Signed)
NAME:  [name], [name]                         ACCOUNT NO.:  [id number]   MEDICAL RECORD NO.:  [id number]                   PATIENT TYPE:  INP   LOCATION:  5033                                 FACILITY:  [facility]   PHYSICIAN:  Thornton Park. Daphine Deutscher, M.D.             DATE OF BIRTH:  [birth year]   DATE OF ADMISSION:  07/13/2002  DATE OF DISCHARGE:  07/27/2002                                 DISCHARGE SUMMARY   CHIEF COMPLAINT:  The patient is first postoperative day status post right  hemicolectomy, repair of colotomy, ileostomy and creation of nevus fistula  by Dr. Oda Kilts and Dr. Roselyn Meier in [place], [place].   HISTORY OF PRESENT ILLNESS:  She is a 77 year old lady who has had some  complications dating back to her earlier endometrial cancer and radiation  and recent colonoscopy.  The patient was taken in transfer by the [facility]  with a large sump drain in the left lower quadrant, a mucous fistula in the  left lower quadrant and an ileostomy in the right upper quadrant.   HOSPITAL COURSE:  The patient was admitted directly to the intensive care  unit.  She was followed closely by Dr. Lina Sar,  from a GI standpoint  and the critical care medicine service.  She was on the ventilator and they  did a nice job managing her.  She was extubated by July 15, 2002, was  drowsy but responsive and was maintained in the intensive care unit.  Concern was over her ostomy but it did continue to function and it remained  viable.  She became somewhat depressed but was given supportive measures and  managed to get nutritional support for her malnutrition and allowed her  wounds to heal secondarily.  She made slow but steady progress.  Her sump  drain was removed on about the fifth day here in the hospital.  She was  covered with antibiotics and started feeling better and was seen by the  mobility team and was gotten up and seemed to be getting a good mental  attitude, taking a diet and  was ready for discharge on July 27, 2002.  She was really taken care of during the bulk of the hospitalization by Dr.  Jamey Ripa, who was her surgeon from several years ago and by Dr. Lina Sar  who has been following her both from the GI standpoint.  At July 27, 2002, at the time of her discharge, her ostomy was pink and working, her  wounds were intact.  She was afebrile and was eager to go home.  Arrangements were made for follow-up with Dr. Jamey Ripa in the office on  August 02, 2002.   FINAL DIAGNOSIS:  Status post repair of enterotomies at another hospital  with postoperative intensive and supportive care provided here at [facility].  [facility].   CONDITION ON DISCHARGE:  Improved.  Thornton Park Daphine Deutscher, M.D.    MBM/MEDQ  D:  08/27/2002  T:  08/27/2002  Job:  [id number]

## 2010-12-24 NOTE — Op Note (Signed)
[name], [name]               ACCOUNT NO.:  [id number]   MEDICAL RECORD NO.:  [id number]          PATIENT TYPE:  INP   LOCATION:  5711                         FACILITY:  [facility]   PHYSICIAN:  Currie Paris, M.D.DATE OF BIRTH:  05-17-1928   DATE OF PROCEDURE:  07/17/2006  DATE OF DISCHARGE:                               OPERATIVE REPORT   PREOPERATIVE DIAGNOSIS:  Nonfunctioning colostomy (mucous fistula).   POSTOPERATIVE DIAGNOSIS:  Nonfunctioning colostomy (mucous fistula).   OPERATION:  Revision to end colostomy and long Hartmann.   SURGEON:  Dr. Jamey Ripa   ANESTHESIA:  General.   CLINICAL HISTORY:  [name] is a 77 year old lady that was taken to the  operating room on July 12, 2006, for a diverting colostomy.  Because  of her multiple prior abdominal procedures I elected to make a left  lower quadrant incision to bring out an end colostomy using the mid  descending colon.  At time of surgery we did a colonoscopy so that could  identify the route of the colon and mark the overlying skin.  When the  left lower quadrant incision was made, I identified a segment of colon  and exteriorized the superior portion which I presumed to be the distal  end.  This particular loop ran from the left upper quadrant down into  the pelvis.   Postoperatively, she had no output out of this and had a fair amount of  pain, developed what looked appeared to be a bowel obstruction type  pattern.  Because she had markedly distorted anatomy from prior  operative interventions I put some gastrogrffin up the end colostomy.  It appears from that, that her transverse colon is pulled it down into  her pelvis and runs just adjacent and apparently just anterior to her  descending colon and the end that we had brought out was actually distal  transverse colon.  Therefore she had a mucous fistula with the  functioning end closed off and left back inside.  This was discussed  with the patient and  the need for revision.  The patient agreed to  proceed.   The patient was seen in the holding area and she had no further  questions.  She was taken to the operating room and after satisfactory  general endotracheal anesthesia obtained, the abdomen was prepped and  draped and the time-out occurred.   I removed my skin sutures and fascial sutures.  I freed up what was the  mucous fistula and closed that off with the TA-60 stapler producing a  nice healthy row of staples in what appeared to be good tissue.   The other end from where we had stapled that before was identified with  a staple line being identified and this was manipulated up into the  fascia for the colostomy.  Once I had this well up I closed the fascia  again with #1 Prolene interrupted fascial sutures going through and  through all layers.  Skin was reapproximated with some 4-0 nylon.   The staple line was cut off and this end of the colostomy again matured  with 4-0 chromic.  The colostomy and tissue appeared to be viable and  under no tension.  I also tacked it to the deeper tissues with some 3-0  silk prior to maturing it.   The patient tolerated the procedure well.  No operative complications  and all counts were correct.      Currie Paris, M.D.  Electronically Signed     CJS/MEDQ  D:  07/17/2006  T:  07/17/2006  Job:  [id number]

## 2010-12-24 NOTE — Op Note (Signed)
[name], [name]               ACCOUNT NO.:  [id number]   MEDICAL RECORD NO.:  [id number]          PATIENT TYPE:  AMB   LOCATION:  [facility]                         FACILITY:  [facility]   PHYSICIAN:  Currie Paris, M.D.DATE OF BIRTH:  11/11/1927   DATE OF PROCEDURE:  DATE OF DISCHARGE:                                 OPERATIVE REPORT   PREOPERATIVE DIAGNOSIS:  Chronic rectocutaneous fistula.   POSTOPERATIVE DIAGNOSIS:  Chronic rectocutaneous fistula.   OPERATION:  Injection of fistula tract with Tisseel.   SURGEON:  Dr. Jamey Ripa.   ANESTHESIA:  None.   CLINICAL HISTORY:  [name] has a chronic rectocutaneous fistula from a  remote low anterior resection. She had prior radiation and was not a  candidate for operative intervention to close this fistula. It has been  chronic for about two years. After discussion with the patient, she elected  to have [place] attempt to close this by sealing it with Tisseel.   DESCRIPTION OF PROCEDURE:  In the radiologist suite, Dr. Deanne Coffer injected  the prior fistula tube and confirmed that we continued to have communication  into the rectum. That tube was changed over a guidewire with a peel-away  sheath. I then inserted the laparoscopic Tisseel delivery system, and we  carefully placed it just at the junction of the fistula and the colon. I  then injected Tisseel as the tubing was slowly backed out, filling the tract  with Tisseel. The patient tolerated this nicely with no significant pain or  problems. Sterile dressing was applied. She was instructed to stay on a  clear liquid diet. She has a follow-up appointment next week. She knows if  she develops any symptoms of fever or increasing pain, feculent drainage,  etc., she will need to call us.      Currie Paris, M.D.  Electronically Signed     CJS/MEDQ  D:  05/23/2005  T:  05/23/2005  Job:  [id number]

## 2010-12-24 NOTE — Discharge Summary (Signed)
NAME:  [name], [name]                         ACCOUNT NO.:  [id number]   MEDICAL RECORD NO.:  [id number]                   PATIENT TYPE:  INP   LOCATION:  0454                                 FACILITY:  [facility]   PHYSICIAN:  Currie Paris, M.D.           DATE OF BIRTH:  [birth year]   DATE OF ADMISSION:  12/19/2002  DATE OF DISCHARGE:                                 DISCHARGE SUMMARY   CCS#:  [id number]   FINAL DIAGNOSES:  1. Ileostomy and mucous fistula status post partial ileal and colon     resection following colon perforation.  2. Extensive intraabdominal adhesions.  3. Urinary tract infection.  4. Superficial wound infection.  5. Possible pneumonia.  6. Malnutrition.  7. Postoperative anemia.   CLINICAL HISTORY:  [name] is a 77 year old lady who about six months ago  had a colonoscopic perforation of her cecum. She was operated on elsewhere,  had a resection of the terminal ileum, ascending and transverse colon with  creation of an ileostomy and mucous fistula. The ileostomy became ischemic  and basically sluffed to the level of the fascia and was managed with a bard  tube. She had been on clear liquids and home TNA while waiting for some time  to pass before attempting reanastomosis or revision of the ileostomy to a  more permanent acceptable ileostomy.   At this point, the patient wished to proceed to surgery and was brought in  electively.   HOSPITAL COURSE:  The patient was admitted on May 75 and taken to the  operating room. Her ileostomy and mucous fistula were taken down and a  reanastomosis performed. She had extensive deep dense adhesions into the  pelvis, there were multiple loops of small bowel and was noted to have some  radiation fibrosis and we were fortunately able to get enough of the  terminal ileum freed up to do an anastomosis without delving into these  pelvic adhesions.   The patient actually tolerated the procedure fairly well. She had pretty  good pain control the day after surgery and appeared stable. We were getting  her out of bed. We continued her home TNA because of her malnutrition. By  the second postoperative day, she was having minimal incisional pain, no  nausea or chest pain but was running fevers to 101. She appeared stable, we  tried to increase her activity. On the third postoperative day, she felt  some gas moving around but felt like she had some lung congestion. She had  some decreased breath sounds at the bases and a chest x-ray showed some  atelectasis versus infiltrate. Her abdomen appeared to be still doing okay  and will try to increase her activity. She also had a urinalysis checked  which subsequently showed both a Klebsiella and enterococcus. She had been  placed on some Fortaz because of her pulmonary congestion and concern she  might developing hospital acquired pneumonia.  On the fourth postoperative  day, her abdomen seemed a little distended. Temperature seemed to be coming  down. She was having some liquid stool. The wicks from the abdominal wound  removed and we thought her fever was most likely pulmonary. She was put on a  clear liquid diet and plans for checking a CBC the next morning. It was  noticed that her hemoglobin was drifted down and this was thought a  combination of operative blood loss and volume replenishment secondary to IV  fluids and probably a little volume depletion at the time of her  preoperative labs being done. White counts remained normal through all of  these fevers. On the seventh postoperative day, she was taking some full  liquids. She still spiked to 102 and her lungs remained fairly clear. She  looked like she was developing some abdominal wall erythema. We opened the  midline wound and some what look like perhaps a little hematoma or blood  with some semipurulent material drainage. This was cultured and packed and  wound dressing changes begun. Subsequent cultures  showed a few enterococcus  and I wondered if some of the bacteria from her urine had managed to  contaminate her wound. Because of some increased abdominal pain, we got a CT  scan which did show some inflammatory changes consistent with wound  inflammation but no intraabdominal processes. Her temperature began to come  down after the wound was opened but again continued low grade. Since she was  tolerating a solid diet, her PICC line was removed. Because of cultures, I  also changed her antibiotic and because of more concern about her wound to  Primaxin. With these changes, she became afebrile. She began tolerating a  regular diet and was having frequent stools and on May 25, she had been  afebrile for a couple of days. Her abdomen looked much better. There was no  purulent drainage from the wound and this appeared to be healing nicely.  Repeat urine culture was negative. The cultures in the PICC line was  negative but again when that came out her fever seemed to go away.   The patient was discharged on May 25. She can eat a regular diet. She was  given a prescription for Vicodin for pain, Restoril for sleep and Augmentin  875 to take for five more days. She knows she may use some Imodium if her  stools are loose and that she should begin at some point to take some iron  replacement. She is to be followed up in my office in approximately one  week.                                               Currie Paris, M.D.    CJS/MEDQ  D:  12/31/2002  T:  12/31/2002  Job:  [id number]   cc:   Lina Sar, M.D. [facility]

## 2010-12-24 NOTE — Op Note (Signed)
[name], [name]               ACCOUNT NO.:  [id number]   MEDICAL RECORD NO.:  [id number]          PATIENT TYPE:  INP   LOCATION:  5711                         FACILITY:  [facility]   PHYSICIAN:  Currie Paris, M.D.DATE OF BIRTH:  [birth year]   DATE OF PROCEDURE:  07/12/2006  DATE OF DISCHARGE:                               OPERATIVE REPORT   PREOPERATIVE DIAGNOSIS:  Colovesical fistula.   POSTOPERATIVE DIAGNOSIS:  Colovesical fistula.   OPERATION:  Diverting left colostomy.   SURGEON:  Currie Paris, M.D.   ASSISTANT:  Rose Phi. Maple Hudson, M.D.   ANESTHESIA:  General endotracheal.   CLINICAL HISTORY:  [name] is a 77 year old lady who has developed a  colovesical fistula.  She has had a prior low anterior resection and had  previously had a chronic colocutaneous fistula which was finally closed  with Tisseel a year ago.  She has had a ureteral strictures and on a  recent change out was noted to have a colovesical fistula that has  recently developed.  She has since had bilateral percutaneous  nephrostomies.  Because of ongoing pain and leakage of feculent material  into the bladder causing the pain, it was elected to do a diverting  colostomy which we thought would probably be a permanent one as I told  the patient that I did not know that we could ever get her fistulae to  heal given that she had had pelvic radiation.   DESCRIPTION OF PROCEDURE:  The patient was seen in the holding area,  again, and was prepared to proceed.  The patient was taken to the  operating room.  After satisfactory general anesthesia had been  obtained, Dr. Leone Payor came and did a short colonoscopy so we could try  to identify the location of the colon in the left mid abdomen since she  has had multiple abdominal procedures.  I was able to mark the overlying  skin  using the light from the colonoscope as a guide.  The abdomen was  then prepped and draped.  The time out occurred.   Because of  her multiple abdominal operations and extensive adhesions, I  elected to go, instead of in the midline, to make a transverse incision  over the area that we had identified with the colonoscope as the area of  the colon.  I divided the fascia and entered the peritoneal cavity.  There was a loop of bowel right here which I was able to gently dissect  off.  I worked medially until I got almost to the midline, some  superiorly, some inferiorly.  Laterally, I was able to find another loop  of bowel which was able to be identified as small bowel.  The initially  identified loop appeared to be colon.  I freed it up distally so I could  see it going down towards the pelvis.  It was a little bit more anterior  secondary to her prior surgery and I think it had been translocated  somewhat during those procedures.   Once I had the anatomy clarified and several cm in the direction  of the  colon freed up, I went ahead and divided it with the GIA.  The distal  limb was then dropped back into the abdominal cavity.  The proximal limb  was brought out for the colostomy.  I then closed the fascia with  interrupted 0 Novofil sutures to make an opening just big enough for the  ostomy without cramping its blood supply.  I closed the skin on either  side so we could get good attachment of the bag and I used sutures of 4-  0 nylon for that.  Once this was accomplished, I cut off the end of the  staple line and matured the colostomy using multiple sutures of 3-0  chromic.  This completed the procedure.  A colostomy bag was applied.  The patient tolerated the procedure well.  There were no operative  complications.  Counts were correct.      Currie Paris, M.D.  Electronically Signed     CJS/MEDQ  D:  07/12/2006  T:  07/12/2006  Job:  [id number]   cc:   Georgina Quint. Plotnikov, MD  Hedwig Morton. Juanda Chance, MD  Iva Boop, MD,FACG  Courtney Paris, M.D.

## 2010-12-24 NOTE — Discharge Summary (Signed)
NAME:  [name], [name]                         ACCOUNT NO.:  [id number]   MEDICAL RECORD NO.:  [id number]                   PATIENT TYPE:  INP   LOCATION:  0466                                 FACILITY:  [facility]   PHYSICIAN:  Currie Paris, M.D.           DATE OF BIRTH:  1928-03-01   DATE OF ADMISSION:  09/17/2002  DATE OF DISCHARGE:  09/21/2002                                 DISCHARGE SUMMARY   FINAL DIAGNOSES:  1. Stenotic ileostomy with subsequent partial bowel obstruction.  2. Hypokalemia secondary to #1.  3. Status post colon perforation with subsequent right hemicolectomy,     ileostomy.  4. Malnutrition.  5. History of radiation induced enteritis.  6. Osteoporosis.  7. Depression.   HISTORY OF PRESENT ILLNESS:  The patient is a 77 year old lady who underwent  partial small-bowel resection at the time of colonic perforation.  She had  an ileostomy which was ischemic and initially functioned but had gradually  gotten stenotic.  She had been dilated in our office a couple of times and  she had been maintained on some home hyperalimentation trying to allow some  time to go by so that we could, at some point go back in and try to redo  this with a formal revision or formal takedown and reanastomosis.  At this  point, she had to be admitted because the area had gotten so stenotic, she  was getting almost nothing out, was having nausea and vomiting.   HOSPITAL COURSE:  The patient was admitted on September 17, 2002.  She had  already been on home TNA.  This was continued.  X-rays were consistent with  a partial small-bowel obstruction.  She was taken to the operating room on  September 18, 2002, and ileostomy stricture was noted and we resected part of  the skin at the ileostomy and got down to the level of the fascia where we  could get an adequate opening into the small-bowel.  I placed an ileostomy  tube in so that this could stay propped open.  I did not feel at this  point  that it was appropriate to try to go back into the abdomen to do any more  extensive resection.   The morning after surgery, she felt much better and in fact noted that her  preoperative nausea had disappeared in the recovery room.  The tube was  draining well and she remained stable.   We got an x-ray through the tube to make sure there was no other blockages  and everything looked okay that way.  She was continued on her TNA, although  had a slight low grade fever.  By September 20, 2002, she was feeling much  better, tolerating liquids.  Her tube was being irrigated and wound care was  followed up with a wound care team.  Home health care was aware of her needs  at home since they  had been taking care of her previously and by September 21, 2002, she felt able to go home with TNA and nursing visits at home.  Her  wound was clean and the ostomy tube was working fine.   DISCHARGE MEDICATIONS:  The patient was discharged on Tylenol, Vicodin and  Cipro.   LABORATORY DATA:  The laboratory studies this admission included initial  hemoglobin 11.7 which with hydration, etc., drifted to 9.3.  White count was  within normal range except for slightly below normal at 3.7 just prior to  discharge.  Electrolytes initially showed hypokalemia at 3.0, sodium 133 but  by the time of discharge, had  corrected to 136 and 4.1.  Prealbumins were low during admission of 17 down  to 11.  Urine culture did show greater than 100,000 colonies of Klebsiella  and enterococcus.  We initially tried to treat this with some Cipro, she was  discharged with.                                               Currie Paris, M.D.    CJS/MEDQ  D:  09/30/2002  T:  09/30/2002  Job:  [id number]   cc:   Lina Sar, M.D. [facility]

## 2010-12-24 NOTE — Assessment & Plan Note (Signed)
 HEALTHCARE                           GASTROENTEROLOGY OFFICE NOTE   SHAYLEN, NEPHEW                      MRN:          [id number]  DATE:07/03/2006                            DOB:          09/03/1927    Ms. Stanbery is a 77 year old white female with a history of colon cancer,  endometrial cancer with radiation enteritis, cecal perforation during  colonoscopy, diverting colostomy and takedown of the colostomy.  She now has  developed a colovesicular fistula and underwent a urinary diversion by Dr.  Aldean Ast with nephrostomy tubes bilaterally about 6 to 8 weeks ago.  She is  being prepared for a diverting colostomy again by Dr. Jamey Ripa in the next 2  weeks.  She denies any fever, but has abdominal pain, for which she takes  oxycodone.  The pain is related to filling of her bladder with retained  fecal material from the colon.   PHYSICAL EXAM:  Blood pressure 104/54, weight 105.4 pounds, pulse was 64.  The patient is alert and oriented, chronically ill appearing, thin.  Her  weight was stable compared to last exam in August of this year.  LUNGS:  Clear to auscultation.  COR:  Normal s1, normal s2.  ABDOMEN:  Cachectic with multiple surgical scars and tenderness in the right  lower quadrant.  Increased tympany in the left middle and upper quadrants.  RECTAL:  The rectal tone was normal.  Normal posterior rectal wall.  On the  anterior rectal wall, irregular mucosa, soft tissue, which was irregular  with frank blood on my glove.  Stool was soft, Hemoccult positive.   IMPRESSION:  1. A 77 year old white female with colovesicular fistula due to radiation      breakdown colitis status post nephrostomy tubes.  The patient is      awaiting redo of the diverting colostomy by Dr. Jamey Ripa in the next 2      weeks.  2. Severe anemia due to continued blood loss from the inflammation in the      rectal ampulla and the bladder.   PLAN:  1. Continue iron  supplements.  2. The patient to call us if she has any fevers or chills so that we can      restart her Cipro.  3. At this time, continue all the other medications, which would include      Questran 4 g daily, oxycodone, also Restoril 30 mg a day, refill given      to the patient.     Hedwig Morton. Juanda Chance, MD  Electronically Signed    DMB/MedQ  DD: 07/03/2006  DT: 07/03/2006  Job #: [id number]   cc:   Courtney Paris, M.D.  Currie Paris, M.D.

## 2010-12-24 NOTE — Assessment & Plan Note (Signed)
[facility]                           PRIMARY CARE OFFICE NOTE   [name], [name]                      MRN:          [id number]  DATE:08/24/2006                            DOB:          Dec 07, 1927    HISTORY OF PRESENT ILLNESS:  The patient is a 77 year old female with  multiple medical problems who was worked in my schedule today after her  postop visit with Dr. Jamey Ripa.  She has been having increasing problems  with failure to thrive and depression, being upset all the time.  She is  complaining of tired of being sick.  Complains of pain over the left  lower quadrant which she rates as 8/10.   Current medications: iron pills, Vicodin and Percocet that did not help.  New prescription for Ultram has been prescribed today.   SOCIAL HISTORY:  Her husband is sick at [facility].  Her son  lives next door.  Friends brought her in today.   REVIEW OF SYSTEMS:  Lost weight from 103 down to 88.   PHYSICAL EXAMINATION:  VITAL SIGNS:  Blood pressure 92/65, pulse 93,  temperature 97.2, weight 88 pounds.  She looks cachectic.  She is  tearful, upset.  HEENT:  Moist mucosa, pale.  NECK:  Supple.  LUNGS:  Clear.  HEART:  Slightly tachycardia.  ABDOMEN:  Colostomy on the left with loose stool in the bag.  Tender.  BACK:  Two urostomies.  EXTREMITIES:  Without edema.  NEUROLOGICAL:  She is alert, oriented, cooperative.  She is depressed.  Not suicidal.   ASSESSMENT/PLAN:  1. Chronic pain, mostly abdominal.  I gave her a prescription for      Duragesic 25 mcg #10 to change every three days.  She can try      Ultram or Vicodin for pain breakthrough.  Follow up with me in      three weeks.  2. Severe depression, situational.  Remeron 15 mg q.h.s.  Hopefully,      this will help with anxiety, lack of appetite and insomnia.  3. Failure to thrive.  [facility] is supposed to call her      today and start home visits.  4. Insomnia.  She has  Temazepam.  She can use it with her Remeron as      needed.  5. Status post colostomy, status post urostomy.    Georgina Quint. Plotnikov, MD  Electronically Signed   AVP/MedQ  DD: 08/24/2006  DT: 08/24/2006  Job #: [id number]   cc:   Currie Paris, M.D.

## 2010-12-24 NOTE — Discharge Summary (Signed)
   NAME:  [name], [name]                         ACCOUNT NO.:  [id number]   MEDICAL RECORD NO.:  [id number]                   PATIENT TYPE:  INP   LOCATION:  0354                                 FACILITY:  [facility]   PHYSICIAN:  Sean A. Everardo All, M.D. [facility]           DATE OF BIRTH:  1928/04/15   DATE OF ADMISSION:  05/08/2003  DATE OF DISCHARGE:  05/22/2003                                 DISCHARGE SUMMARY   REASON FOR ADMISSION:  Enterocutaneous fistula.   HISTORY OF PRESENT ILLNESS:  The patient is a 77 year old woman admitted by  Dr. Jonny Ruiz on May 08, 2003, with an enterocutaneous fistula.  Please  refer to his admission history and physical for details.   HOSPITAL COURSE:  The patient was admitted and placed on bowel rest.  She  was prescribed antibiotics and she was found on CT to have bilateral  hydronephrosis.  The fistula and hydronephrosis were both presumed to be due  to her history of external beam radiation therapy.  Urology placed bilateral  ureteral stents successfully.  Radiology was consulted and they dilated the  drainage tract with a catheter to the ostomy bag.  Because this appeared to  be functioning well, surgery decided to defer the colostomy for now.  Plan  was to leave the drainage catheter in place for a few weeks, pending  surgical followup.   By May 22, 2003, she was alert, oriented, ambulatory, and eating some  amount of a diet.  She was thus discharged home in fair condition.   DISCHARGE DIAGNOSES:  1. Enterocutaneous fistula probably due to a history of external beam     radiation therapy.  2. Bilateral hydronephrosis, also presumed due to her history of external     beam radiation therapy.  3. Mild anemia, followup needed if indicated.  4. Other chronic medical problems as noted in history and physical.   DISCHARGE MEDICATIONS:  1. Restoril 30 mg q.h.s. as needed for sleep.  2. Zoloft 50 mg daily.  3. Miacalcin 200 units intranasal daily.  4.  Urisidone two tablets four times daily as needed for bladder pain.   DIET:  Continue full liquid.   FOLLOWUP:  Dr. Aldean Ast in one month and also Dr. Jamey Ripa within one week.  Also Dr. Posey Rea in two weeks as previously scheduled.                                                Sean A. Everardo All, M.D. [facility]    SAE/MEDQ  D:  05/22/2003  T:  05/22/2003  Job:  [id number]

## 2010-12-24 NOTE — Assessment & Plan Note (Signed)
[facility]                           GASTROENTEROLOGY OFFICE NOTE   GUSTA, MARKSBERRY                      MRN:          [id number]  DATE:04/04/2006                            DOB:          1928-04-25    [name] is a 77 year old white female I have known since 1989 with a colon  cancer, recurrent, status post multiple colonoscopies and radiation to the  rectum, causing radiation colitis.  During one of the colonoscopies, the  patient suffered perforation and had to have an addition resection of the  colon, resulting in a colostomy.  The colostomy was finally taken down by  Dr. Jamey Ripa in October of 2006.  The patient has been having normal bowel  movements, somewhat soft, somewhat diarrheal.  She, over the years, has had  heme positive stool because of radiation enteritis and colitis.  She also  has a history of B-12 deficiency.  She was on TPN for a long time because of  her colon to the skin, but she has been able to have internal nutrition now  for several years, and, although she remains rather slim, she is able to  maintain her nutritional status.  She is here today because of iron-  deficiency anemia.  Hemoglobin dropped down to 8.9 gm with a low MCV of 73  and iron saturation of 2%.  Dr. Jonny Ruiz put her on over-the-counter iron twice  a day.  The hemoglobin came up to 9.5.  This was about a month ago.  She is  here today to further evaluate her iron-deficiency anemia.  The patient  denies any visible blood in stools.  She has no nausea or vomiting.  She is  eating 3 meals a day, but has not really gained any weight.  She is stable  around 101 to 105 pounds.  She denies abdominal pain.   MEDICATIONS:  1. Caltrate with vitamin D.  2. Cholestyramine 4 gm a day.  3. B-12 injections.  4. Iron supplements twice a day.  5. Ibuprofen p.r.n.   PHYSICAL EXAMINATION:  VITAL SIGNS:  Blood pressure of 104/54, pulse 60,  weight 103 pounds.  GENERAL:   She was rather slim, alert, oriented, in no distress.  HEENT:  Sclerae were non-icteric.  Oral cavity was normal.  No cheilosis.  Tongue was papulated.  LUNGS:  Clear to auscultation.  COR:  Normal S1 and S2.  ABDOMEN:  Soft with radiation changes of the skin.  Scaphoid with multiple  scars.  A well-healed post colostomy scar in the left middle quadrant.  Bowel sounds were normoactive, somewhat hypoactive.  There was no  tenderness.  No palpable mass.  RECTAL:  Normal to slightly decreased rectal tone with dark strongly  Hemoccult positive stool.   IMPRESSION:  As 77 year old white female with low-grade gastrointestinal  blood loss which could be multifactorial.  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  She  has a history of radiation proctitis, which in itself would cause GI blood  loss.  The colonic  anastomosis which was created by Dr. Jamey Ripa last October  also could be the source of low-grade blood loss because of the healing and  the granulation tissue at the anastomosis.  Another cause for iron  deficiency would be decreased absorption secondary to her using Questran.  Although she does not take much ibuprofen, that could be also causing some  gastritis or low-grade GI blood loss.  Recurrent carcinoma would be a  possibility, but her CT scan of the abdomen done several months ago did not  show any recurrent, and her primary carcinoma was a long time ago, and is  not likely to be recurrent at this time.   I have discussed further work-up with the patient, and I offered upper  endoscopy with enteroscopy, as well as small bowel capsule endoscopy or  flexible sigmoidoscopy.  She is quite busy taking her husband to dialysis 3  times a week, and she is really not interested in aggressive work-up.  She  would prefer to try taking her iron orally, and possibly obtain an iron  infusion if necessary, and, if the bleeding  continues without response to  iron supplements, we will proceed with small bowel capsule endoscopy, upper  endoscopy, as well as possible colonoscopy.   PLAN:  1. Continue iron b.i.d.  I also have given her samples of Tandem Plus to      take daily.  2. CBC iron studies today.  If the hemoglobin is improved compared to      July, she may not need iron infusion, but if the hemoglobin continues      to be below 10, we will set her up for iron infusion.  3. CEA level today.  4. Reassess in the next 2 months as to the need for further endoscopic      evaluation.                                   Hedwig Morton. Juanda Chance, MD   DMB/MedQ  DD:  04/04/2006  DT:  04/04/2006  Job #:  [id number]   cc:   Corwin Levins, MD

## 2010-12-24 NOTE — Discharge Summary (Signed)
NAME:  [name], [name]                         ACCOUNT NO.:  [id number]   MEDICAL RECORD NO.:  [id number]                   PATIENT TYPE:  INP   LOCATION:  5712                                 FACILITY:  [facility]   PHYSICIAN:  Currie Paris, M.D.           DATE OF BIRTH:  1927/11/01   DATE OF ADMISSION:  07/31/2002  DATE OF DISCHARGE:  08/19/2002                                 DISCHARGE SUMMARY   FINAL DIAGNOSES:  1. Volume depletion secondary to high ileostomy output.  2. Hypotension secondary to #1.  3. Malnutrition.  4. Partial bowel obstruction secondary to stenosis at ileostomy.  5. Status post emergency partial colectomy with ileostomy for colonoscopic     perforation of colon.  6. Hyponatremia.   HISTORY OF PRESENT ILLNESS:  The patient is a 77 year old who was discharged  recently from [facility].  She had an emergency partial colectomy in  [place] following a cecal perforation at colonoscopy.  She was transferred  here for postoperative care, was on a ventilator, and was septic for a few  days, but improved, and was able to be discharged with her ileostomy  functioning nicely.  There was always a question of some viability of the  ileostomy, but it was elected to attempt to get by without trying to revise  this until her overall status was improved.   The patient was admitted on 07/31/02, with increasing nausea and vomiting,  and she had a large amount of ileostomy output temporarily, and had been  unable to maintain oral hydration.  Details are noted in the admission note.   HOSPITAL COURSE:  The patient was admitted, begun on some IV fluids, and  began feeling much better.  Electrolytes improved, as did creatinine.  She  was complaining some of some gas pains on the second hospital day, and  became nauseated.  However, she did continue to have some ileostomy output.  We tried her on some Reglan, but because of inadequate oral intake,  maintained her on IV  fluids.  By 08/05/02, she seemed to be getting a little  bit better, and we thought perhaps she was having ileostomy dysfunction and  tried to increase her oral intake.  However, this was unsuccessful, and she  began running a low-grade fever and we elected to put a PICC line in and  begin on her TNA on 08/07/02.  On 08/08/02, she was started to feel better,  had good output from her ostomy, was increased a little bit on diet oral  materials.  On 08/09/02, she started spiking to 102, urines were sent for  cultures, but were negative.  We also obtained a lower extremity venous  Doppler, but this was negative.  By 08/10/02, she had normal ileostomy output,  her abdomen was soft and nontender, hemoglobin was 10.3, white blood cell  count 6100, and she seemed to be doing better.  Fever workup had been  negative thus far, and it was elected to discontinue her PICC and follow her  temperature.  The next couple of days she did well, and then developed  nausea again on 08/12/02, and had limited to no intake orally, and very little  out of her ileostomy, and we restarted her IV fluids to avoid her getting  volume depleted.  She then developed some feculent emesis on 08/12/02, and a  KUB and upright showed some dilated loops, similar to what had been seen on  some films earlier.  Her ileostomy site was noted to be fairly tight, and we  thought she may have a stricture causing a partial obstruction.  GI  consultation with Dr. Juanda Chance was also obtained.  That day, she was having a  fair amount of pain, and we went ahead and checked her ileostomy site and  saw no evidence of a stricture of the proximal bowel.  However, we did  dilate the ostomy a little bit, and she ended up having stool and liquid  output through the ileostomy on 08/13/02.  There was no evidence of small  bowel obstruction on her small bowel series, but the process was thought was  related to a stricture, and discussions were held on numerous  occasions with  the patient about trying to revise this externally, but the patient  continued to be resistant to those ideas.  We replaced her PICC line, tried  to give her some bowel rest, and by 08/14/02, was doing better with no nausea,  no pain, and we tried her again on liquids, and she seemed to tolerate these  okay.  These were maintained for a couple of days, and it appeared by late  that her small bowel obstruction was resolving, although she remained  somewhat malnourished.  We tried to give her some shake up milkshakes, and  when we tried to advance her diet a little bit more solid, then we found  again that she had developed some symptoms of obstruction.  We talked about  trying to send her home on home TNA, but she really did not want to do that,  and we felt that she was getting enough orally to try to manage.  On  08/19/02, she really did want to go home, and was tolerating some liquids,  her ostomy seemed to be working.  We removed her PICC again because she had  a low-grade fever, and we were concerned that might be the source.  It was  recognized at the time of discharge that at some point we may have to bring  her back to revise her ileostomy.  It was hoped that we would be able to do  this without the necessity of going back intraperitoneally because we felt  that would be extremely difficult and hazardous with high risk for other  injury to bowel.   LABORATORY DATA:  Initial hemoglobin of 10.1, which drifted down to 7.6 with  rehydration.  She was transfused again up to 12.8, but eventually settled  down around 10.6.  White blood cell count was essentially normal throughout  the entire stay with one slight elevation on 08/06/02 of 11.6.   Electrolytes on admission did show some hyponatremia.  Creatinine was up to  4, again volume depletion, but this came rapidly down with rehydration, and was close to 0.6 near discharge.  She maintained a slightly slow sodium, but   this was not felt to be a clinical problem.  Her cholesterol was  noted to be  70, triglycerides 104.  Initial prealbumin that we checked was 9.  Just  prior to discharge, it was still low at 11.9.  Total iron was 18, total iron  binding capacity of 214, B12 was elevated at 999, ferratin was elevated at  678.  Urine cultures were negative.                                               Currie Paris, M.D.    CJS/MEDQ  D:  09/13/2002  T:  09/13/2002  Job:  [id number]

## 2010-12-24 NOTE — H&P (Signed)
NAMELUISE, [name]               ACCOUNT NO.:  [id number]   MEDICAL RECORD NO.:  [id number]          PATIENT TYPE:  INP   LOCATION:  6713                         FACILITY:  [facility]   PHYSICIAN:  Vernice Jefferson, MD          DATE OF BIRTH:  [birth year]   DATE OF ADMISSION:  10/30/2006  DATE OF DISCHARGE:                              HISTORY & PHYSICAL   CHIEF COMPLAINT:  Abdominal pain, nausea and vomiting.   PRIMARY CARE PHYSICIAN:  Cyndia Bent, M.D.   HISTORY OF PRESENT ILLNESS:  The patient is a 77 year old white female  with a history of cervical cancer, status post hysterectomy and colon  cancer and several fistula formation and previous abdominal surgery, who  comes in with initially 3 days ago, began as nausea and vomiting that  was bilious in nature with increased abdominal pain and distention.  Today, the patient states her colostomy all of a sudden had an increased  amount of output.  Her abdominal pain has resolved; however, she still  has been able to only take a little p.o.  The patient denied any fevers  or chills.  No blood noticed in her vomit or her stool.  There has been  no change in her urine output from her nephrostomy tubes.  No chest  pain, no dyspnea.  One week ago, the patient does report that she took  an antibiotic which she stopped taking because it made her feel  nauseous.   PAST MEDICAL HISTORY:  1. History of colon cancer and colonic vesical fistula.  2. Cervical cancer.  3. Status post nephrostomy tubes.  4. 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  5. Pelvic radiation fibrosis.  6. Emphysema.  7. History of iron deficiency anemia.  8. Cachexia.   SOCIAL HISTORY:  No smoking, no alcohol, and no drug use.   FAMILY HISTORY:  Mother with hypertension.  Father with myocardial  infarction, but no early coronary disease.   MEDICATIONS:  1. Ditropan.  2. Vitamin D.  3. Iron sulfate.  4. Restoril.   ALLERGIES:  No known drug allergies.   REVIEW OF SYSTEMS:  Negative 11-point review systems except for  otherwise, as dictated by me in HPI.   PHYSICAL EXAMINATION:  GENERAL:  The blood pressure is 81/54 to 70/40.  Did improve with fluids.  T-max of 98.7, heart rate of 105.  GENERAL:  Cachectic white female in no acute distress.  HEENT:  Dry mucous membranes.  No sclerae.  Normal conjunctival pallor.  NECK:  Supple.  Full range of motion.  No jugular venous distention.  CARDIOVASCULAR:  Tachycardic.  Regular rate and rhythm.  No rubs,  murmurs or gallops.  LUNGS:  Clear to auscultation bilaterally.  No wheezes, rales or  rhonchi.  ABDOMEN:  Soft, nontender, nondistended with approximately 500 mL of  liquid green output from her ostomy.  EXTREMITIES:  No clubbing, cyanosis or edema.  Peripheral pulses are 2+.  NEURO EXAM:  Nonfocal.   IMAGING:  EKG not obtained.  Chest x-ray:  No acute process.   LABORATORY DATA:  White count  7.4, hemoglobin 11.2, platelets 174,000,  sodium 131, potassium 5.0, creatinine of 2.1, BUN of 62; up from her  baseline of 1.  Liver function tests and pancreatic enzymes are all  negative.   IMPRESSION:  1. Nausea and vomiting with abdominal distention, likely secondary to      ileus which has apparently resolved.  2. Acute renal failure.  3. Dehydration.  4. Hyponatremia.  5. History of colon cancer.  6. History of enterocutaneous fistulas.  7. History of nephrostomy tubes.   PLAN:  1. Will admit the patient to Dr. Tenna Child service with aggressive      fluid hydration and close clinical following of her labs.  With her      hyperkalemia, hyponatremia, and hypotension, we will check an      morning Cortisol just to rule out adrenal insufficiency.  2. No antibiotics for now.  I do not think her hypotension is due to a      low SVR state, but rather a volume depletion state.  Will send      stool for C. diff given her antibiotic history, also check UA. Will      also give her symptomatic  nausea control.  No PPI for now, given      possible C. diff and will also hold on DVT prophylaxis for now.      Vernice Jefferson, MD  Electronically Signed     JT/MEDQ  D:  10/31/2006  T:  10/31/2006  Job:  [id number]

## 2010-12-24 NOTE — Op Note (Signed)
NAMEMIASIA, CRABTREE               ACCOUNT NO.:  [id number]   MEDICAL RECORD NO.:  [id number]          PATIENT TYPE:  AMB   LOCATION:  [facility]                         [facility]ACILITY[facility]   PHYSICIAN:  Rozanna Boer., M.D.DATE OF BIRTH:  [birth year]   DATE OF PROCEDURE:  03/02/2005  DATE OF DISCHARGE:                                 OPERATIVE REPORT   PREOPERATIVE DIAGNOSIS:  Bilateral ureteral obstruction.   POSTOPERATIVE DIAGNOSES:  1.  Bilateral ureteral obstruction.  2.  Right ureteral calculi.   PROCEDURES:  Cystoscopy, remove and exchange bilateral ureteral stents,  right retrograde pyelogram, removal of right ureteral stone.   ANESTHESIA:  General.   SURGEON:  Courtney Paris, M.D.   BRIEF HISTORY:  This 77 year old lady enters with bilateral hydronephrosis  for stent change.  She has had endometrial cancer post hysterectomy and  radiation in the 1970s, in 1989 had colon cancer with hemicolectomy with  fecal perforation after colonoscopy that required multiple abdominal  operations.  She now has an enterocutaneous fistula/phlegmon from that, and  stents were first passed in October 2004 when she presented with bilateral  ureteral obstruction.  She has two areas of obstruction on the right, one at  the UPJ and one at the pelvic brim and on the left, also the pelvic brim,  where the phlegmon is present.  The last stent was changed Dec 07, 2004.  She  gets low back pain the last week before the exchange, was unable to tolerate  Urised-K due to her frequent bowel movements and has been drinking lemonade  to try to help prevent encrustations of the stents, which has been present  the last few times.   The patient was placed on the operating table in dorsal lithotomy position,  after satisfactory induction of general anesthesia was prepped and draped  with Betadine in the usual sterile fashion.  She was given IV antibiotics  and positioned over the fluoroscope so that  the stents could be seen.  The  bladder was entered and both stents were seen emerging from their respective  orifices.  They were both encrusted with stones in the bladder end.  The  right side was then grasped with forceps and pulled outside the urethral  meatus, but I was unable to pass a guidewire through this, as it would only  go about a third of the way before it met obstruction.  I had to then remove  the stent, and it was a little tight coming through the ureterovesical  junction.  When I passed the cystoscope again, there was a stone present,  probably from the stent, at the urethral meatus that I had to pull out using  grasping forceps.  After that I could then pass a guidewire up about 10 cm  up the ureter, where it met obstruction.  I then over this passed an open-  ended 5 ureteral catheter and removed the guidewire.   A retrograde was then performed showing an area of narrowing at the junction  of the middle and lower third of the right ureter with some filling  defects,  most likely stones, present.  There was another sharp curve at the UV  junction, where it took a sharp angle about 90 degrees as it then went back  into the renal pelvis.  There were some filling defects in the renal pelvis,  either from air bubbles or from possible stone fragments as well.  There was  some mild hydronephrosis noted as well.   Through the open-ended catheter, I pass a Glidewire and was able to  negotiate it past the first area of obstruction fairly easily. passed the  open-ended catheter up to the UPJ, where I had to manipulate the guide the  Glidewire quite a bit to be able to get it to go into the renal pelvis,  after which I then slid the open-ended catheter up into the renal pelvis,  got a nice hydronephrotic drip as I removed the Glidewire.  Through the open-  ended catheter I then passed a guidewire into the renal pelvis under  fluoroscopy and removed the open-ended catheter.  Over  the guidewire a new 7-  French 24 cm length double-J ureteral stent was passed under fluoroscopy,  leaving the coil in the renal pelvis in a nice fashion.   Next the left orifice was then grasped and pulled outside the urethral  meatus.  There also were some stones, but was able to get a guidewire up  through this up into the level of the kidney and then removed the guidewire.  Over the cystoscope I then passed a new 7-French x 24 cm length double-J  ureteral stent up to the level of the renal pelvis, where we removed the  guidewire, leaving a nice coil in the renal pelvis and one in the bladder.  I adjusted both stents slightly, flushed out the bladder, and then drained  it.  A B&O suppository was inserted.  She is given some Toradol and then  taken to the recovery room in good condition.   The plan is to try to her on some Polycitra to see if we can keep the  encrustations from occurring, or we will have to change the stents more  often than every three months.  The next stent change will be done in 2-1/2  months.       HMK/MEDQ  D:  03/02/2005  T:  03/02/2005  Job:  [id number]

## 2010-12-24 NOTE — Op Note (Signed)
NAME:  [name], [name]                         ACCOUNT NO.:  [id number]   MEDICAL RECORD NO.:  [id number]                   PATIENT TYPE:  INP   LOCATION:  0454                                 FACILITY:  [facility]   PHYSICIAN:  Currie Paris, M.D.           DATE OF BIRTH:  1928-03-24   DATE OF PROCEDURE:  12/19/2002  DATE OF DISCHARGE:                                 OPERATIVE REPORT   Office ZO#[id number].   PREOPERATIVE DIAGNOSIS:  Tube ileostomy and mucous fistula, status post  partial colon and small bowel resection.   POSTOPERATIVE DIAGNOSIS:  Tube ileostomy and mucous fistula, status post  partial colon and small bowel resection.   OPERATION/PROCEDURE:  Take-down of ileostomy and mucous fistula with partial  small bowel and colon resections and a primary ileocolic anastomosis.   SURGEON:  Currie Paris, M.D.   ASSISTANT:  Angelia Mould. Derrell Lolling, M.D.   ANESTHESIA:  General endotracheal anesthesia.   CLINICAL HISTORY:  Mrs. Burel is a 77 year old lady who back in December  underwent colonoscopy and had a perforation of her cecum.  She was operated  on the next day and cecal perforation was found and resection of her colon  and portion of the small bowel was done.  Because of multiple technical  issues, an ileostomy was performed as well as a mucous fistula on the left  side of the abdomen.  Postoperatively she was transferred to [facility]  and we cared for her and her ileostomy basically became ischemic and  sloughed such that it strictured.  We brought her back to the operating  room, opened the stricture up and placed a Malecot catheter in to drain the  ileostomy, trying to temporize several months before going back to try to  reanastomose her.  She had been on home TNA during this entire interval and  basically doing fairly well.  We had gotten a colonoscopy to make sure the  prior colon anastomosis was intact and there were no other issues with her  colon and  this looked okay.  After a lengthy discussion with the patient and  her husband, the indications for surgery, complications including wound  breakdown, inability to close the ostomy and having to just redo a new  ileostomy, possibly not being able to get into the abdomen at all, possible  injury to other organs, bowel, fistula, etc., not to mention all the usual  infectious bleeding, pulmonary and cardiac complications that could ensue,  the patient very strongly wished to proceed to surgery.   DESCRIPTION OF PROCEDURE:  The patient was seen in the holding area and had  no further questions.  She had been continued on her TNA up until last  night.  She was taken to the operating room and after satisfactory general  endotracheal anesthesia had been obtained, Foley catheter was placed.  I  took a pursestring around the ileostomy after removing the old  Malecot  catheter to keep from spillage.  The abdomen was prepped and then draped  with an [place].  I made an initial midline incision going above the old scar  so we could hopefully get into the abdomen and the epigastrium.  Then  extended the incision down about two-thirds of the way of the old incision,  divided the subcutaneous tissues until I got down to scar in the lower part  and then at the very upper end of the incision, I was able to identify  fascia, opened it and identified some intraperitoneal fatty tissue.  I got a  small plane established and was able to dissect what looked like some  omentum down off the anterior abdominal wall.  This was very tedious but we  were gradually able to open the incision further and further anteriorly  until we got down to the lower part of the incision where there appeared to  be some small bowel stuck to the anterior abdominal wall.  At this point I  quit opening the incision to see if I could not get better exposure of the  abdominal cavity.  The initial exposure looked easier to the left side, so  I  took some more omental adhesions down.  I was able to bluntly and sharply  dissect out some omentum and bowel off the left side of the anterior  abdominal wall and actually get around the colon as it came up to the mucous  fistula, get this freed up and see that we would have reasonable mobility on  this side.  Attention was then turned to the right side of the abdomen, going from the  midline.  Again I was able to bluntly dissect some small bowel off the  anterior abdominal wall and gradually work my way over towards the  ileostomy.  Some of these were very, very dense adhesions and had to be  taken down sharply but at no point did we enter the bowel.  As I worked  over, I was then able to bluntly get lateral to the ileostomy site and  bluntly free some small bowel up there.  I then worked from the incision  going below the ileostomy, as I had initially been above it, and I was able  to finally free its plane up there, getting several loops, which seemed to  be matted together, gently dissected off the anterior abdominal wall so I  could get around the ileostomy.  Once I had established that, I moved  anteriorly and made a circumcision around the ileostomy site, freed it up  off the subcutaneous tissues, got into the abdominal cavity there, was then  able to pull the ileostomy site back into the abdomen.  With this as a  handle, I was then able to better delineate the loops of bowel and I was  able to free about seven or eight inches of small bowel off the other loops,  going from the ileostomy somewhat proximally.  However, any more work in  this area was going to be fraught with hazard because of markedly dense  adhesions of loops of bowel and no real clearcut plane of dissection.  However, I thought I had enough ileum to work with freed up already.  I turned my attention back to the mucous fistula site, freed it up a little bit more so I had it freed up off the anterior abdominal  wall.  I then made  a skin incision to ellipse  around the mucous fistula itself, divided it down  to the fascia and dropped the colon back in.  There was still some residual  omentum and I took this off the colon and freed the colon up off the stomach  where it was stuck in its usual plane  to free up mesentery and I could  actually pull this colon down and get it from its left side well across the  midline into the right lower abdomen which is where the ileostomy was.  I  thought at this point I could do an anastomosis with zero tension and plenty  of overlap of the two ends of bowel.  I elected, therefore, not to do any  further dissection of small bowel in the pelvis which at the prior surgery  had been noted to be particularly difficult and almost frozen in.  I then resected about three inches of colon to get to good healthy colon.  I  divided the mesentery between clamps, ligated with 2-0 silk.  I took a  little more omentum off, what looked like omentum with adhesions, and then  divided the bowel over an Allen clamp.  I put a couple of stay sutures on  the mesentery and the mesenteric borders.  Likewise I resected a small segment of ileum to get back to what looked like  good healthy ileum with good blood supply and resected that over an Allen  clamp likewise and also put two holding sutures of 3-0 silk.  Again we had good mobility and no tension so I put a posterior row of full-  thickness 3-0 silks to close the back row, and tied these down as we put  them in.  They appeared to be nicely placed.  The anterior row was closed  with inverted sutures in the usual fashion until we had perhaps less than 1  cm left to close and this was closed with some Gambee-type sutures.  We  inspected the anastomosis and it appeared intact.  There had been vigorous  bleeding from the mucosal edges as we only cut it so I felt that we had an  excellent blood supply.  There was no tension.  There was no  mesentery to  close, and this laid nicely across the abdomen.  We irrigated and made sure everything appeared to be dry, which it did, and  prepared to close.  Because of marked adhesions, I put a full piece plus a  small second piece of Seprafilm in gently so that we might have a few less  adhesions should her abdomen ever have to be entered again.  The fascia was  then closed at the ileostomy fistula site with an interrupted 0 Novofil.  The midline was closed after I freed the skin and subcu off the fascia so I  could get good visualization of the fascia.  This was closed with a running  #1 PDS with several interrupted 0 Novofils spaced  out.  The wounds were irrigated and closed with staples with Telfa wicks  left behind between the staples.  The patient tolerated the procedure well.  There were no operative  complications.  All counts were correct.  Estimated blood loss was 100 mL.                                              Christian  Leta Jungling, M.D.    CJS/MEDQ  D:  12/19/2002  T:  12/19/2002  Job:  [id number]   cc:   Lina Sar, M.D. [facility]

## 2010-12-24 NOTE — Assessment & Plan Note (Signed)
[facility]                                 ON-CALL NOTE   [name], [name]                      MRN:          [id number]  DATE:10/30/2006                            DOB:          02/17/1928    PHONE NUMBER:  [phone].   PRIMARY CARE PHYSICIAN:  Georgina Quint. Plotnikov, M.D.   CALLER:  Sherald Barge, her daughter.   SUBJECTIVE:  Patient had not been eating and was vomiting significantly.  Daughter is calling to let me know she is on the way to the emergency  room.   ASSESSMENT/PLAN:  Emergency room evaluation.  The inpatient on-call  doctor will be notified if she is admitted to the hospital.     Kerby Nora, MD  Electronically Signed    AB/MedQ  DD: 10/30/2006  DT: 10/31/2006  Job #: [id number]

## 2010-12-24 NOTE — Consult Note (Signed)
NAMEKANDANCE, [name]               ACCOUNT NO.:  [id number]   MEDICAL RECORD NO.:  [id number]          PATIENT TYPE:  INP   LOCATION:  0155                         FACILITY:  [facility]   PHYSICIAN:  Alfonse Ras, MD   DATE OF BIRTH:  [birth year]   DATE OF CONSULTATION:  05/26/2006  DATE OF DISCHARGE:                                   CONSULTATION   This is a 77 year old female who was admitted with signs of urosepsis to Dr.  Valda Lamb service yesterday.  She has a very complex surgical history and  is well known to Dr. Cyndia Bent.  She has a history of endometrial  cancer with a hysterectomy and radiation therapy in the 1970s.  She had a  colon resection for cancer in 1989 by Dr. Jamey Ripa, what appears to be a low  anterior resection.  In 2004, she had a perforated colon on colonoscopy  which resulted in an ileostomy.  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  She  has been noted to have severe intra-abdominal as well as pelvic adhesions  and significant friable tissues secondary to radiation.  She has had  numerous stents beginning in 2004 placed by Dr. Aldean Ast when she presented  with a pelvic abscess.  She has been changing those on a routine basis.  There is a question of whether or not the patient has a rectovesical  fistula.  On admission to the hospital yesterday, she had bilateral  percutaneous nephrostomy tubes.  She presented after placement of those with  shaking chills and Dr. Aldean Ast admitted her to the hospital.   PHYSICAL EXAMINATION:  Her temperature is 99.1 degrees.  Her heart rate is  110 and blood pressure is 114/65.  She is a very thin white female whose  abdominal exam shows no tenderness.  I do not see any drainage from her  rectum and, actually, on rectal examination there is stool within the vault.   IMPRESSION:  Diagnosed colovesical fistula.  This patient has significant  intra-abdominal history of severe adhesions and radiation therapy, currently  is not appearing septic.  I think she is appropriately being managed with  hydration and antibiotics.  I do not see any need for surgical intervention  at this time.  If there are further requests for surgical intervention,  please contact us or Dr. Jamey Ripa next week.      Alfonse Ras, MD  Electronically Signed     KRE/MEDQ  D:  05/26/2006  T:  05/27/2006  Job:  [id number]   cc:   Courtney Paris, M.D.  Fax: [phone]

## 2010-12-24 NOTE — Op Note (Signed)
NAMESHEMECA, [name]               ACCOUNT NO.:  [id number]   MEDICAL RECORD NO.:  [id number]          PATIENT TYPE:  AMB   LOCATION:  NESC                         FACILITY:  [facility]   PHYSICIAN:  Courtney Paris, M.D.DATE OF BIRTH:  [birth year]   DATE OF PROCEDURE:  02/15/2006  DATE OF DISCHARGE:                                 OPERATIVE REPORT   PREOPERATIVE DIAGNOSIS:  Bilateral ureteral obstruction.   POSTOP DIAGNOSIS:  Bilateral ureteral obstruction.   OPERATIONS:  1.  Cystoscopy.  2.  Removal of bilateral ureteral stents (complicated).  3.  Right retrograde pyelogram.  4.  Insertion of bilateral ureteral stents.   ANESTHESIA:  General.   SURGEON:  Courtney Paris, M.D.   BRIEF HISTORY:  This 77 year old white female has bilateral ureteral  obstruction.  She has had stents since October 2004, last changed March  2007.  She has a problem with calcium buildup on the end of the stents; and  they have had to be changed in less than 3 months, until we started using  specially coated stents.  She has a history of endometrial cancer status  post hysterectomy and x-ray therapy in 1970; and also in 1989 diagnosed with  colon cancer with hemicolectomy, but had a fecal perforation during  colonoscopy and had multiple cutaneous enterofistulae that eventually  closed.  Her husband is on dialysis.  She has 3 children and enters now for  cystoscopy and removal of the stents, and replacement at this time.   The patient was placed on the operating room table in the dorsal lithotomy  position.  After satisfactory induction of general anesthesia, she was  prepped and draped in the usual sterile fashion.  She was given IV  antibiotics.  The panendoscope was inserted, and there was a lot of debris  in the bladder, as she normally has.  The stent, a Polaris-type, was seen on  the right, and pulled outside the urethral meatus.  I could not get a  guidewire through the catheter, because  it was obstructed; and I ended up  removing the stent and then passed a Polaris guidewire up past the first  level of obstruction, at the pelvic brim, up to the point at the UP junction  where she had another obstruction.   I then passed an open-ended catheter, but this would not go past the area of  the proximal stricture; and I had to perform a retrograde and then used a  Glidewire to eventually get this through the first area of narrowing.  There  was some dilation proximal to this.  Then at the UP junction.  There was  another area of sharp angulation of the ureter going back toward the kidney;  and, again, with fluoroscopy and a using a Glidewire.  Eventually I was able  to negotiate this and get it up into the renal pelvis.  I could then slide  the open-ended catheter up into the renal pelvis; and got a hydronephrotic  drip.  The Glidewire was then removed, and a guidewire inserted through the  open-ended catheter; and the open-ended  catheter was then removed.  Over the  guidewire I  passed a 7-French x 24 cm contour ureteral stent under  fluoroscopy.  The deep guidewire was then removed, leaving the stent in a  satisfactory location.  It had to be pulled back slightly so that it drained  the pelvis in a dependent fashion.   Next the left ureteral stent was grasped and pulled outside the urethral  meatus, but again the lumen was obstructed by stone.  I could not pass a  guidewire through this.  I pulled this out; and then passed a a sensor  guidewire up the ureter to the level of the renal pelvis; and then passed a  6-French x 24 cm, contour ureteral stent.  I pulled this back and then had  to adjust the catheter slightly with grasping forceps; but on fluoroscopy it  seemed to be draining in a good position.  The bladder was drained and a B&O  suppository inserted; and the patient taken to the recovery room in good  condition; and will try to go another 4 months before this is  changed.      Courtney Paris, M.D.  Electronically Signed     HMK/MEDQ  D:  02/15/2006  T:  02/15/2006  Job:  [id number]

## 2010-12-24 NOTE — Op Note (Signed)
NAME:  [name], SCHEID                         ACCOUNT NO.:  [id number]   MEDICAL RECORD NO.:  [id number]                   PATIENT TYPE:  INP   LOCATION:  0466                                 [facility]ACILITY:  [facility]   PHYSICIAN:  Currie Paris, M.D.           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   PROCEDURE:  Revision of ileostomy with placement of ileostomy tube.   SURGEON:  Currie Paris, M.D.   ASSISTANT:  Rose Phi. Maple Hudson, M.D.   ANESTHESIA:  General endotracheal.   CLINICAL HISTORY:  This patient has had a poorly-functioning, stenotic  ileostomy, which we have dilated a couple of times but continues to stenose  down.  It was recognized that at some point complete revision with either  takedown and redo through a transabdominal approach or takedown plus  reanastomosis to her distal rectum would be appropriate.  Nevertheless,  since she is just about eight weeks postop and we felt that that would be  extremely hazardous and likely to cause other bowel damage, we elected to  try to do a local revision of this ileostomy.   DESCRIPTION OF PROCEDURE:  The patient was seen in the holding area and had  no further questions.  She was taken to the operating room and after  satisfactory general endotracheal anesthesia had been obtained, the abdomen  was prepped and draped.  The old ileostomy site was in the right lower  quadrant and would just barely admit the tip of a hemostat.  There was some  granulation tissue around it.  I took a circular button of skin and  subcutaneous tissue around this, divided across it.  There was still some  stricture down in the subcutaneous tissue, which was stretched with a  hemostat.  As I gradually went a little bit deeper, I could see where there  appeared to be some bowel and was able to get a small catheter in this.  When I stretched it a little bit with a hemostat, we got return of enteric  contents and I felt that we were stretching this a little bit more.  I was  then able to barely put an 18 red rubber catheter in, and that seemed to go  somewhat inferior and lateral.  With a Tresa Endo then into the ileostomy, I  tried to open this a little bit further laterally with the cautery, trying  to open this up and clearly would not be able to get this up to skin level  but at least get the obstructive component relieved.  I was then able to get  the tip of my small finger to go through this and into the  ileum.  At this  point I elected not to do any further manipulation for fear that we would  disrupt where the ileostomy was stuck to the fascia.  I then took a 28  Malecot catheter and was able to pop this in subfascially and into the  bowel.  It irrigated nicely and some enteric contents was draining out.   I left this in situ and sutured it to the skin, thinking that at least we  could keep this irrigated and keep her decompressed with the plan being to  try to gain some time to allow the intra-abdominal processes from her last  surgery to settle down such that we could more safely do a definitive  revision and/or takedown.  The patient tolerated the procedure well.  There  were no operative complications noted.  All counts were correct.                                               Currie Paris, M.D.    CJS/MEDQ  D:  09/18/2002  T:  09/18/2002  Job:  [id number]   cc:   Lina Sar, M.D. [facility]

## 2010-12-24 NOTE — Discharge Summary (Signed)
[name], [name]               ACCOUNT NO.:  [id number]   MEDICAL RECORD NO.:  [id number]          PATIENT TYPE:  INP   LOCATION:  5711                         FACILITY:  [facility]   PHYSICIAN:  Currie Paris, M.D.DATE OF BIRTH:  [birth year]   DATE OF ADMISSION:  07/12/2006  DATE OF DISCHARGE:  07/30/2006                               DISCHARGE SUMMARY   FINAL DIAGNOSES:  1. Colovesical fistula.  2. Urinary tract infection secondary to #1.  3. 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  4. Pelvic radiation fibrosis.  5. Hypokalemia.  6. Emphysema.  7. Malnutrition.  8. Iron deficiency anemia.   CLINICAL HISTORY:  [name] is a 77 year old lady who has had the  development of colovesical fistula.  This has been treated with  bilateral percutaneous nephrostomy tubes.  Despite this she continued to  have chronic low grade bacterial infections, drainage from her bladder  and significant pain in the pelvic area.  This was thought to be  secondary to contamination from stool leakage through the fistula.   The fistula is a late complication of old radiation therapy for cervical  cancer which had been followed by a low anterior resection for colon  cancer and that was subsequently followed by an injury to the  anastomosis at the time of a perforation from a colonoscopy.  She had  eventually healed a fistula that was a coloenteric fistula a year  earlier but then developed this new fistula.  Because of ongoing  symptoms, she was admitted for a colostomy to divert the fecal stream  from the fistula.  Previous operative experiences with her abdomen had  suggested that she would not be a candidate to try to go in and repair  this.  In fact she had somewhat of a frozen abdomen making any  abdominal entry difficult.   HOSPITAL COURSE:  The patient was admitted and taken to the operating  room.  At the time of surgery a colonoscopy was done to attempt to  locate the colon since  she had had multiple previous operations and the  anatomy was thought possibly to be somewhat distorted.  The skin was  marked over the light from the colonoscopy and a colostomy done at that  point.   Postoperatively, she had improved pain control of her bladder area but  continued to have a lot of abdominal distension and pain and no function  out of the colostomy.  We got some urine cultures on her which showed  UTI and these were thought to be preexisting since these were done just  shortly after admission.  These were obtained from her nephrostomy  tubes.  After few days with no bowel function, I became concerned that  the ostomy, despite our attempts to localize the correct loop of colon  may have been misplaced.  We therefore put some contrast in the  colostomy and found that in fact the distal transverse colon was pulled  down into the pelvis and the end colostomy was actually the distal end  of the transverse colon instead of the proximal end of the descending  colon.   This was discussed with the patient and she was returned to the  operating room on 12/10.  Her colostomy was revised such that the  colostomy we made previously was closed off and dropped back in and the  proximal end brought out as an end colostomy leaving her with a fairly  long Hartmann.  At the time of surgery it was noted that we had  considerable difficulty just identifying any the structures in the  abdomen.   From this up postop course she had a fairly benign course.  She  continued to have problems with leakage and this improved with a Foley  catheter but overall was difficult to manage despite the nephrostomies.  Her  preexisting pelvic pain resolved with diversion of the fecal stream  indicating that indeed that was the cause of that pain.  She was  markedly malnourished and was placed on TNA.  Pre-albumin was noted to  be 3.6.  In addition, chest x-ray showed some infiltrated in the right  lung  and infectious disease consultation obtained and antibiotic support  managed for that as well.  It was not clear whether this was a  postoperative issue or whether there was some preexisting tissue there.  In addition, she was marked anemic on admission and evaluation showed  her to be markedly iron deficient so she was given some iron  supplementation.  This was thought to be from chronic illness.  Her  fever curves improved with antibiotics, she began to be able to eat.  Her free albumen began to come up and her ostomy was working  appropriately.  We did switch her to cyclical TNA.  Her nephrostomy tube  had to be readjusted at one point because of a change in drainage but  this also was doing fine.   By the time of discharge on 12/23 she was tolerating solid diet.  She  was able to go home on some antibiotics.  Her fever curves had improved.  She was able to manage her ostomy having had one of these previously.  She is also to be followed up in my office in approximately a week.   Data reviewed this admission included hemoglobins 9.3 at admission with  a white count 2.8.  This drifted down to 7.4 prior to initiation of  iron.  Electrolytes were basically unremarkable.  She did have some  hypokalemia before.  This was corrected to her TNA and excess potassium  administration.  Her total iron was less than 10.  Her B12 was 1274.  Her ferritin was 317.  Of note is she did have urinary tract infections  both with Candida and apparently an Enterococcus species.      Currie Paris, M.D.  Electronically Signed     CJS/MEDQ  D:  09/06/2006  T:  09/06/2006  Job:  [id number]   cc:   Hedwig Morton. Juanda Chance, MD  Georgina Quint Plotnikov, MD  Courtney Paris, M.D.

## 2010-12-24 NOTE — Consult Note (Addendum)
[name], [name]               ACCOUNT NO.:  [id number]   MEDICAL RECORD NO.:  [id number]          PATIENT TYPE:  INP   LOCATION:  6713                         FACILITY:  [facility]   PHYSICIAN:  Barbaraann Share, MD,FCCPDATE OF BIRTH:  1928-04-08   DATE OF CONSULTATION:  11/01/2006  DATE OF DISCHARGE:                                 CONSULTATION   PULMONARY CONSULTATION   HISTORY OF PRESENT ILLNESS:  The patient is a very pleasant 77 year old  white female who I have been asked to see for a persistently abnormal  chest x-ray.  The patient was in the hospital in December for many  various issues; and was found to have a right upper lung infiltrate at  that time.  She states that she had no symptoms consistent with  pneumonia, at that time; and was treated with very broad-spectrum  antibiotics.  She was admitted on October 31, 2006 to [facility] with  symptoms of possible small-bowel obstruction.  She has been followed  closely by general surgery.  Her chest x-ray showed persistent  infiltrate in the right upper lung zone; and she underwent a CT scan of  the chest which verified this to be in the top of the superior segment  of the right lower lobe, and also in the lingula.  There are two 1.5 cm  nodes; one being in the precarinal area, and the other being in the  subcarinal area.  The patient denies any pleuritic chest pain, cough,  hemoptysis, or purulence.   PAST MEDICAL HISTORY:  1. Quite extensive and includes vesicocolonic fistula and bilateral      ureteral obstruction requiring nephrostomies and many other      procedures.  2. History of TAH-BSO for cervical cancer.  3. Status post colon surgery for cancer in 1989.  4. History of COPD.  5. History of iron-deficiency anemia.   ALLERGIES:  The patient has no drug allergies.   SOCIAL HISTORY:  She has a history of smoking at least one pack a day  for 45+ years.  She has not smoked since her 65s.   FAMILY HISTORY:   Remarkable for hypertension, kidney disease, as well as  myocardial infarction.   REVIEW OF SYSTEMS:  As per history of present illness.  Also, see  further documentation in the chart.   PHYSICAL EXAMINATION:  GENERAL:  She is a thin female in no acute  distress.  VITAL SIGNS:  Blood pressure 90/60, pulse 62, temperature 98.0,  respiratory rate is 18.  O2 saturation  is 97%.  HEENT:  Pupils equal, round, and reacted to light and accommodation.  Extraocular muscles are intact.  Nares are patent without discharge.  Oropharynx is clear.  NECK:  Supple without JVD or lymphadenopathies no palpable thyromegaly.  CHEST:  Reveals dense crackles in the right upper lobe, but no wheezes.  CARDIAC EXAM:  Reveals a regular rate and rhythm.  ABDOMEN:  Mildly tender with decreased bowel.  GENITALIA:  ____ QA MARKER: 221 not indicated.  EXTREMITIES:  Lower extremities are without edema.  Pulses are intact  distally.  NEUROLOGICALLY:  Alert and oriented with no obvious motor deficits.   IMPRESSION:  Right upper lobe and lingular infiltrate that, I think, is  very suspicious for bronchoalveolar cell carcinoma.  I certainly could  give consideration to the possibility of a lymphoproliferative disorder  or possibly an inflammatory process such as bronchiolitis obliterans  with organizing pneumonia.  There is nothing clinically to suggest  infection at this time.   SUGGESTIONS:  Would recommend fiberoptic bronchoscopy with  transbronchial lung biopsies, it the patient wished to proceed a  diagnosis and treatment.  The patient feels that she has been through a  lot; and, at this point in time, she does not want to pursue this.  She  understands that this is likely to be a malignant process.  I would be  happy to see her, again; and discuss further evaluation if she wishes to  do so.      Barbaraann Share, MD,FCCP  Electronically Signed     KMC/MEDQ  D:  11/01/2006   T:  11/01/2006  Job:  [id number]   cc:   Currie Paris, M.D.  Georgina Quint. Plotnikov, MD

## 2010-12-24 NOTE — H&P (Signed)
NAME:  [name], [name]                         ACCOUNT NO.:  [id number]   MEDICAL RECORD NO.:  [id number]                   PATIENT TYPE:  INP   LOCATION:  0466                                 FACILITY:  [facility]   PHYSICIAN:  Lina Sar, M.D. [facility]               DATE OF BIRTH:  05/11/1928   DATE OF ADMISSION:  09/17/2002  DATE OF DISCHARGE:                                HISTORY & PHYSICAL   CHIEF COMPLAINT:  Abdominal pain, distention, nausea and vomiting with  stricture/stenosis at ileostomy.   HISTORY:  [place] is a very nice 77 year old white female known to Dr. Lina Sar with remote history of endometrial cancer for which she underwent  hysterectomy approximately 30 years ago and subsequent radiation therapy.  She did develop problems with radiation induced enteritis/colitis. She was  diagnosed with a sigmoid colon cancer in 1989 and underwent sigmoid  resection with Dr. Jamey Ripa. History is also pertinent for osteoporosis and  depression. The patient underwent colonoscopy by Dr. Lina Sar on July 11, 2002 for follow-up due to history of colon cancer. She was noted to have  a segmental colectomy. She had a 5 mm polyp in the sigmoid colon, a 6 mm  polyp in the cecum and another 10 mm sessile polyp in the cecum. Both of  these were snared with cautery and removed. Pathology from the polyps were  consistent with tubular adenoma. Unfortunately, the patient developed a  colon perforation and underwent emergency surgery in [place] with right  hemicolectomy, repair of the colotomy, creation of ileostomy and a nevus  fistula. She was transferred to [facility] one day postop for critical care  management secondary to respiratory insufficiency, ventilator management and  sepsis. She eventually improved and her ileostomy was functioning fairly  well and she was discharged on July 27, 2002. She did have a second  admission due to dehydration with high ostomy output and was  hospitalized  from December 24 through August 19, 2002 on the surgical service. She was  also managed by Dr. Lina Sar during that admission. She was found to have  a partial obstruction secondary to stenosis at her ileostomy. Due to her  nutritional status, it was felt that she should be nourished and improved  nutritionally prior to attempting another surgery. Apparently TNA was talked  about while she was in the hospital but she was hesitant to do this and  initially attempted oral feedings at home. Eventually she did undergo PICC  line placement and has now been on TNA over the past 2-3 weeks at home in a  cyclic manner 12 hours a day.   The patient says that her ileostomy was initially functioning fairly well.  She did have one visit outpatient with Dr. Jamey Ripa at which time he dilated  the ileostomy with a 16 mm catheter and this did seem to improve the output  for a while. Now  over the past few days, she has had progressively less and  less output and increased abdominal discomfort, distention, nausea and  vomiting. She has not been eating any solid food over the past few weeks and  has been attempting to take some liquids which intermittently she vomits  back up. She denies any fever or chills currently. She is having quite a bit  of discomfort and has been using hydrocodone at home which she says is no  longer effective. She is currently scheduled for surgery with Dr. Jamey Ripa  tomorrow September 18, 2002 for revision of her ileostomy. A decision was  made to admit her today after labs drawn because of TNA had shown a  potassium of 3. She is admitted at this time for hydration, pain control,  antiemetics and then replacement of her potassium and then planned surgery  with Dr. Jamey Ripa.   CURRENT MEDICATIONS:  1. Zoloft 100 mg p.o. q.d.  2. Temazepam 30 h.s.  3. Hydrocodone 5/550 q. 4-6h p.r.n.   ALLERGIES:  No known drug allergies.    PAST MEDICAL HISTORY:  1. As outlined  above pertinent for adenocarcinoma of the sigmoid colon in     1989 status post sigmoid resection per Dr. Jamey Ripa.  2. History of radiation induced enteritis and colitis.  3. History of endometrial cancer status post total hysterectomy     approximately 30 years ago with subsequent radiation therapy.  4. Osteoporosis.  5. Depression.  6. Colon polyps.   FAMILY HISTORY:  Father deceased secondary to an MI in his 56s. Mother  deceased with intestinal bleeding.   SOCIAL HISTORY:  The patient is married, lives with her husband who is  status post kidney transplant. She has been a smoker off and on over the  past 30 years, 1/2 pack per day, no regular ETOH.   REVIEW OF SYMPTOMS:  CARDIOVASCULAR:  Denies any chest pain or anginal  symptoms. PULMONARY:  Denies any cough, shortness of breath or sputum  productive. GENITOURINARY:  Currently denies any dysuria, urgency or  frequency. GI:  As outlined above. GENERAL:  The patient complains of  profound weakness and fatigue over the past month.   PHYSICAL EXAMINATION:  GENERAL:  Well-developed, thin, white female in no  acute distress. She is alert and oriented x3, uncomfortable and nauseated.  VITAL SIGNS:  Temperature 98.2, pulse 109, blood pressure 123/64, O2 sat is  100 on room air.  HEENT:  Normocephalic, atraumatic. EOMI. PERLA. Sclerae anicteric.  NECK:  Supple and without nodes.  CARDIOVASCULAR:  Regular rate and rhythm with S1 and S2. No murmur, gallop  or rub.  PULMONARY:  Clear to auscultation and percussion.  ABDOMEN:  Somewhat distended, bowel sounds are quite hypoactive. She is  rather diffusely tender. There is no rebound. The ileostomy appliance in the  right upper quadrant shows a stenotic appearing stoma. There are multiple  incisional scars and a fistula in the left mid quadrant. There is no mass or  hepatosplenomegaly.  RECTAL:  Not done today. EXTREMITIES:  Without cyanosis, clubbing or edema. Skin without lesion.   Pulses are intact. She has a PICC line in the right upper extremity.   IMPRESSION:  71. A 77 year old white female with ileostomy stenosis and secondary small     bowel obstruction.  2. Hypokalemia secondary to above.  3. Status post colon perforation at colonoscopy done December 2003 with     subsequent right hemicolectomy, closure of colotomy, creation of     ileostomy  and mucous fistula.  4. Malnutrition secondary to above.  5. History of radiation induced enteritis/colitis.  6. Remote history of endometrial cancer with radiation therapy.  7. History of colon cancer 1989 status post sigmoid resection.  8. Osteoporosis.  9. Depression.    PLAN:  The patient is admitted to the service of Dr. Lina Sar for IV  fluid hydration, pain management, antiemetics, potassium replacement, and  planned surgery with revision of her ileostomy per Dr. Jamey Ripa on September 18, 2002. For further details, please see the orders and the progress notes.     Mike Gip, P.A.-C. [facility]                Lina Sar, M.D. [facility]    AE/MEDQ  D:  09/17/2002  T:  09/17/2002  Job:  [id number]   cc:   Currie Paris, M.D.  [address], [address]  [place]  [place] [address]  Fax: [phone]

## 2010-12-24 NOTE — Op Note (Signed)
[name], [name]               ACCOUNT NO.:  [id number]   MEDICAL RECORD NO.:  [id number]          PATIENT TYPE:  AMB   LOCATION:  NESC                         [facility]ACILITY:  [facility]   PHYSICIAN:  Rozanna Boer., M.D.DATE OF BIRTH:  03-27-1928   DATE OF PROCEDURE:  06/09/2004  DATE OF DISCHARGE:                                 OPERATIVE REPORT   PREOPERATIVE DIAGNOSIS:  Bilateral ureteral obstruction (not stone).   POSTOPERATIVE DIAGNOSIS:  Bilateral ureteral obstruction (not stone).   OPERATION:  Cysto remove and exchange bilateral ureteral stents.   ANESTHESIA:  General.   SURGEON:  Courtney Paris, M.D.   HISTORY:  This 77 year old patient has had bilateral ureteral stents since  10/04.  She has bilateral obstruction secondary to multiple abdominal  operations.  She had endometrial cancer with surgery and radiation in 1970s  and colon cancer in 1989 and, after multiple operations, has an  enterocutaneous fistula in the left lower quadrant.  The last time we  changed this just 2 months ago, she had some stones on her stent that came  off in the ureter and had to be removed by ureteroscopy on the right side.  She tried Urised K but was unable to tolerate this at the higher doses to  try to prevent this from having to be done as often as it has been.  It has  only been 2 months and she comes in now for stent exchange at this time.   DESCRIPTION OF PROCEDURE:  The patient was placed on the operating table in  the dorsal lithotomy position after satisfactory induction of general LMA  anesthesia.  She was prepped and draped with Betadine in the usual sterile  fashion.  A #21 panendoscope was inserted.  The bladder was normal except  for the two stents coming from each ureteral orifice.  The right side was  grasped with forceps and pulled outside the urethral meatus.  Through this,  I could pass a guidewire up to the level of the kidney under fluoroscopy and  remove  the stent.  Over the guidewire, I then passed a 6 open-ended ureteral  catheter, removed the guidewire, and then performed a retrograde urogram.  This showed a little kink in the proximal ureter but I was able to then pass  the guidewire back through this up into the level of the kidney.  I removed  the open-ended catheter and then back-loaded this on the cystoscope and then  passed a 7-French x 24-cm length double-J ureteral stent under fluoroscopy.  This seemed to coil nicely in the renal pelvis as the guidewire was removed  and also in the bladder.  I was able to negotiate the proximal ureteral kink  and get this up in the renal pelvis as intended.   The left stent was then grasped and pulled outside the urethral meatus.  I  passed a guidewire through this under fluoroscopy, removed the stent, back-  loaded the guidewire on the cystoscope after injecting through the 6 open-  ended catheter, a retrograde demonstrating this too.  This outlined the  renal collecting system.  I re-passed the guidewire and then over this  passed a 7-French x 24 cm length double-J ureteral stent which again had a  nice coil in the renal pelvis and then a distal coil in the bladder.  Films  were made to document this.  The bladder was drained,  scope removed.  The patient was given 15 mg Toradol IV and was taken to the  recovery room in good condition.  We later discharged as an outpatient and  since this looked so much better than it did the last time, we will try to  go 3 months before her next stent change.  We will try a lower dose of  Urised K to see if this is better tolerated.      HMK/MEDQ  D:  06/09/2004  T:  06/09/2004  Job:  [id number]

## 2010-12-24 NOTE — Discharge Summary (Signed)
[name], [name]               ACCOUNT NO.:  [id number]   MEDICAL RECORD NO.:  [id number]          PATIENT TYPE:  INP   LOCATION:  4713                         FACILITY:  [facility]   PHYSICIAN:  Barbette Hair. Artist Pais, DO      DATE OF BIRTH:  1928/05/24   DATE OF ADMISSION:  08/05/2006  DATE OF DISCHARGE:  08/13/2006                               DISCHARGE SUMMARY   DISCHARGE DIAGNOSES:  1. Weakness/failure to thrive.  2. Malnutrition.  3. History of vesicocolonic fistula.  4. Status post bilateral nephrostomies.  5. Status post ileostomy.  6. Mild prerenal azotemia.   DISCHARGE MEDICATIONS:  1. Ditropan XL 15 mg 1-2 at bedtime.  2. Questran 4 mg twice daily.  3. Megace 400 mg/10 mL once daily.  4. Percocet 5/325 every 4-6 hours as needed for pain.   FOLLOWUP INSTRUCTIONS:  She is to follow up with Dr. Aldean Ast and call  his office at [phone], and also call Dr. Frederik Pear office for a followup  appointment.  As per urology, they recommend outpatient followup for  possible cysto and urodynamics.   HOSPITAL COURSE:  Patient is a complex 77 year old white female who was  hospitalized initially in December with diverting left colostomy for a  colovesicular fistula.  Patient is noted to have revision on July 17, 2006.  Patient was admitted for worsening weakness with anorexia.  Patient also noted to have worsening pain.   Patient underwent a urologic evaluation during her hospitalization  status post cystogram and her Foley was removed.  They reported still a  leakage of urine through her rectum that was chronic and needed followup  as an outpatient.  Patient was afebrile.  Her p.o. intake increased and  she was passing clear urine without difficulty.   In terms of her malnutrition, her TNA was discontinued during this  hospitalization.  She will likely need to continue her protein  supplements as an outpatient.   CONDITION ON DISCHARGE:  Improved.      Barbette Hair. Artist Pais, DO  Electronically Signed     RDY/MEDQ  D:  08/13/2006  T:  08/13/2006  Job:  [id number]

## 2010-12-24 NOTE — H&P (Signed)
   NAME:  [name], [name]                         ACCOUNT NO.:  [id number]   MEDICAL RECORD NO.:  [id number]                   PATIENT TYPE:  INP   LOCATION:  5712                                 FACILITY:  [facility]   PHYSICIAN:  Jimmye Norman III, M.D.               DATE OF BIRTH:  27-Mar-1928   DATE OF ADMISSION:  07/31/2002  DATE OF DISCHARGE:                                HISTORY & PHYSICAL   CHIEF COMPLAINT:  The patient is a 77 year old status post ileostomy for a  perforated colon who now comes in because of low blood pressure and  possibility of dehydration.   HISTORY OF PRESENT ILLNESS:  I was called by the home health care nurse and  my office was called because the patient was complaining of being extremely  weak.  Upon evaluation by the home health care nurse, she was found to be  markedly dehydrated and with a low blood pressure in the 70s.  She was asked  to come in to the emergency room where the observation of the low blood  pressure was confirmed with a pressure of 86/75.  She was mildly tachycardic  at 96 and was markedly dehydrated.  I came to see the patient to admit her  for dehydration and observation.   PHYSICAL EXAMINATION:  SKIN:  On exam, her skin turgor is decreased.  HEENT:  She has a fur tongue with a dry nose.  Her eyes are sunken in and  blood pressure was low as mentioned.  ABDOMEN:  Ileostomy was draining somewhat and she had a mucus fistula on the  left side which appeared to be healthy and viable.  She had normoactive  bowel sounds.   LABORATORY DATA:  Her BUN was 89, creatinine was 4.0.  This was markedly  different from what they were like before admission discharge where she was  16 and 0.7.   IMPRESSION:  Because of the severe amount of dehydration, she is being  readmitted with intravenous hydration.  She is getting multiple liters of  saline prior to going up to the floor.  Her hemoglobin is only 10 and her  hematocrit is 30.  With that, she  will likely drop down and become  hemodiluted.  May require blood during admission.                                               Kathrin Ruddy, M.D.    JW/MEDQ  D:  07/31/2002  T:  08/01/2002  Job:  [id number]

## 2011-01-07 ENCOUNTER — Encounter: Payer: Self-pay | Admitting: Internal Medicine

## 2011-01-07 ENCOUNTER — Ambulatory Visit (INDEPENDENT_AMBULATORY_CARE_PROVIDER_SITE_OTHER): Payer: Medicare Other | Admitting: Internal Medicine

## 2011-01-07 DIAGNOSIS — M545 Low back pain, unspecified: Secondary | ICD-10-CM

## 2011-01-07 DIAGNOSIS — E559 Vitamin D deficiency, unspecified: Secondary | ICD-10-CM

## 2011-01-07 DIAGNOSIS — E538 Deficiency of other specified B group vitamins: Secondary | ICD-10-CM

## 2011-01-07 DIAGNOSIS — F329 Major depressive disorder, single episode, unspecified: Secondary | ICD-10-CM

## 2011-01-07 MED ORDER — OXYCODONE-ACETAMINOPHEN 10-325 MG PO TABS: ORAL_TABLET | ORAL | Status: DC

## 2011-01-07 NOTE — Progress Notes (Signed)
  Subjective:    Patient ID: [name], female    DOB: 06-15-1928, 77 y.o.   MRN: [id number]  HPI     Review of Systems     Objective:   Physical Exam        Assessment & Plan:    Subjective:    Patient ID: [name], female    DOB: [birth year], 77 y.o.   MRN: [id number]  HPI   The patient is here to follow up on chronic depression, anxiety, headaches and chronic moderate to severe abdominal pain symptoms controlled with medicines. F/u on wt loss.     Review of Systems  Constitutional: Positive for fatigue. Negative for activity change and appetite change.  HENT: Negative for mouth sores, neck pain and postnasal drip.   Eyes: Negative for photophobia and pain.  Respiratory: Negative for shortness of breath.   Gastrointestinal: Positive for abdominal pain (controlled) and abdominal distention. Negative for blood in stool.  Genitourinary: Negative for dysuria and pelvic pain.  Musculoskeletal: Positive for back pain.  Skin: Negative for pallor.  Neurological: Negative for headaches.  Psychiatric/Behavioral: Negative for suicidal ideas.       Objective:   Physical Exam  Constitutional: She appears well-developed.       Thin  HENT:  Head: Normocephalic.  Mouth/Throat: No oropharyngeal exudate.  Eyes: Pupils are equal, round, and reactive to light. Left eye exhibits no discharge.  Neck: No JVD present.  Cardiovascular: Normal rate.  Exam reveals no gallop.   No murmur heard. Pulmonary/Chest: She has no wheezes.  Abdominal: She exhibits no mass. There is tenderness (diffuse).  Musculoskeletal: She exhibits no edema and no tenderness.  Lymphadenopathy:    She has no cervical adenopathy.  Psychiatric: Judgment and thought content normal.       Less depressed          Assessment & Plan:   FLATULANCE  We can try activated charcoal  Flagyl/Align did not work - intol of Flagyl

## 2011-01-07 NOTE — Patient Instructions (Signed)
You can try Activated Charcoal tablets for gas

## 2011-01-08 ENCOUNTER — Encounter: Payer: Self-pay | Admitting: Internal Medicine

## 2011-01-08 NOTE — Assessment & Plan Note (Signed)
On Rx 

## 2011-01-08 NOTE — Assessment & Plan Note (Signed)
Discussed - doing fair

## 2011-03-03 ENCOUNTER — Ambulatory Visit (INDEPENDENT_AMBULATORY_CARE_PROVIDER_SITE_OTHER): Payer: Medicare Other | Admitting: Internal Medicine

## 2011-03-03 ENCOUNTER — Encounter: Payer: Self-pay | Admitting: Internal Medicine

## 2011-03-03 DIAGNOSIS — M545 Low back pain, unspecified: Secondary | ICD-10-CM

## 2011-03-03 DIAGNOSIS — E538 Deficiency of other specified B group vitamins: Secondary | ICD-10-CM

## 2011-03-03 DIAGNOSIS — R634 Abnormal weight loss: Secondary | ICD-10-CM

## 2011-03-03 DIAGNOSIS — N390 Urinary tract infection, site not specified: Secondary | ICD-10-CM

## 2011-03-03 DIAGNOSIS — F329 Major depressive disorder, single episode, unspecified: Secondary | ICD-10-CM

## 2011-03-03 DIAGNOSIS — E559 Vitamin D deficiency, unspecified: Secondary | ICD-10-CM

## 2011-03-03 DIAGNOSIS — F3289 Other specified depressive episodes: Secondary | ICD-10-CM

## 2011-03-03 MED ORDER — TEMAZEPAM 30 MG PO CAPS: 30.0000 mg | ORAL_CAPSULE | Freq: Every evening | ORAL | Status: DC | PRN

## 2011-03-03 MED ORDER — OXYCODONE-ACETAMINOPHEN 10-325 MG PO TABS: ORAL_TABLET | ORAL | Status: DC

## 2011-03-03 NOTE — Assessment & Plan Note (Signed)
Re-start Vit D 

## 2011-03-03 NOTE — Assessment & Plan Note (Signed)
Re- start Vit B12 °

## 2011-03-03 NOTE — Assessment & Plan Note (Signed)
On Rx 

## 2011-03-03 NOTE — Progress Notes (Signed)
  Subjective:    Patient ID: [name], female    DOB: 31-Aug-1927, 77 y.o.   MRN: [id number]  HPI  The patient presents for a follow-up of  chronic Vit B12, D def, chronic UTIs, chron abd pain controlled with medicines She started Vemma drinks and stopped Vit D and B12   Review of Systems  Constitutional: Positive for fatigue. Negative for chills, activity change, appetite change and unexpected weight change.  HENT: Negative for congestion, mouth sores and sinus pressure.   Eyes: Negative for visual disturbance.  Respiratory: Negative for cough and chest tightness.   Gastrointestinal: Positive for abdominal pain. Negative for nausea.  Genitourinary: Negative for frequency, difficulty urinating and vaginal pain.  Musculoskeletal: Positive for back pain. Negative for gait problem.  Skin: Negative for pallor and rash.  Neurological: Negative for dizziness, tremors, weakness, numbness and headaches.  Psychiatric/Behavioral: Positive for sleep disturbance. Negative for confusion. The patient is nervous/anxious.        Wt Readings from Last 3 Encounters:  03/03/11 106 lb (48.081 kg)  01/07/11 104 lb (47.174 kg)  11/10/10 109 lb (49.442 kg)    Objective:   Physical Exam  Constitutional: She appears well-developed. No distress.       Thin   HENT:  Head: Normocephalic.  Right Ear: External ear normal.  Left Ear: External ear normal.  Nose: Nose normal.  Mouth/Throat: Oropharynx is clear and moist.  Eyes: Conjunctivae are normal. Pupils are equal, round, and reactive to light. Right eye exhibits no discharge. Left eye exhibits no discharge.  Neck: Normal range of motion. Neck supple. No JVD present. No tracheal deviation present. No thyromegaly present.  Cardiovascular: Normal rate, regular rhythm and normal heart sounds.   Pulmonary/Chest: No stridor. No respiratory distress. She has no wheezes.  Abdominal: Soft. Bowel sounds are normal. She exhibits no distension and no mass.  There is tenderness. There is no rebound and no guarding.  Musculoskeletal: She exhibits no edema and no tenderness.  Lymphadenopathy:    She has no cervical adenopathy.  Neurological: She displays normal reflexes. No cranial nerve deficit. She exhibits normal muscle tone. Coordination normal.  Skin: No rash noted. She is not diaphoretic. No erythema.  Psychiatric: Her behavior is normal. Judgment and thought content normal.          Assessment & Plan:

## 2011-03-03 NOTE — Assessment & Plan Note (Signed)
Better  

## 2011-03-14 ENCOUNTER — Ambulatory Visit: Payer: Medicare Other | Admitting: Internal Medicine

## 2011-04-04 ENCOUNTER — Telehealth: Payer: Self-pay | Admitting: Internal Medicine

## 2011-04-04 NOTE — Telephone Encounter (Signed)
Patient calling to report for the last couple of weeks, she is having problems with her colostomy. States she may go 2-3 days without having any bowel movement at all. She is c/o cramps during these episodes. States she also has 2 nephrostomy's. She did not want to schedule with anyone but Dr. Juanda Chance. Explained to patient that she is out of office this week and I will discuss with Mike Gip, PA and call her with a plan. Hx colon cancer, endometrial cancer, radiation enteritis, cecal perforation during colonoscopy, diverting colonoscopy and takedown colonoscopy then diverting colonoscopy again. Last OV-2007. Discussed with Mike Gip, PA and patient to come today or tomorrow for 2 view abdominal xrays. Patient aware and she will come tomorrow for this.

## 2011-04-05 ENCOUNTER — Ambulatory Visit (INDEPENDENT_AMBULATORY_CARE_PROVIDER_SITE_OTHER)
Admission: RE | Admit: 2011-04-05 | Discharge: 2011-04-05 | Disposition: A | Discharge status: Home / Self Care | Payer: Medicare Other | Source: Ambulatory Visit | Attending: Physician Assistant | Admitting: Physician Assistant

## 2011-04-05 DIAGNOSIS — R109 Unspecified abdominal pain: Secondary | ICD-10-CM

## 2011-04-06 ENCOUNTER — Telehealth: Payer: Self-pay | Admitting: *Deleted

## 2011-04-06 NOTE — Telephone Encounter (Signed)
Patient left a message asking for results of abdominal xray. Spoke with Mike Gip, PA. She reviewed the results of xray and recommends that if patient is having output from colostomy and is not vomiting to schedule her with Dr. Juanda Chance next week. Spoke with patient and she states she did have output from colostomy yesterday. States she vomited a small amount once on Sunday but has not had anymore vomiting. Scheduled patient on 04/12/11 at 8:30 AM with Dr. Juanda Chance.

## 2011-04-06 NOTE — Telephone Encounter (Signed)
Reviewed and agree.

## 2011-04-08 ENCOUNTER — Encounter: Payer: Self-pay | Admitting: Internal Medicine

## 2011-04-08 ENCOUNTER — Encounter: Payer: Self-pay | Admitting: Gastroenterology

## 2011-04-12 ENCOUNTER — Ambulatory Visit (INDEPENDENT_AMBULATORY_CARE_PROVIDER_SITE_OTHER): Payer: Medicare Other | Admitting: Internal Medicine

## 2011-04-12 ENCOUNTER — Encounter: Payer: Self-pay | Admitting: Internal Medicine

## 2011-04-12 VITALS — BP 100/52 | HR 64 | Ht 64.0 in | Wt 100.6 lb

## 2011-04-12 DIAGNOSIS — R195 Other fecal abnormalities: Secondary | ICD-10-CM

## 2011-04-12 DIAGNOSIS — K56609 Unspecified intestinal obstruction, unspecified as to partial versus complete obstruction: Secondary | ICD-10-CM

## 2011-04-12 MED ORDER — POLYETHYLENE GLYCOL 3350 17 GM/SCOOP PO POWD: ORAL | Status: DC

## 2011-04-12 NOTE — Progress Notes (Signed)
[name] [birth year] MRN [id number]     History of Present Illness:  This is an 77 year old white female with a history of colon cancer in 1989 and endometrial cancer in 1970ies.. She has a history of pelvic radiation causing radiation enteritis. She had a cecal perforation during colonoscopy requiring a diverting colostomy with subsequent takedown of the colostomy in 2006. There is also a history of colovesicular fistula resulting in a new diverting colostomy. She is here today because of decreased output from her colostomy. She has been having occasional episodes of abdominal pain consistent with partial small bowel obstruction. In the last 3 weeks, she has had 4 episodes. She has percutaneous nephrostomy tubes. She has a history of B12 deficiency and iron deficiency. She used to weigh 140 pounds but she is currently 100 pounds.  Past Medical History  Diagnosis Date  . History of colon cancer   . Depression   . Osteoporosis     Pt declined Rx  . Anemia   . LBP (low back pain)   . History of nephrolithiasis   . Ureteral stenosis   . Vitamin D deficiency   . Vitamin B12 deficiency   . Endometrial cancer   . Anal fistula   . Colon polyp   . Small bowel obstruction   . Radiation fibrosis     pelvic  . Malnutrition   . Fistula     vesicocolonic  . Colon perforation    Past Surgical History  Procedure Date  . Abdominal hysterectomy   . Colectomy   . Diverting colostomy     with later takedown  . Nephrostomy 2008    bilateral- Dr. Serena Colonel  . Urinary diversion   . Tonsillectomy   . Abcess drainage     reports that she has been smoking.  She has never used smokeless tobacco. She reports that she drinks alcohol. She reports that she does not use illicit drugs. family history includes Heart disease in her father; Hypertension in her other; and Kidney disease in her other. Allergies  Allergen Reactions  . Ciprofloxacin   . Flagyl (Metronidazole Hcl)     nausea  .  Gabapentin   . Raloxifene   . Venlafaxine         Review of Systems: Denies heartburn, chest pain, dysphagia, shortness of breath  The remainder of the 10  point ROS is negative except as outlined in H&P   Physical Exam: General appearance  Well developed, in no distress, thin, alert and oriented. Eyes- non icteric. HEENT nontraumatic, normocephalic. Mouth no lesions, tongue papillated, no cheilosis. Neck supple without adenopathy, thyroid not enlarged, no carotid bruits, no JVD. Lungs Clear to auscultation bilaterally. Cor normal S1 normal S2, regular rhythm , no murmur,  quiet precordium. Abdomen soft scaphoid abdomen with multiple surgical scars and colostomy appliance in left middle quadrant. Bowel sounds are normoactive. There is no tympany or tenderness. There is no hernia. Rectal status a/p resection, digital exam through colostomy shows soft Hemoccult-positive stool. Extremities no pedal edema. Skin no lesions. Neurological alert and oriented x 3. Psychological normal mood and affect.  Assessment and Plan:  Problem #1 decreased output from colostomy. This is consistent with a partial small bowel obstruction or with decreased motility. We will proceed with a small bowel follow-through to assess her anatomy. I have asked her to start MiraLax 9 g every other day. I have instructed her on a low residue diet with small frequent feedings. Her heme positive stool is chronic  due to radiation enteritis. She will resume her iron supplements.  Problem #2 history of colon cancer. Patient is status post resection. She is status post cecal perforation during a prior colonoscopy. She is at high risk for perforation.if we try to repeat the colonoscopy. Therefore, no there are no  plans for colonoscopy.  Problem #3 Weight loss. She will supplement her diet with Carnation breakfast and boost.   04/12/2011 Lina Sar

## 2011-04-12 NOTE — Patient Instructions (Signed)
You have been scheduled for a small bowel follow thru at [facility] (1st Floor) on 04/18/11 @ 9:00 am. Please arrive at 8:45 am for registration. Make certain not to have anything to eat or drink 6 hours prior to your test. We have sent the following medications to your pharmacy for you to pick up at your convenience: Miralax 9 gram (1/2 capful) dissolved in at least 8 ounces water/juice and drink once every OTHER day. CC: Dr A.Plotnikov

## 2011-04-15 ENCOUNTER — Encounter (INDEPENDENT_AMBULATORY_CARE_PROVIDER_SITE_OTHER): Payer: Self-pay | Admitting: Surgery

## 2011-04-15 ENCOUNTER — Ambulatory Visit (INDEPENDENT_AMBULATORY_CARE_PROVIDER_SITE_OTHER): Payer: Medicare Other | Admitting: Surgery

## 2011-04-15 VITALS — BP 94/64 | HR 60

## 2011-04-15 DIAGNOSIS — K9403 Colostomy malfunction: Secondary | ICD-10-CM | POA: Insufficient documentation

## 2011-04-15 DIAGNOSIS — IMO0002 Reserved for concepts with insufficient information to code with codable children: Secondary | ICD-10-CM

## 2011-04-15 NOTE — Progress Notes (Signed)
The patient comes in to discuss some issues she's had with her colostomy. She recently saw her gastroenterologist, Dr.Brodie. The patient intermittently has issues with some abdominal pain and low output from her colostomy but they seemed to resolve spontaneously. She really just wanted to see me because she had her dye was one to retire soon and wanted to be sure there were no issues that need to be addressed from a surgical standpoint and also be sure she had someone could look after her after my retirement. I did tell her I wasn't planning to retire for while yet.  She has maintained her weight around 100 210 pounds. She eats okay just have nausea vomiting fever or chills. Her colostomy does not always work well with occasional leakage because of trouble fitting of the ostomy appliance. I have asked the senior grainy these tissues for a few years.  Her past history family history and review of systems have all been reviewed in attic including her most recent note by Dr.Brodie. Physical exam: Gen.: The patient is alert oriented thin but healthy-appearing. Abdomen: The abdomen is soft flat and nontender. All incisions have healed well. There is no evidence of mass. There's no evidence of hernia. There is no hepatosplenomegaly. Data reviewed: See history of present illness  Impressions: Status post colostomy for chronic colovesical fistula. No evidence currently of any problems  Plan: I told I would see her back here p.r.n. She is scheduled to have a barium enema soon. I reminded her that she also needed to have this section of her colon from her rectum to her colostomy studied. That is a closed loop.

## 2011-04-15 NOTE — Patient Instructions (Signed)
If Dr Juanda Chance wants me to see you just make an appointment

## 2011-04-18 ENCOUNTER — Encounter (INDEPENDENT_AMBULATORY_CARE_PROVIDER_SITE_OTHER): Payer: Self-pay | Admitting: Surgery

## 2011-04-18 ENCOUNTER — Telehealth: Payer: Self-pay | Admitting: Internal Medicine

## 2011-04-18 ENCOUNTER — Ambulatory Visit (HOSPITAL_COMMUNITY)
Admission: RE | Admit: 2011-04-18 | Discharge: 2011-04-18 | Disposition: A | Discharge status: Home / Self Care | Payer: Medicare Other | Source: Ambulatory Visit | Attending: Internal Medicine | Admitting: Internal Medicine

## 2011-04-18 DIAGNOSIS — K56609 Unspecified intestinal obstruction, unspecified as to partial versus complete obstruction: Secondary | ICD-10-CM | POA: Insufficient documentation

## 2011-04-18 DIAGNOSIS — Z933 Colostomy status: Secondary | ICD-10-CM | POA: Insufficient documentation

## 2011-04-18 NOTE — Telephone Encounter (Signed)
Patient state she saw Dr. Juanda Chance at the hospital when she had her Barium Xray and she was suppose to call her but she has not heard from her. Per Dr. Juanda Chance- continue Miralax and I will check on her in AM.

## 2011-04-19 NOTE — Telephone Encounter (Signed)
Spoke with patient. She has not passed anything yet. She took a full dose of Miralax and has not had any results.

## 2011-04-20 ENCOUNTER — Telehealth: Payer: Self-pay | Admitting: *Deleted

## 2011-04-20 NOTE — Telephone Encounter (Signed)
Spoke with patient and gave her results and Dr. Regino Schultze recommendations. She has not had output from colostomy since Monday. Called and spoke with Vaughan Basta at Dr. Tenna Child office and she will call me back with an appointment.

## 2011-04-20 NOTE — Telephone Encounter (Signed)
Message copied by Daphine Deutscher on Wed Apr 20, 2011  8:31 AM ------      Message from: Hart Carwin      Created: Tue Apr 19, 2011  9:57 PM       Please call pt with results, partial SBO on x-ray, please continue to take Miralax. Make appointment with Dr Jamey Ripa to discuss options.

## 2011-04-20 NOTE — Telephone Encounter (Signed)
See phone note on 04/20/11

## 2011-04-20 NOTE — Telephone Encounter (Signed)
[name] with Dr. Jamey Ripa called back and states he is messaging Dr. Juanda Chance that patient is not a surgical candidate and will need a KUB.

## 2011-04-20 NOTE — Telephone Encounter (Signed)
Spoke with patient she will come on 04/21/11 for the KUB.

## 2011-04-20 NOTE — Telephone Encounter (Signed)
Message copied by Daphine Deutscher on Wed Apr 20, 2011 11:05 AM ------      Message from: Hart Carwin      Created: Wed Apr 20, 2011 10:35 AM      Regarding: follow up KUB       Rene Kocher, I spoke to Dr Jamey Ripa, He wants her to have a follow up KUB in next 2 days to make sure all her barium is out.  Thanx DB

## 2011-04-21 ENCOUNTER — Ambulatory Visit (INDEPENDENT_AMBULATORY_CARE_PROVIDER_SITE_OTHER)
Admission: RE | Admit: 2011-04-21 | Discharge: 2011-04-21 | Disposition: A | Discharge status: Home / Self Care | Payer: Medicare Other | Source: Ambulatory Visit | Attending: Internal Medicine | Admitting: Internal Medicine

## 2011-04-21 DIAGNOSIS — R109 Unspecified abdominal pain: Secondary | ICD-10-CM

## 2011-04-22 ENCOUNTER — Telehealth: Payer: Self-pay | Admitting: *Deleted

## 2011-04-22 NOTE — Telephone Encounter (Signed)
Patient given results and Dr. Regino Schultze recommendation

## 2011-04-22 NOTE — Telephone Encounter (Signed)
Message copied by Daphine Deutscher on Fri Apr 22, 2011  9:29 AM ------      Message from: Hart Carwin      Created: Thu Apr 21, 2011  9:33 PM       Please call pt, almost all barium cleared, continue Miralax

## 2011-05-04 LAB — CBC
HCT: 30.4 — ABNORMAL LOW
Hemoglobin: 10.6 — ABNORMAL LOW
Hemoglobin: 10.6 — ABNORMAL LOW
Hemoglobin: 10.7 — ABNORMAL LOW
MCHC: 33.6
MCHC: 34.9
Platelets: 158
RBC: 3.33 — ABNORMAL LOW
RBC: 3.43 — ABNORMAL LOW
RDW: 13.9
RDW: 14.2
WBC: 11.1 — ABNORMAL HIGH
WBC: 11.6 — ABNORMAL HIGH

## 2011-05-04 LAB — BASIC METABOLIC PANEL
BUN: 14
CO2: 21
CO2: 22
Calcium: 9.1
Calcium: 9.2
Calcium: 9.3
Creatinine, Ser: 1.27 — ABNORMAL HIGH
GFR calc Af Amer: 49 — ABNORMAL LOW
GFR calc non Af Amer: 39 — ABNORMAL LOW
Glucose, Bld: 102 — ABNORMAL HIGH
Glucose, Bld: 107 — ABNORMAL HIGH
Glucose, Bld: 92
Sodium: 136
Sodium: 141

## 2011-05-04 LAB — DIFFERENTIAL
Basophils Absolute: 0.1
Basophils Relative: 1
Eosinophils Relative: 0
Monocytes Absolute: 0.4

## 2011-05-04 LAB — HEPATIC FUNCTION PANEL
ALT: 13
AST: 20
Albumin: 2.8 — ABNORMAL LOW
Alkaline Phosphatase: 73
Total Protein: 5.4 — ABNORMAL LOW

## 2011-05-04 LAB — URINALYSIS, ROUTINE W REFLEX MICROSCOPIC
Bilirubin Urine: NEGATIVE
Protein, ur: >300 — AB
Specific Gravity, Urine: 1.022
Urobilinogen, UA: 0.2

## 2011-05-04 LAB — CULTURE, BLOOD (ROUTINE X 2)

## 2011-05-04 LAB — COMPREHENSIVE METABOLIC PANEL
AST: 19
Albumin: 3.7
Alkaline Phosphatase: 93
Chloride: 108
GFR calc Af Amer: 56 — ABNORMAL LOW
Potassium: 3.6
Sodium: 141
Total Bilirubin: 0.8

## 2011-05-04 LAB — LACTIC ACID, PLASMA: Lactic Acid, Venous: 1.2

## 2011-05-04 LAB — URINE MICROSCOPIC-ADD ON

## 2011-05-04 LAB — URINE CULTURE

## 2011-05-04 LAB — APTT: aPTT: 29

## 2011-05-08 ENCOUNTER — Other Ambulatory Visit: Payer: Self-pay | Admitting: Internal Medicine

## 2011-05-11 ENCOUNTER — Encounter: Payer: Self-pay | Admitting: Internal Medicine

## 2011-05-11 ENCOUNTER — Ambulatory Visit (INDEPENDENT_AMBULATORY_CARE_PROVIDER_SITE_OTHER): Payer: Medicare Other | Admitting: Internal Medicine

## 2011-05-11 ENCOUNTER — Other Ambulatory Visit (INDEPENDENT_AMBULATORY_CARE_PROVIDER_SITE_OTHER): Payer: Medicare Other

## 2011-05-11 ENCOUNTER — Telehealth: Payer: Self-pay | Admitting: Internal Medicine

## 2011-05-11 VITALS — BP 100/68 | HR 83 | Temp 97.8°F | Resp 16 | Wt 98.0 lb

## 2011-05-11 DIAGNOSIS — R1084 Generalized abdominal pain: Secondary | ICD-10-CM

## 2011-05-11 DIAGNOSIS — E559 Vitamin D deficiency, unspecified: Secondary | ICD-10-CM

## 2011-05-11 DIAGNOSIS — M545 Low back pain, unspecified: Secondary | ICD-10-CM

## 2011-05-11 DIAGNOSIS — N390 Urinary tract infection, site not specified: Secondary | ICD-10-CM

## 2011-05-11 DIAGNOSIS — Z23 Encounter for immunization: Secondary | ICD-10-CM

## 2011-05-11 DIAGNOSIS — E538 Deficiency of other specified B group vitamins: Secondary | ICD-10-CM

## 2011-05-11 DIAGNOSIS — R634 Abnormal weight loss: Secondary | ICD-10-CM

## 2011-05-11 LAB — CBC WITH DIFFERENTIAL/PLATELET
Eosinophils Relative: 2.7 % (ref 0.0–5.0)
HCT: 36.9 % (ref 36.0–46.0)
Hemoglobin: 12.1 g/dL (ref 12.0–15.0)
Lymphs Abs: 0.5 10*3/uL — ABNORMAL LOW (ref 0.7–4.0)
Monocytes Relative: 7.8 % (ref 3.0–12.0)
Neutro Abs: 2.7 10*3/uL (ref 1.4–7.7)
RBC: 3.94 Mil/uL (ref 3.87–5.11)
WBC: 3.6 10*3/uL — ABNORMAL LOW (ref 4.5–10.5)

## 2011-05-11 LAB — COMPREHENSIVE METABOLIC PANEL
BUN: 20 mg/dL (ref 6–23)
CO2: 29 mEq/L (ref 19–32)
Creatinine, Ser: 1.2 mg/dL (ref 0.4–1.2)
GFR: 46.22 mL/min — ABNORMAL LOW (ref >60.00)
Glucose, Bld: 98 mg/dL (ref 70–99)
Total Bilirubin: 1 mg/dL (ref 0.3–1.2)

## 2011-05-11 MED ORDER — OXYCODONE-ACETAMINOPHEN 10-325 MG PO TABS: ORAL_TABLET | ORAL | Status: DC

## 2011-05-11 NOTE — Assessment & Plan Note (Signed)
Continue with current prescription therapy as reflected on the Med list.  

## 2011-05-11 NOTE — Assessment & Plan Note (Signed)
Chronic due to renal stents, adhesions  Potential benefits of a long term opioids use as well as potential risks (i.e. addiction risk, apnea etc) and complications (i.e. Somnolence, constipation and others) were explained to the patient and were aknowledged.

## 2011-05-11 NOTE — Telephone Encounter (Signed)
Pt informed. Copies mailed.

## 2011-05-11 NOTE — Telephone Encounter (Signed)
[name], please, inform patient that all labs are normal except for elev LFTs. If abd pain comes back - RTC soon for a recheck/more tests pls  Please, mail the labs to the patient.    Thx

## 2011-05-11 NOTE — Progress Notes (Signed)
  Subjective:    Patient ID: [name], female    DOB: 11/14/1927, 77 y.o.   MRN: [id number]  HPI   The patient is here to follow up on chronic depression, anxiety, headaches and chronic moderate abd pain and LBP symptoms controlled with medicines   Review of Systems  Constitutional: Negative for chills, activity change, appetite change, fatigue and unexpected weight change.  HENT: Negative for congestion, mouth sores and sinus pressure.   Eyes: Negative for visual disturbance.  Respiratory: Negative for cough and chest tightness.   Gastrointestinal: Positive for abdominal pain. Negative for nausea.  Genitourinary: Negative for frequency, difficulty urinating and vaginal pain.  Musculoskeletal: Positive for back pain. Negative for gait problem.  Skin: Negative for pallor and rash.  Neurological: Negative for dizziness, tremors, weakness, numbness and headaches.  Psychiatric/Behavioral: Negative for confusion and sleep disturbance. The patient is nervous/anxious.    Wt Readings from Last 3 Encounters:  05/11/11 98 lb (44.453 kg)  04/12/11 100 lb 9.6 oz (45.632 kg)  03/03/11 106 lb (48.081 kg)       Objective:   Physical Exam  Constitutional: She appears well-developed and well-nourished. No distress.  HENT:  Head: Normocephalic.  Right Ear: External ear normal.  Left Ear: External ear normal.  Nose: Nose normal.  Mouth/Throat: Oropharynx is clear and moist.  Eyes: Conjunctivae are normal. Pupils are equal, round, and reactive to light. Right eye exhibits no discharge. Left eye exhibits no discharge.  Neck: Normal range of motion. Neck supple. No JVD present. No tracheal deviation present. No thyromegaly present.  Cardiovascular: Normal rate, regular rhythm and normal heart sounds.   Pulmonary/Chest: No stridor. No respiratory distress. She has no wheezes.  Abdominal: Soft. Bowel sounds are normal. She exhibits no distension and no mass. There is tenderness. There is no  rebound and no guarding.  Musculoskeletal: She exhibits no edema and no tenderness.  Lymphadenopathy:    She has no cervical adenopathy.  Neurological: She displays normal reflexes. No cranial nerve deficit. She exhibits normal muscle tone. Coordination normal.  Skin: No rash noted. No erythema.  Psychiatric: She has a normal mood and affect. Her behavior is normal. Judgment and thought content normal.       subdued          Assessment & Plan:

## 2011-05-11 NOTE — Patient Instructions (Signed)
Try Creon 1 w/meals

## 2011-05-12 LAB — VITAMIN D 25 HYDROXY (VIT D DEFICIENCY, FRACTURES): Vit D, 25-Hydroxy: 37 ng/mL (ref 30–89)

## 2011-05-23 LAB — CBC
HCT: 29.3 — ABNORMAL LOW
MCHC: 34.2
MCV: 91.4
RBC: 3.21 — ABNORMAL LOW

## 2011-05-23 LAB — BASIC METABOLIC PANEL
BUN: 9
CO2: 33 — ABNORMAL HIGH
Chloride: 101
Creatinine, Ser: 0.75
Potassium: 3 — ABNORMAL LOW

## 2011-05-24 LAB — BASIC METABOLIC PANEL
CO2: 33 — ABNORMAL HIGH
Calcium: 10.3
Creatinine, Ser: 1.02
GFR calc Af Amer: >60
Glucose, Bld: 107 — ABNORMAL HIGH

## 2011-05-24 LAB — URINE MICROSCOPIC-ADD ON

## 2011-05-24 LAB — DIFFERENTIAL
Basophils Absolute: 0
Basophils Relative: 0
Neutro Abs: 7.2
Neutrophils Relative %: 90 — ABNORMAL HIGH

## 2011-05-24 LAB — URINALYSIS, ROUTINE W REFLEX MICROSCOPIC
Nitrite: NEGATIVE
Specific Gravity, Urine: 1.021
Urobilinogen, UA: 1

## 2011-05-24 LAB — CBC
MCHC: 34.1
Platelets: 196
RDW: 13.7

## 2011-08-10 ENCOUNTER — Ambulatory Visit (INDEPENDENT_AMBULATORY_CARE_PROVIDER_SITE_OTHER): Payer: Medicare Other | Admitting: Internal Medicine

## 2011-08-10 ENCOUNTER — Encounter: Payer: Self-pay | Admitting: Internal Medicine

## 2011-08-10 DIAGNOSIS — M545 Low back pain, unspecified: Secondary | ICD-10-CM

## 2011-08-10 DIAGNOSIS — N390 Urinary tract infection, site not specified: Secondary | ICD-10-CM

## 2011-08-10 DIAGNOSIS — R634 Abnormal weight loss: Secondary | ICD-10-CM

## 2011-08-10 DIAGNOSIS — F329 Major depressive disorder, single episode, unspecified: Secondary | ICD-10-CM

## 2011-08-10 DIAGNOSIS — E538 Deficiency of other specified B group vitamins: Secondary | ICD-10-CM

## 2011-08-10 DIAGNOSIS — F3289 Other specified depressive episodes: Secondary | ICD-10-CM

## 2011-08-10 MED ORDER — OXYCODONE-ACETAMINOPHEN 10-325 MG PO TABS: ORAL_TABLET | ORAL | Status: DC

## 2011-08-10 MED ORDER — TEMAZEPAM 30 MG PO CAPS: 30.0000 mg | ORAL_CAPSULE | Freq: Every evening | ORAL | Status: DC | PRN

## 2011-08-10 NOTE — Assessment & Plan Note (Signed)
Check T at home (asymptomatic 99.9 today)

## 2011-08-10 NOTE — Assessment & Plan Note (Signed)
Continue with current prescription therapy as reflected on the Med list.  

## 2011-08-10 NOTE — Progress Notes (Signed)
  Subjective:    Patient ID: [name], female    DOB: [birth year], 78 y.o.   MRN: [id number]  HPI   The patient is here to follow up on chronic depression, anxiety, headaches and chronic diarrhea, abd and back pain  symptoms controlled with medicines, diet    Review of Systems  Constitutional: Negative for chills, activity change, appetite change, fatigue and unexpected weight change.  HENT: Negative for hearing loss, congestion, mouth sores and sinus pressure.   Eyes: Negative for visual disturbance.  Respiratory: Negative for cough and chest tightness.   Gastrointestinal: Positive for abdominal pain and diarrhea. Negative for nausea.  Genitourinary: Negative for frequency, difficulty urinating and vaginal pain.  Musculoskeletal: Negative for back pain and gait problem.  Skin: Negative for pallor and rash.  Neurological: Negative for dizziness, tremors, weakness, numbness and headaches.  Psychiatric/Behavioral: Negative for confusion, sleep disturbance and dysphoric mood.   Wt Readings from Last 3 Encounters:  08/10/11 102 lb (46.267 kg)  05/11/11 98 lb (44.453 kg)  04/12/11 100 lb 9.6 oz (45.632 kg)       Objective:   Physical Exam  Constitutional: She appears well-developed. No distress.  HENT:  Head: Normocephalic.  Right Ear: External ear normal.  Left Ear: External ear normal.  Nose: Nose normal.  Mouth/Throat: Oropharynx is clear and moist.  Eyes: Conjunctivae are normal. Pupils are equal, round, and reactive to light. Right eye exhibits no discharge. Left eye exhibits no discharge.  Neck: Normal range of motion. Neck supple. No JVD present. No tracheal deviation present. No thyromegaly present.  Cardiovascular: Normal rate, regular rhythm and normal heart sounds.   Pulmonary/Chest: No stridor. No respiratory distress. She has no wheezes.  Abdominal: Soft. Bowel sounds are normal. She exhibits no distension and no mass. There is no tenderness. There is no rebound  and no guarding.  Musculoskeletal: She exhibits no edema and no tenderness.  Lymphadenopathy:    She has no cervical adenopathy.  Neurological: She displays normal reflexes. No cranial nerve deficit. She exhibits normal muscle tone. Coordination normal.  Skin: No rash noted. She is not diaphoretic. No erythema.  Psychiatric: She has a normal mood and affect. Her behavior is normal. Judgment and thought content normal.  Colostomy        Assessment & Plan:

## 2011-08-10 NOTE — Patient Instructions (Signed)
Wt Readings from Last 3 Encounters:  08/10/11 102 lb (46.267 kg)  05/11/11 98 lb (44.453 kg)  04/12/11 100 lb 9.6 oz (45.632 kg)

## 2011-08-10 NOTE — Assessment & Plan Note (Signed)
Better  

## 2011-10-24 ENCOUNTER — Ambulatory Visit (INDEPENDENT_AMBULATORY_CARE_PROVIDER_SITE_OTHER): Payer: Medicare Other | Admitting: Internal Medicine

## 2011-10-24 ENCOUNTER — Encounter: Payer: Self-pay | Admitting: Internal Medicine

## 2011-10-24 VITALS — BP 90/62 | HR 80 | Temp 97.0°F | Resp 16 | Wt 109.0 lb

## 2011-10-24 DIAGNOSIS — E559 Vitamin D deficiency, unspecified: Secondary | ICD-10-CM

## 2011-10-24 DIAGNOSIS — M545 Low back pain, unspecified: Secondary | ICD-10-CM

## 2011-10-24 DIAGNOSIS — E538 Deficiency of other specified B group vitamins: Secondary | ICD-10-CM

## 2011-10-24 DIAGNOSIS — R1084 Generalized abdominal pain: Secondary | ICD-10-CM

## 2011-10-24 DIAGNOSIS — F329 Major depressive disorder, single episode, unspecified: Secondary | ICD-10-CM

## 2011-10-24 MED ORDER — IBUPROFEN 600 MG PO TABS: 600.0000 mg | ORAL_TABLET | ORAL | Status: DC | PRN

## 2011-10-24 MED ORDER — MIRTAZAPINE 15 MG PO TABS: 15.0000 mg | ORAL_TABLET | Freq: Every day | ORAL | Status: DC

## 2011-10-24 MED ORDER — OXYCODONE-ACETAMINOPHEN 10-325 MG PO TABS: ORAL_TABLET | ORAL | Status: DC

## 2011-10-24 MED ORDER — POLYETHYLENE GLYCOL 3350 17 GM/SCOOP PO POWD: ORAL | Status: DC

## 2011-10-24 NOTE — Assessment & Plan Note (Signed)
Continue with current prescription therapy as reflected on the Med list.  

## 2011-10-24 NOTE — Progress Notes (Signed)
Patient ID: [name], female   DOB: 02-03-1928, 78 y.o.   MRN: [id number]  Subjective:    Patient ID: [name], female    DOB: 01/23/1928, 78 y.o.   MRN: [id number]  HPI   The patient is here to follow up on chronic depression, anxiety, headaches and chronic diarrhea, abd and back pain  symptoms controlled with medicines, diet   Wt Readings from Last 3 Encounters:  10/24/11 109 lb (49.442 kg)  08/10/11 102 lb (46.267 kg)  05/11/11 98 lb (44.453 kg)   BP Readings from Last 3 Encounters:  10/24/11 90/62  08/10/11 112/60  05/11/11 100/68      Review of Systems  Constitutional: Negative for chills, activity change, appetite change, fatigue and unexpected weight change.  HENT: Negative for hearing loss, congestion, mouth sores and sinus pressure.   Eyes: Negative for visual disturbance.  Respiratory: Negative for cough and chest tightness.   Gastrointestinal: Positive for abdominal pain and diarrhea. Negative for nausea.  Genitourinary: Negative for frequency, difficulty urinating and vaginal pain.  Musculoskeletal: Negative for back pain and gait problem.  Skin: Negative for pallor and rash.  Neurological: Negative for dizziness, tremors, weakness, numbness and headaches.  Psychiatric/Behavioral: Negative for confusion, sleep disturbance and dysphoric mood.        Objective:   Physical Exam  Constitutional: She appears well-developed. No distress.  HENT:  Head: Normocephalic.  Right Ear: External ear normal.  Left Ear: External ear normal.  Nose: Nose normal.  Mouth/Throat: Oropharynx is clear and moist.  Eyes: Conjunctivae are normal. Pupils are equal, round, and reactive to light. Right eye exhibits no discharge. Left eye exhibits no discharge.  Neck: Normal range of motion. Neck supple. No JVD present. No tracheal deviation present. No thyromegaly present.  Cardiovascular: Normal rate, regular rhythm and normal heart sounds.   Pulmonary/Chest: No stridor.  No respiratory distress. She has no wheezes.  Abdominal: Soft. Bowel sounds are normal. She exhibits no distension and no mass. There is no tenderness. There is no rebound and no guarding.  Musculoskeletal: She exhibits no edema and no tenderness.  Lymphadenopathy:    She has no cervical adenopathy.  Neurological: She displays normal reflexes. No cranial nerve deficit. She exhibits normal muscle tone. Coordination normal.  Skin: No rash noted. She is not diaphoretic. No erythema.  Psychiatric: She has a normal mood and affect. Her behavior is normal. Judgment and thought content normal.  Colostomy   Lab Results  Component Value Date   WBC 3.6* 05/11/2011   HGB 12.1 05/11/2011   HCT 36.9 05/11/2011   PLT 145.0* 05/11/2011   GLUCOSE 98 05/11/2011   CHOL 148 04/29/2010   TRIG 98.0 04/29/2010   HDL 58.50 04/29/2010   LDLCALC 70 04/29/2010   ALT 74* 05/11/2011   AST 71* 05/11/2011   NA 137 05/11/2011   K 4.5 05/11/2011   CL 102 05/11/2011   CREATININE 1.2 05/11/2011   BUN 20 05/11/2011   CO2 29 05/11/2011   TSH 2.63 09/15/2010   INR 1.1 12/07/2007        Assessment & Plan:

## 2011-10-30 ENCOUNTER — Other Ambulatory Visit: Payer: Self-pay | Admitting: Internal Medicine

## 2011-10-31 ENCOUNTER — Telehealth: Payer: Self-pay | Admitting: *Deleted

## 2011-10-31 NOTE — Telephone Encounter (Signed)
Requested Medications     mirtazapine (REMERON) 15 MG tablet [Pharmacy Med Name: MIRTAZAPINE 15 MG TABLET]   The source prescription has been discontinued.   TAKE 1 TABLET BY MOUTH AT BEDTIME   Disp: 30 tablet R: 5 Start: 10/30/2011  Class: Normal   Requested on: 05/09/2011   Originally ordered on: 11/08/2010  Last refill: 09/29/2011

## 2011-10-31 NOTE — Telephone Encounter (Signed)
Ok x 12 mo Thx 

## 2011-11-01 MED ORDER — MIRTAZAPINE 15 MG PO TABS: 15.0000 mg | ORAL_TABLET | Freq: Every day | ORAL | Status: DC

## 2011-11-01 NOTE — Telephone Encounter (Signed)
Done

## 2011-11-15 ENCOUNTER — Other Ambulatory Visit (INDEPENDENT_AMBULATORY_CARE_PROVIDER_SITE_OTHER): Payer: Medicare Other

## 2011-11-15 DIAGNOSIS — R1084 Generalized abdominal pain: Secondary | ICD-10-CM

## 2011-11-15 DIAGNOSIS — E559 Vitamin D deficiency, unspecified: Secondary | ICD-10-CM

## 2011-11-15 DIAGNOSIS — M545 Low back pain, unspecified: Secondary | ICD-10-CM

## 2011-11-15 DIAGNOSIS — E538 Deficiency of other specified B group vitamins: Secondary | ICD-10-CM

## 2011-11-15 DIAGNOSIS — F329 Major depressive disorder, single episode, unspecified: Secondary | ICD-10-CM

## 2011-11-15 LAB — CBC WITH DIFFERENTIAL/PLATELET
Eosinophils Relative: 4.7 % (ref 0.0–5.0)
HCT: 35.4 % — ABNORMAL LOW (ref 36.0–46.0)
Hemoglobin: 11.6 g/dL — ABNORMAL LOW (ref 12.0–15.0)
Lymphocytes Relative: 13.4 % (ref 12.0–46.0)
Lymphs Abs: 0.4 10*3/uL — ABNORMAL LOW (ref 0.7–4.0)
Monocytes Relative: 7.4 % (ref 3.0–12.0)
Neutro Abs: 2.5 10*3/uL (ref 1.4–7.7)
Platelets: 115 10*3/uL — ABNORMAL LOW (ref 150.0–400.0)
WBC: 3.3 10*3/uL — ABNORMAL LOW (ref 4.5–10.5)

## 2011-11-15 LAB — TSH: TSH: 2.41 u[IU]/mL (ref 0.35–5.50)

## 2011-11-15 LAB — HEPATIC FUNCTION PANEL
ALT: 229 U/L — ABNORMAL HIGH (ref 0–35)
AST: 128 U/L — ABNORMAL HIGH (ref 0–37)
Alkaline Phosphatase: 181 U/L — ABNORMAL HIGH (ref 39–117)
Total Bilirubin: 0.9 mg/dL (ref 0.3–1.2)

## 2011-11-15 LAB — BASIC METABOLIC PANEL
GFR: 62.91 mL/min (ref >60.00)
Potassium: 3.6 mEq/L (ref 3.5–5.1)
Sodium: 141 mEq/L (ref 135–145)

## 2011-12-30 ENCOUNTER — Encounter: Payer: Self-pay | Admitting: Internal Medicine

## 2011-12-30 ENCOUNTER — Ambulatory Visit (INDEPENDENT_AMBULATORY_CARE_PROVIDER_SITE_OTHER): Payer: BC Managed Care – PPO | Admitting: Internal Medicine

## 2011-12-30 VITALS — BP 92/52 | HR 76 | Temp 97.6°F | Resp 16 | Wt 104.0 lb

## 2011-12-30 DIAGNOSIS — R634 Abnormal weight loss: Secondary | ICD-10-CM

## 2011-12-30 DIAGNOSIS — M545 Low back pain, unspecified: Secondary | ICD-10-CM

## 2011-12-30 DIAGNOSIS — D649 Anemia, unspecified: Secondary | ICD-10-CM

## 2011-12-30 DIAGNOSIS — R7989 Other specified abnormal findings of blood chemistry: Secondary | ICD-10-CM

## 2011-12-30 DIAGNOSIS — F3289 Other specified depressive episodes: Secondary | ICD-10-CM

## 2011-12-30 DIAGNOSIS — F329 Major depressive disorder, single episode, unspecified: Secondary | ICD-10-CM

## 2011-12-30 DIAGNOSIS — E538 Deficiency of other specified B group vitamins: Secondary | ICD-10-CM

## 2011-12-30 MED ORDER — OXYCODONE-ACETAMINOPHEN 10-325 MG PO TABS: ORAL_TABLET | ORAL | Status: DC

## 2011-12-30 NOTE — Assessment & Plan Note (Signed)
She declined a w/up. Recheck in 1 mo

## 2011-12-30 NOTE — Assessment & Plan Note (Signed)
Anemia of chronic disease Monitoring labs

## 2011-12-30 NOTE — Assessment & Plan Note (Signed)
Continue with current prescription therapy as reflected on the Med list.  

## 2011-12-30 NOTE — Patient Instructions (Signed)
Wt Readings from Last 3 Encounters:  12/30/11 104 lb (47.174 kg)  10/24/11 109 lb (49.442 kg)  08/10/11 102 lb (46.267 kg)

## 2011-12-30 NOTE — Progress Notes (Signed)
Patient ID: [name], female   DOB: 24-Jun-1928, 78 y.o.   MRN: [id number] Patient ID: [name], female   DOB: Dec 18, 1927, 78 y.o.   MRN: [id number]  Subjective:    Patient ID: [name], female    DOB: [birth year], 78 y.o.   MRN: [id number]  HPI   The patient is here to follow up on chronic depression, anxiety, headaches and chronic diarrhea, abd and back pain  symptoms controlled with medicines, diet   Wt Readings from Last 3 Encounters:  12/30/11 104 lb (47.174 kg)  10/24/11 109 lb (49.442 kg)  08/10/11 102 lb (46.267 kg)   BP Readings from Last 3 Encounters:  12/30/11 92/52  10/24/11 90/62  08/10/11 112/60      Review of Systems  Constitutional: Negative for chills, activity change, appetite change, fatigue and unexpected weight change.  HENT: Negative for hearing loss, congestion, mouth sores and sinus pressure.   Eyes: Negative for visual disturbance.  Respiratory: Negative for cough and chest tightness.   Gastrointestinal: Positive for abdominal pain and diarrhea. Negative for nausea.  Genitourinary: Negative for frequency, difficulty urinating and vaginal pain.  Musculoskeletal: Negative for back pain and gait problem.  Skin: Negative for pallor and rash.  Neurological: Negative for dizziness, tremors, weakness, numbness and headaches.  Psychiatric/Behavioral: Negative for confusion, sleep disturbance and dysphoric mood.        Objective:   Physical Exam  Constitutional: She appears well-developed. No distress.  HENT:  Head: Normocephalic.  Right Ear: External ear normal.  Left Ear: External ear normal.  Nose: Nose normal.  Mouth/Throat: Oropharynx is clear and moist.  Eyes: Conjunctivae are normal. Pupils are equal, round, and reactive to light. Right eye exhibits no discharge. Left eye exhibits no discharge.  Neck: Normal range of motion. Neck supple. No JVD present. No tracheal deviation present. No thyromegaly present.  Cardiovascular: Normal  rate, regular rhythm and normal heart sounds.   Pulmonary/Chest: No stridor. No respiratory distress. She has no wheezes.  Abdominal: Soft. Bowel sounds are normal. She exhibits no distension and no mass. There is no tenderness. There is no rebound and no guarding.  Musculoskeletal: She exhibits no edema and no tenderness.  Lymphadenopathy:    She has no cervical adenopathy.  Neurological: She displays normal reflexes. No cranial nerve deficit. She exhibits normal muscle tone. Coordination normal.  Skin: No rash noted. She is not diaphoretic. No erythema.  Psychiatric: She has a normal mood and affect. Her behavior is normal. Judgment and thought content normal.  Colostomy   Lab Results  Component Value Date   WBC 3.3* 11/15/2011   HGB 11.6* 11/15/2011   HCT 35.4* 11/15/2011   PLT 115.0* 11/15/2011   GLUCOSE 86 11/15/2011   CHOL 148 04/29/2010   TRIG 98.0 04/29/2010   HDL 58.50 04/29/2010   LDLCALC 70 04/29/2010   ALT 229* 11/15/2011   AST 128* 11/15/2011   NA 141 11/15/2011   K 3.6 11/15/2011   CL 106 11/15/2011   CREATININE 0.9 11/15/2011   BUN 14 11/15/2011   CO2 26 11/15/2011   TSH 2.41 11/15/2011   INR 1.1 12/07/2007        Assessment & Plan:

## 2011-12-30 NOTE — Assessment & Plan Note (Signed)
Watching  

## 2012-02-10 ENCOUNTER — Telehealth: Payer: Self-pay | Admitting: *Deleted

## 2012-02-10 NOTE — Telephone Encounter (Signed)
Rf req for Temazepam 30 mg 1 po qhs prn. Ok to Rf?

## 2012-02-12 NOTE — Telephone Encounter (Signed)
OK to fill this prescription with additional refills x3 Thank you!  

## 2012-02-13 MED ORDER — TEMAZEPAM 30 MG PO CAPS: 30.0000 mg | ORAL_CAPSULE | Freq: Every evening | ORAL | Status: DC | PRN

## 2012-02-13 NOTE — Telephone Encounter (Signed)
Done

## 2012-03-07 ENCOUNTER — Other Ambulatory Visit (INDEPENDENT_AMBULATORY_CARE_PROVIDER_SITE_OTHER): Payer: BC Managed Care – PPO

## 2012-03-07 ENCOUNTER — Encounter: Payer: Self-pay | Admitting: Internal Medicine

## 2012-03-07 ENCOUNTER — Ambulatory Visit (INDEPENDENT_AMBULATORY_CARE_PROVIDER_SITE_OTHER): Payer: BC Managed Care – PPO | Admitting: Internal Medicine

## 2012-03-07 VITALS — BP 100/70 | HR 80 | Temp 97.4°F | Resp 16 | Wt 100.2 lb

## 2012-03-07 DIAGNOSIS — M545 Low back pain, unspecified: Secondary | ICD-10-CM

## 2012-03-07 DIAGNOSIS — E538 Deficiency of other specified B group vitamins: Secondary | ICD-10-CM

## 2012-03-07 DIAGNOSIS — R634 Abnormal weight loss: Secondary | ICD-10-CM

## 2012-03-07 DIAGNOSIS — F329 Major depressive disorder, single episode, unspecified: Secondary | ICD-10-CM

## 2012-03-07 DIAGNOSIS — N390 Urinary tract infection, site not specified: Secondary | ICD-10-CM | POA: Insufficient documentation

## 2012-03-07 DIAGNOSIS — F3289 Other specified depressive episodes: Secondary | ICD-10-CM

## 2012-03-07 DIAGNOSIS — D649 Anemia, unspecified: Secondary | ICD-10-CM

## 2012-03-07 LAB — CBC WITH DIFFERENTIAL/PLATELET
Basophils Absolute: 0 10*3/uL (ref 0.0–0.1)
Eosinophils Absolute: 0.2 10*3/uL (ref 0.0–0.7)
Lymphocytes Relative: 15.6 % (ref 12.0–46.0)
MCHC: 33.6 g/dL (ref 30.0–36.0)
MCV: 93.1 fl (ref 78.0–100.0)
Monocytes Absolute: 0.3 10*3/uL (ref 0.1–1.0)
Neutrophils Relative %: 74.6 % (ref 43.0–77.0)
Platelets: 142 10*3/uL — ABNORMAL LOW (ref 150.0–400.0)
RBC: 4.29 Mil/uL (ref 3.87–5.11)
RDW: 14.6 % (ref 11.5–14.6)

## 2012-03-07 LAB — URINALYSIS, ROUTINE W REFLEX MICROSCOPIC
Bilirubin Urine: NEGATIVE
Ketones, ur: NEGATIVE
Specific Gravity, Urine: >=1.03 (ref 1.000–1.030)
Urine Glucose: NEGATIVE
Urobilinogen, UA: 0.2 (ref 0.0–1.0)

## 2012-03-07 LAB — BASIC METABOLIC PANEL
BUN: 41 mg/dL — ABNORMAL HIGH (ref 6–23)
CO2: 23 mEq/L (ref 19–32)
Calcium: 10.4 mg/dL (ref 8.4–10.5)
Creatinine, Ser: 1.4 mg/dL — ABNORMAL HIGH (ref 0.4–1.2)
Glucose, Bld: 92 mg/dL (ref 70–99)

## 2012-03-07 LAB — IBC PANEL
Iron: 69 ug/dL (ref 42–145)
Saturation Ratios: 18.9 % — ABNORMAL LOW (ref 20.0–50.0)
Transferrin: 260.8 mg/dL (ref 212.0–360.0)

## 2012-03-07 LAB — HEPATIC FUNCTION PANEL
Bilirubin, Direct: 0.1 mg/dL (ref 0.0–0.3)
Total Bilirubin: 0.4 mg/dL (ref 0.3–1.2)

## 2012-03-07 LAB — VITAMIN B12: Vitamin B-12: >1500 pg/mL — ABNORMAL HIGH (ref 211–911)

## 2012-03-07 MED ORDER — OXYCODONE-ACETAMINOPHEN 10-325 MG PO TABS: ORAL_TABLET | ORAL | Status: DC

## 2012-03-07 MED ORDER — SULFAMETHOXAZOLE-TRIMETHOPRIM 400-80 MG PO TABS: 1.0000 | ORAL_TABLET | Freq: Two times a day (BID) | ORAL | Status: AC

## 2012-03-07 NOTE — Assessment & Plan Note (Signed)
UA  Cx Start abx F/u w/Urol

## 2012-03-07 NOTE — Assessment & Plan Note (Signed)
UA Cx Abx if needed

## 2012-03-07 NOTE — Assessment & Plan Note (Signed)
Continue with current prescription therapy as reflected on the Med list.  

## 2012-03-07 NOTE — Progress Notes (Signed)
   Subjective:    Patient ID: [name], female    DOB: 1927-11-21, 78 y.o.   MRN: [id number]  HPI   The patient is here to follow up on chronic depression, anxiety, headaches and chronic diarrhea, abd and back pain  symptoms controlled with medicines, diet  C/o UTIs  Wt Readings from Last 3 Encounters:  03/07/12 100 lb 4 oz (45.473 kg)  12/30/11 104 lb (47.174 kg)  10/24/11 109 lb (49.442 kg)   BP Readings from Last 3 Encounters:  03/07/12 100/70  12/30/11 92/52  10/24/11 90/62      Review of Systems  Constitutional: Negative for chills, activity change, appetite change, fatigue and unexpected weight change.  HENT: Negative for hearing loss, congestion, mouth sores and sinus pressure.   Eyes: Negative for visual disturbance.  Respiratory: Negative for cough and chest tightness.   Gastrointestinal: Positive for abdominal pain and diarrhea. Negative for nausea.  Genitourinary: Negative for frequency, difficulty urinating and vaginal pain.  Musculoskeletal: Negative for back pain and gait problem.  Skin: Negative for pallor and rash.  Neurological: Negative for dizziness, tremors, weakness, numbness and headaches.  Psychiatric/Behavioral: Negative for confusion, disturbed wake/sleep cycle and dysphoric mood.        Objective:   Physical Exam  Constitutional: She appears well-developed. No distress.  HENT:  Head: Normocephalic.  Right Ear: External ear normal.  Left Ear: External ear normal.  Nose: Nose normal.  Mouth/Throat: Oropharynx is clear and moist.  Eyes: Conjunctivae are normal. Pupils are equal, round, and reactive to light. Right eye exhibits no discharge. Left eye exhibits no discharge.  Neck: Normal range of motion. Neck supple. No JVD present. No tracheal deviation present. No thyromegaly present.  Cardiovascular: Normal rate, regular rhythm and normal heart sounds.   Pulmonary/Chest: No stridor. No respiratory distress. She has no wheezes.    Abdominal: Soft. Bowel sounds are normal. She exhibits no distension and no mass. There is no tenderness. There is no rebound and no guarding.  Musculoskeletal: She exhibits no edema and no tenderness.  Lymphadenopathy:    She has no cervical adenopathy.  Neurological: She displays normal reflexes. No cranial nerve deficit. She exhibits normal muscle tone. Coordination normal.  Skin: No rash noted. She is not diaphoretic. No erythema.  Psychiatric: She has a normal mood and affect. Her behavior is normal. Judgment and thought content normal.  Colostomy   Lab Results  Component Value Date   WBC 3.3* 11/15/2011   HGB 11.6* 11/15/2011   HCT 35.4* 11/15/2011   PLT 115.0* 11/15/2011   GLUCOSE 86 11/15/2011   CHOL 148 04/29/2010   TRIG 98.0 04/29/2010   HDL 58.50 04/29/2010   LDLCALC 70 04/29/2010   ALT 229* 11/15/2011   AST 128* 11/15/2011   NA 141 11/15/2011   K 3.6 11/15/2011   CL 106 11/15/2011   CREATININE 0.9 11/15/2011   BUN 14 11/15/2011   CO2 26 11/15/2011   TSH 2.41 11/15/2011   INR 1.1 12/07/2007        Assessment & Plan:

## 2012-03-08 ENCOUNTER — Telehealth: Payer: Self-pay | Admitting: Internal Medicine

## 2012-03-08 NOTE — Telephone Encounter (Signed)
Copies mailed

## 2012-03-08 NOTE — Telephone Encounter (Signed)
Stacey, please, inform patient that she has a UTI - take abx Thx 

## 2012-03-08 NOTE — Telephone Encounter (Signed)
Pt calling today 03/08/12 regarding missed call from [place] in office.  [place] pulled chart in EPIC and per note from Dr. Posey Rea, advised pt she has UTI and to take the ABX.  Pt verbalized understanding of MD's message.  Pt would like copies of all lab results mailed to her at [address] [place], [address]. Please call pt back at [phone] for any questions or concerns.

## 2012-03-08 NOTE — Telephone Encounter (Signed)
Left mess for patient to call back.  

## 2012-03-10 LAB — CULTURE, URINE COMPREHENSIVE: Colony Count: >=100000

## 2012-05-07 ENCOUNTER — Ambulatory Visit (INDEPENDENT_AMBULATORY_CARE_PROVIDER_SITE_OTHER): Payer: Medicare Other | Admitting: Internal Medicine

## 2012-05-07 ENCOUNTER — Encounter: Payer: Self-pay | Admitting: Internal Medicine

## 2012-05-07 VITALS — BP 110/70 | HR 64 | Temp 97.3°F | Resp 16 | Wt 100.2 lb

## 2012-05-07 DIAGNOSIS — E559 Vitamin D deficiency, unspecified: Secondary | ICD-10-CM

## 2012-05-07 DIAGNOSIS — R252 Cramp and spasm: Secondary | ICD-10-CM

## 2012-05-07 DIAGNOSIS — R634 Abnormal weight loss: Secondary | ICD-10-CM

## 2012-05-07 DIAGNOSIS — E538 Deficiency of other specified B group vitamins: Secondary | ICD-10-CM

## 2012-05-07 DIAGNOSIS — Z23 Encounter for immunization: Secondary | ICD-10-CM

## 2012-05-07 DIAGNOSIS — M545 Low back pain, unspecified: Secondary | ICD-10-CM

## 2012-05-07 MED ORDER — MIRTAZAPINE 15 MG PO TABS: 15.0000 mg | ORAL_TABLET | Freq: Every day | ORAL | Status: DC

## 2012-05-07 MED ORDER — TEMAZEPAM 30 MG PO CAPS: 30.0000 mg | ORAL_CAPSULE | Freq: Every evening | ORAL | Status: DC | PRN

## 2012-05-07 MED ORDER — OXYCODONE-ACETAMINOPHEN 10-325 MG PO TABS: ORAL_TABLET | ORAL | Status: DC

## 2012-05-07 NOTE — Assessment & Plan Note (Signed)
Continue with current prescription therapy as reflected on the Med list.  

## 2012-05-07 NOTE — Progress Notes (Signed)
   Subjective:    Patient ID: [name], female    DOB: 11/26/1927, 78 y.o.   MRN: [id number]  HPI   The patient is here to follow up on chronic depression, anxiety, headaches and chronic diarrhea, abd and back pain  symptoms controlled with medicines, diet  C/o UTIs - better C/o leg cramps  Wt Readings from Last 3 Encounters:  05/07/12 100 lb 4 oz (45.473 kg)  03/07/12 100 lb 4 oz (45.473 kg)  12/30/11 104 lb (47.174 kg)   BP Readings from Last 3 Encounters:  05/07/12 110/70  03/07/12 100/70  12/30/11 92/52      Review of Systems  Constitutional: Negative for chills, activity change, appetite change, fatigue and unexpected weight change.  HENT: Negative for hearing loss, congestion, mouth sores and sinus pressure.   Eyes: Negative for visual disturbance.  Respiratory: Negative for cough and chest tightness.   Gastrointestinal: Positive for abdominal pain and diarrhea. Negative for nausea.  Genitourinary: Negative for frequency, difficulty urinating and vaginal pain.  Musculoskeletal: Negative for back pain and gait problem.  Skin: Negative for pallor and rash.  Neurological: Negative for dizziness, tremors, weakness, numbness and headaches.  Psychiatric/Behavioral: Negative for confusion, disturbed wake/sleep cycle and dysphoric mood.        Objective:   Physical Exam  Constitutional: She appears well-developed. No distress.  HENT:  Head: Normocephalic.  Right Ear: External ear normal.  Left Ear: External ear normal.  Nose: Nose normal.  Mouth/Throat: Oropharynx is clear and moist.  Eyes: Conjunctivae normal are normal. Pupils are equal, round, and reactive to light. Right eye exhibits no discharge. Left eye exhibits no discharge.  Neck: Normal range of motion. Neck supple. No JVD present. No tracheal deviation present. No thyromegaly present.  Cardiovascular: Normal rate, regular rhythm and normal heart sounds.   Pulmonary/Chest: No stridor. No respiratory  distress. She has no wheezes.  Abdominal: Soft. Bowel sounds are normal. She exhibits no distension and no mass. There is no tenderness. There is no rebound and no guarding.  Musculoskeletal: She exhibits no edema and no tenderness.  Lymphadenopathy:    She has no cervical adenopathy.  Neurological: She displays normal reflexes. No cranial nerve deficit. She exhibits normal muscle tone. Coordination normal.  Skin: No rash noted. She is not diaphoretic. No erythema.  Psychiatric: She has a normal mood and affect. Her behavior is normal. Judgment and thought content normal.  Colostomy   Lab Results  Component Value Date   WBC 4.8 03/07/2012   HGB 13.4 03/07/2012   HCT 39.9 03/07/2012   PLT 142.0* 03/07/2012   GLUCOSE 92 03/07/2012   CHOL 148 04/29/2010   TRIG 98.0 04/29/2010   HDL 58.50 04/29/2010   LDLCALC 70 04/29/2010   ALT 17 03/07/2012   AST 17 03/07/2012   NA 139 03/07/2012   K 4.9 03/07/2012   CL 108 03/07/2012   CREATININE 1.4* 03/07/2012   BUN 41* 03/07/2012   CO2 23 03/07/2012   TSH 2.41 11/15/2011   INR 1.1 12/07/2007        Assessment & Plan:

## 2012-05-07 NOTE — Assessment & Plan Note (Signed)
Try Gatorade or Powerade in place of water for cramps Try Tylenol PM  Labs ore ok

## 2012-05-07 NOTE — Patient Instructions (Signed)
Try Gatorade or Powerade in place of water for cramps Try Tylenol PM

## 2012-06-21 ENCOUNTER — Ambulatory Visit (INDEPENDENT_AMBULATORY_CARE_PROVIDER_SITE_OTHER): Payer: Medicare Other | Admitting: Internal Medicine

## 2012-06-21 ENCOUNTER — Encounter: Payer: Self-pay | Admitting: Internal Medicine

## 2012-06-21 VITALS — BP 100/60 | HR 80 | Temp 98.8°F | Resp 16 | Wt 98.0 lb

## 2012-06-21 DIAGNOSIS — R634 Abnormal weight loss: Secondary | ICD-10-CM

## 2012-06-21 DIAGNOSIS — N390 Urinary tract infection, site not specified: Secondary | ICD-10-CM

## 2012-06-21 DIAGNOSIS — E538 Deficiency of other specified B group vitamins: Secondary | ICD-10-CM

## 2012-06-21 DIAGNOSIS — R7989 Other specified abnormal findings of blood chemistry: Secondary | ICD-10-CM

## 2012-06-21 DIAGNOSIS — R1084 Generalized abdominal pain: Secondary | ICD-10-CM

## 2012-06-21 DIAGNOSIS — M545 Low back pain, unspecified: Secondary | ICD-10-CM

## 2012-06-21 MED ORDER — OXYCODONE-ACETAMINOPHEN 10-325 MG PO TABS: 1.0000 | ORAL_TABLET | Freq: Four times a day (QID) | ORAL | Status: DC | PRN

## 2012-06-21 NOTE — Assessment & Plan Note (Signed)
Wt Readings from Last 3 Encounters:  06/21/12 98 lb (44.453 kg)  05/07/12 100 lb 4 oz (45.473 kg)  03/07/12 100 lb 4 oz (45.473 kg)

## 2012-06-21 NOTE — Assessment & Plan Note (Signed)
Continue with current prescription therapy as reflected on the Med list.  

## 2012-06-21 NOTE — Assessment & Plan Note (Signed)
Percocet handwritten R was given (Pharmacy request) dated 06/21/12, 07/21/12, 08/21/12 #120 each

## 2012-06-21 NOTE — Assessment & Plan Note (Signed)
Per Urology  

## 2012-06-21 NOTE — Assessment & Plan Note (Signed)
Percocet handwritten R was given (Pharmacy request) dated 06/21/12, 07/21/12, 08/21/12 #120 each Labs next time

## 2012-06-21 NOTE — Assessment & Plan Note (Signed)
Labs

## 2012-06-21 NOTE — Progress Notes (Signed)
   Subjective:    Patient ID: [name], female    DOB: 1928-03-07, 78 y.o.   MRN: [id number]  HPI   The patient is here to follow up on chronic depression, anxiety, headaches and chronic diarrhea, abd and back pain  symptoms controlled with medicines, diet  [facility] F/u UTIs - better F/u leg cramps, fatigue  Wt Readings from Last 3 Encounters:  06/21/12 98 lb (44.453 kg)  05/07/12 100 lb 4 oz (45.473 kg)  03/07/12 100 lb 4 oz (45.473 kg)   BP Readings from Last 3 Encounters:  06/21/12 100/60  05/07/12 110/70  03/07/12 100/70      Review of Systems  Constitutional: Negative for chills, activity change, appetite change, fatigue and unexpected weight change.  HENT: Negative for hearing loss, congestion, mouth sores and sinus pressure.   Eyes: Negative for visual disturbance.  Respiratory: Negative for cough and chest tightness.   Gastrointestinal: Positive for abdominal pain and diarrhea. Negative for nausea.  Genitourinary: Negative for frequency, difficulty urinating and vaginal pain.  Musculoskeletal: Negative for back pain and gait problem.  Skin: Negative for pallor and rash.  Neurological: Negative for dizziness, tremors, weakness, numbness and headaches.  Psychiatric/Behavioral: Negative for confusion, sleep disturbance and dysphoric mood.        Objective:   Physical Exam  Constitutional: She appears well-developed. No distress.  HENT:  Head: Normocephalic.  Right Ear: External ear normal.  Left Ear: External ear normal.  Nose: Nose normal.  Mouth/Throat: Oropharynx is clear and moist.  Eyes: Conjunctivae normal are normal. Pupils are equal, round, and reactive to light. Right eye exhibits no discharge. Left eye exhibits no discharge.  Neck: Normal range of motion. Neck supple. No JVD present. No tracheal deviation present. No thyromegaly present.  Cardiovascular: Normal rate, regular rhythm and normal heart sounds.     Pulmonary/Chest: No stridor. No respiratory distress. She has no wheezes.  Abdominal: Soft. Bowel sounds are normal. She exhibits no distension and no mass. There is no tenderness. There is no rebound and no guarding.  Musculoskeletal: She exhibits no edema and no tenderness.  Lymphadenopathy:    She has no cervical adenopathy.  Neurological: She displays normal reflexes. No cranial nerve deficit. She exhibits normal muscle tone. Coordination normal.  Skin: No rash noted. She is not diaphoretic. No erythema.  Psychiatric: She has a normal mood and affect. Her behavior is normal. Judgment and thought content normal.  Colostomy   Lab Results  Component Value Date   WBC 4.8 03/07/2012   HGB 13.4 03/07/2012   HCT 39.9 03/07/2012   PLT 142.0* 03/07/2012   GLUCOSE 92 03/07/2012   CHOL 148 04/29/2010   TRIG 98.0 04/29/2010   HDL 58.50 04/29/2010   LDLCALC 70 04/29/2010   ALT 17 03/07/2012   AST 17 03/07/2012   NA 139 03/07/2012   K 4.9 03/07/2012   CL 108 03/07/2012   CREATININE 1.4* 03/07/2012   BUN 41* 03/07/2012   CO2 23 03/07/2012   TSH 2.41 11/15/2011   INR 1.1 12/07/2007        Assessment & Plan:

## 2012-07-18 ENCOUNTER — Other Ambulatory Visit: Payer: Self-pay | Admitting: Internal Medicine

## 2012-07-20 ENCOUNTER — Other Ambulatory Visit: Payer: Self-pay | Admitting: *Deleted

## 2012-07-20 NOTE — Telephone Encounter (Signed)
Left msg on triage stating would like md to refill her antibiotic for UTI. Still having urinary sxs.Marland KitchenMarland Kitchen

## 2012-07-20 NOTE — Telephone Encounter (Signed)
Patient call back requesting status on refill on her septra. Dr Posey Rea is this ok to refill?...Raechel Chute

## 2012-07-23 NOTE — Telephone Encounter (Signed)
Pt calling requesting refill of Septra for UTI-please advise if this is okay to refill? Pt is very upset because this is her 3rd call and she has not gotten a response back.

## 2012-07-24 NOTE — Telephone Encounter (Signed)
OK to fill this prescription with additional refills x1 Thank you!  

## 2012-07-25 ENCOUNTER — Telehealth: Payer: Self-pay | Admitting: Internal Medicine

## 2012-07-25 MED ORDER — SULFAMETHOXAZOLE-TMP DS 800-160 MG PO TABS: 1.0000 | ORAL_TABLET | Freq: Two times a day (BID) | ORAL | Status: DC

## 2012-07-25 NOTE — Telephone Encounter (Signed)
Patient Information:  Caller Name: [place]  Phone: [phone]  Patient: [name], [name]  Gender: Female  DOB: 25-May-1928  Age: 78 Years  PCP: Plotnikov, Alex (Adults only)  Office Follow Up:  Does the office need to follow up with this patient?: No  Instructions For The Office: N/A  RN Note:  Per orders in EPIC, [facility]  I verified same and it is ready for patient to pick up.  Symptoms  Reason For Call & Symptoms: Patient calling, unable to get her antibiotic refilled for UTI.  Has bilateral nephrostomy tubes that drain into drainage bags.  Having burning in bladder and urethrea even though she doesn't pass urine.  States that she called 12/13, 12/16 and 12/17.  Reviewed Health History In EMR: Yes  Reviewed Medications In EMR: Yes  Reviewed Allergies In EMR: Yes  Reviewed Surgeries / Procedures: Yes  Date of Onset of Symptoms: 07/20/2012  Guideline(s) Used:  No Protocol Available - Sick Adult  Disposition Per Guideline:   Home Care  Reason For Disposition Reached:   Patient's symptoms are safe to treat at home per nursing judgment  Advice Given:  N/A

## 2012-09-21 ENCOUNTER — Ambulatory Visit: Payer: Medicare Other | Admitting: Internal Medicine

## 2012-09-26 ENCOUNTER — Other Ambulatory Visit (INDEPENDENT_AMBULATORY_CARE_PROVIDER_SITE_OTHER): Payer: Medicare Other

## 2012-09-26 ENCOUNTER — Encounter: Payer: Self-pay | Admitting: Internal Medicine

## 2012-09-26 ENCOUNTER — Ambulatory Visit (INDEPENDENT_AMBULATORY_CARE_PROVIDER_SITE_OTHER): Payer: Medicare Other | Admitting: Internal Medicine

## 2012-09-26 VITALS — BP 120/70 | HR 76 | Temp 96.7°F | Resp 16 | Wt 104.0 lb

## 2012-09-26 DIAGNOSIS — R1084 Generalized abdominal pain: Secondary | ICD-10-CM

## 2012-09-26 DIAGNOSIS — M545 Low back pain, unspecified: Secondary | ICD-10-CM

## 2012-09-26 DIAGNOSIS — R7989 Other specified abnormal findings of blood chemistry: Secondary | ICD-10-CM

## 2012-09-26 DIAGNOSIS — N390 Urinary tract infection, site not specified: Secondary | ICD-10-CM

## 2012-09-26 DIAGNOSIS — E559 Vitamin D deficiency, unspecified: Secondary | ICD-10-CM

## 2012-09-26 DIAGNOSIS — E538 Deficiency of other specified B group vitamins: Secondary | ICD-10-CM

## 2012-09-26 DIAGNOSIS — F329 Major depressive disorder, single episode, unspecified: Secondary | ICD-10-CM

## 2012-09-26 DIAGNOSIS — R634 Abnormal weight loss: Secondary | ICD-10-CM

## 2012-09-26 LAB — CBC WITH DIFFERENTIAL/PLATELET
Basophils Absolute: 0 10*3/uL (ref 0.0–0.1)
Eosinophils Absolute: 0.1 10*3/uL (ref 0.0–0.7)
Hemoglobin: 11.2 g/dL — ABNORMAL LOW (ref 12.0–15.0)
Lymphocytes Relative: 19.8 % (ref 12.0–46.0)
MCHC: 33.2 g/dL (ref 30.0–36.0)
Neutro Abs: 1.9 10*3/uL (ref 1.4–7.7)
Neutrophils Relative %: 65.7 % (ref 43.0–77.0)
Platelets: 115 10*3/uL — ABNORMAL LOW (ref 150.0–400.0)
RDW: 14.8 % — ABNORMAL HIGH (ref 11.5–14.6)

## 2012-09-26 LAB — HEPATIC FUNCTION PANEL
Albumin: 3.5 g/dL (ref 3.5–5.2)
Alkaline Phosphatase: 82 U/L (ref 39–117)

## 2012-09-26 LAB — BASIC METABOLIC PANEL
BUN: 20 mg/dL (ref 6–23)
Calcium: 9.9 mg/dL (ref 8.4–10.5)
Chloride: 113 mEq/L — ABNORMAL HIGH (ref 96–112)
Creatinine, Ser: 1.1 mg/dL (ref 0.4–1.2)
GFR: 49.4 mL/min — ABNORMAL LOW (ref >60.00)

## 2012-09-26 LAB — VITAMIN B12: Vitamin B-12: 694 pg/mL (ref 211–911)

## 2012-09-26 MED ORDER — OXYCODONE-ACETAMINOPHEN 10-325 MG PO TABS: 1.0000 | ORAL_TABLET | Freq: Four times a day (QID) | ORAL | Status: DC | PRN

## 2012-09-26 MED ORDER — TEMAZEPAM 30 MG PO CAPS: 30.0000 mg | ORAL_CAPSULE | Freq: Every evening | ORAL | Status: DC | PRN

## 2012-09-26 MED ORDER — SULFAMETHOXAZOLE-TMP DS 800-160 MG PO TABS: 1.0000 | ORAL_TABLET | Freq: Two times a day (BID) | ORAL | Status: DC

## 2012-09-26 MED ORDER — IBUPROFEN 600 MG PO TABS: 600.0000 mg | ORAL_TABLET | ORAL | Status: DC | PRN

## 2012-09-26 MED ORDER — MIRTAZAPINE 15 MG PO TABS: 15.0000 mg | ORAL_TABLET | Freq: Every day | ORAL | Status: DC

## 2012-09-26 NOTE — Assessment & Plan Note (Signed)
Continue with current prescription therapy as reflected on the Med list.  

## 2012-09-26 NOTE — Progress Notes (Signed)
    Subjective:     HPI   The patient is here to follow up on chronic depression, anxiety, headaches and chronic diarrhea, abd and back pain  symptoms controlled with medicines, diet  [facility] F/u UTIs - better F/u leg cramps, fatigue better  Wt Readings from Last 3 Encounters:  09/26/12 104 lb (47.174 kg)  06/21/12 98 lb (44.453 kg)  05/07/12 100 lb 4 oz (45.473 kg)   BP Readings from Last 3 Encounters:  09/26/12 120/70  06/21/12 100/60  05/07/12 110/70      Review of Systems  Constitutional: Negative for chills, activity change, appetite change, fatigue and unexpected weight change.  HENT: Negative for hearing loss, congestion, mouth sores and sinus pressure.   Eyes: Negative for visual disturbance.  Respiratory: Negative for cough and chest tightness.   Gastrointestinal: Positive for abdominal pain and diarrhea. Negative for nausea.  Genitourinary: Negative for frequency, difficulty urinating and vaginal pain.  Musculoskeletal: Negative for back pain and gait problem.  Skin: Negative for pallor and rash.  Neurological: Negative for dizziness, tremors, weakness, numbness and headaches.  Psychiatric/Behavioral: Negative for confusion, sleep disturbance and dysphoric mood.        Objective:   Physical Exam  Constitutional: She appears well-developed. No distress.  HENT:  Head: Normocephalic.  Right Ear: External ear normal.  Left Ear: External ear normal.  Nose: Nose normal.  Mouth/Throat: Oropharynx is clear and moist.  Eyes: Conjunctivae are normal. Pupils are equal, round, and reactive to light. Right eye exhibits no discharge. Left eye exhibits no discharge.  Neck: Normal range of motion. Neck supple. No JVD present. No tracheal deviation present. No thyromegaly present.  Cardiovascular: Normal rate, regular rhythm and normal heart sounds.   Pulmonary/Chest: No stridor. No respiratory distress. She has no wheezes.  Abdominal: Soft.  Bowel sounds are normal. She exhibits no distension and no mass. There is no tenderness. There is no rebound and no guarding.  Musculoskeletal: She exhibits no edema and no tenderness.  Lymphadenopathy:    She has no cervical adenopathy.  Neurological: She displays normal reflexes. No cranial nerve deficit. She exhibits normal muscle tone. Coordination normal.  Skin: No rash noted. She is not diaphoretic. No erythema.  Psychiatric: She has a normal mood and affect. Her behavior is normal. Judgment and thought content normal.  Colostomy   Lab Results  Component Value Date   WBC 4.8 03/07/2012   HGB 13.4 03/07/2012   HCT 39.9 03/07/2012   PLT 142.0* 03/07/2012   GLUCOSE 92 03/07/2012   CHOL 148 04/29/2010   TRIG 98.0 04/29/2010   HDL 58.50 04/29/2010   LDLCALC 70 04/29/2010   ALT 17 03/07/2012   AST 17 03/07/2012   NA 139 03/07/2012   K 4.9 03/07/2012   CL 108 03/07/2012   CREATININE 1.4* 03/07/2012   BUN 41* 03/07/2012   CO2 23 03/07/2012   TSH 2.41 11/15/2011   INR 1.1 12/07/2007        Assessment & Plan:

## 2012-09-27 ENCOUNTER — Telehealth: Payer: Self-pay | Admitting: Internal Medicine

## 2012-09-27 NOTE — Telephone Encounter (Signed)
[name], please, inform patient that all labs are similar to prior.  Please, mail the labs to the patient.    Thx

## 2012-09-28 NOTE — Telephone Encounter (Signed)
Pt informed/ copies mailed.

## 2012-12-26 ENCOUNTER — Encounter: Payer: Self-pay | Admitting: Internal Medicine

## 2012-12-26 ENCOUNTER — Other Ambulatory Visit (INDEPENDENT_AMBULATORY_CARE_PROVIDER_SITE_OTHER): Payer: Medicare Other

## 2012-12-26 ENCOUNTER — Ambulatory Visit (INDEPENDENT_AMBULATORY_CARE_PROVIDER_SITE_OTHER): Payer: Medicare Other | Admitting: Internal Medicine

## 2012-12-26 VITALS — BP 120/70 | HR 72 | Temp 97.6°F | Resp 16 | Wt 103.0 lb

## 2012-12-26 DIAGNOSIS — D649 Anemia, unspecified: Secondary | ICD-10-CM

## 2012-12-26 DIAGNOSIS — R1084 Generalized abdominal pain: Secondary | ICD-10-CM

## 2012-12-26 DIAGNOSIS — E538 Deficiency of other specified B group vitamins: Secondary | ICD-10-CM

## 2012-12-26 DIAGNOSIS — F3289 Other specified depressive episodes: Secondary | ICD-10-CM

## 2012-12-26 DIAGNOSIS — R7989 Other specified abnormal findings of blood chemistry: Secondary | ICD-10-CM

## 2012-12-26 DIAGNOSIS — M545 Low back pain, unspecified: Secondary | ICD-10-CM

## 2012-12-26 DIAGNOSIS — F329 Major depressive disorder, single episode, unspecified: Secondary | ICD-10-CM

## 2012-12-26 DIAGNOSIS — K9403 Colostomy malfunction: Secondary | ICD-10-CM

## 2012-12-26 DIAGNOSIS — R634 Abnormal weight loss: Secondary | ICD-10-CM

## 2012-12-26 DIAGNOSIS — IMO0002 Reserved for concepts with insufficient information to code with codable children: Secondary | ICD-10-CM

## 2012-12-26 LAB — CBC WITH DIFFERENTIAL/PLATELET
Basophils Absolute: 0 10*3/uL (ref 0.0–0.1)
Eosinophils Relative: 3.4 % (ref 0.0–5.0)
HCT: 37.8 % (ref 36.0–46.0)
Hemoglobin: 12.7 g/dL (ref 12.0–15.0)
Lymphocytes Relative: 16.8 % (ref 12.0–46.0)
Monocytes Relative: 6.9 % (ref 3.0–12.0)
Neutro Abs: 3.4 10*3/uL (ref 1.4–7.7)
RDW: 14.1 % (ref 11.5–14.6)
WBC: 4.6 10*3/uL (ref 4.5–10.5)

## 2012-12-26 LAB — BASIC METABOLIC PANEL
Calcium: 10 mg/dL (ref 8.4–10.5)
GFR: 58.28 mL/min — ABNORMAL LOW (ref >60.00)
Glucose, Bld: 74 mg/dL (ref 70–99)
Potassium: 4 mEq/L (ref 3.5–5.1)
Sodium: 143 mEq/L (ref 135–145)

## 2012-12-26 LAB — HEPATIC FUNCTION PANEL
ALT: 22 U/L (ref 0–35)
AST: 15 U/L (ref 0–37)
Albumin: 3.7 g/dL (ref 3.5–5.2)
Alkaline Phosphatase: 116 U/L (ref 39–117)
Total Bilirubin: 0.5 mg/dL (ref 0.3–1.2)

## 2012-12-26 MED ORDER — OXYCODONE-ACETAMINOPHEN 10-325 MG PO TABS: 1.0000 | ORAL_TABLET | Freq: Four times a day (QID) | ORAL | Status: DC | PRN

## 2012-12-26 NOTE — Progress Notes (Signed)
    Subjective:     HPI   The patient is here to follow up on chronic depression, anxiety, headaches and chronic diarrhea, abd and back pain  symptoms controlled with medicines, diet  [facility] pain Rx F/u UTIs - better F/u leg cramps, fatigue better  Wt Readings from Last 3 Encounters:  12/26/12 103 lb (46.72 kg)  09/26/12 104 lb (47.174 kg)  06/21/12 98 lb (44.453 kg)   BP Readings from Last 3 Encounters:  12/26/12 120/70  09/26/12 120/70  06/21/12 100/60      Review of Systems  Constitutional: Negative for chills, activity change, appetite change, fatigue and unexpected weight change.  HENT: Negative for hearing loss, congestion, mouth sores and sinus pressure.   Eyes: Negative for visual disturbance.  Respiratory: Negative for cough and chest tightness.   Gastrointestinal: Positive for abdominal pain and diarrhea. Negative for nausea.  Genitourinary: Negative for frequency, difficulty urinating and vaginal pain.  Musculoskeletal: Negative for back pain and gait problem.  Skin: Negative for pallor and rash.  Neurological: Negative for dizziness, tremors, weakness, numbness and headaches.  Psychiatric/Behavioral: Negative for confusion, sleep disturbance and dysphoric mood.        Objective:   Physical Exam  Constitutional: She appears well-developed. No distress.  HENT:  Head: Normocephalic.  Right Ear: External ear normal.  Left Ear: External ear normal.  Nose: Nose normal.  Mouth/Throat: Oropharynx is clear and moist.  Eyes: Conjunctivae are normal. Pupils are equal, round, and reactive to light. Right eye exhibits no discharge. Left eye exhibits no discharge.  Neck: Normal range of motion. Neck supple. No JVD present. No tracheal deviation present. No thyromegaly present.  Cardiovascular: Normal rate, regular rhythm and normal heart sounds.   Pulmonary/Chest: No stridor. No respiratory distress. She has no wheezes.  Abdominal: Soft. Bowel  sounds are normal. She exhibits no distension and no mass. There is no tenderness. There is no rebound and no guarding.  Musculoskeletal: She exhibits no edema and no tenderness.  Lymphadenopathy:    She has no cervical adenopathy.  Neurological: She displays normal reflexes. No cranial nerve deficit. She exhibits normal muscle tone. Coordination normal.  Skin: No rash noted. She is not diaphoretic. No erythema.  Psychiatric: She has a normal mood and affect. Her behavior is normal. Judgment and thought content normal.  Colostomy   Lab Results  Component Value Date   WBC 2.8* 09/26/2012   HGB 11.2* 09/26/2012   HCT 33.9* 09/26/2012   PLT 115.0* 09/26/2012   GLUCOSE 79 09/26/2012   CHOL 148 04/29/2010   TRIG 98.0 04/29/2010   HDL 58.50 04/29/2010   LDLCALC 70 04/29/2010   ALT 11 09/26/2012   AST 14 09/26/2012   NA 142 09/26/2012   K 4.3 09/26/2012   CL 113* 09/26/2012   CREATININE 1.1 09/26/2012   BUN 20 09/26/2012   CO2 25 09/26/2012   TSH 2.25 09/26/2012   INR 1.1 12/07/2007        Assessment & Plan:

## 2012-12-26 NOTE — Assessment & Plan Note (Signed)
Watching  

## 2012-12-26 NOTE — Assessment & Plan Note (Signed)
CBC

## 2012-12-26 NOTE — Assessment & Plan Note (Signed)
Better  

## 2012-12-26 NOTE — Assessment & Plan Note (Signed)
Continue with current prescription therapy as reflected on the Med list.  

## 2012-12-26 NOTE — Assessment & Plan Note (Signed)
Wt Readings from Last 3 Encounters:  12/26/12 103 lb (46.72 kg)  09/26/12 104 lb (47.174 kg)  06/21/12 98 lb (44.453 kg)

## 2013-01-30 ENCOUNTER — Telehealth: Payer: Self-pay

## 2013-01-30 NOTE — Telephone Encounter (Signed)
Received request for Mirtazapine as well. Thanks

## 2013-01-30 NOTE — Telephone Encounter (Signed)
Received faxed refill request for Ibuprofen 600 mg for 90 day supply. Pt last seen 12/26/12 and medication last filled 09/26/12 #30/5 rf. Please advise if ok. Thanks

## 2013-01-30 NOTE — Telephone Encounter (Signed)
Ok for refill prn 

## 2013-01-31 ENCOUNTER — Other Ambulatory Visit: Payer: Self-pay | Admitting: *Deleted

## 2013-01-31 MED ORDER — IBUPROFEN 600 MG PO TABS: 600.0000 mg | ORAL_TABLET | ORAL | Status: DC | PRN

## 2013-01-31 MED ORDER — MIRTAZAPINE 15 MG PO TABS: 15.0000 mg | ORAL_TABLET | Freq: Every day | ORAL | Status: DC

## 2013-01-31 NOTE — Telephone Encounter (Signed)
Faxed refill request from [facility] for 90 day supply of mirtazepine.

## 2013-02-18 ENCOUNTER — Telehealth: Payer: Self-pay | Admitting: *Deleted

## 2013-02-18 MED ORDER — SULFAMETHOXAZOLE-TMP DS 800-160 MG PO TABS: 1.0000 | ORAL_TABLET | Freq: Two times a day (BID) | ORAL | Status: DC

## 2013-02-18 NOTE — Telephone Encounter (Signed)
Refilled Bactrim DS Thx

## 2013-02-18 NOTE — Telephone Encounter (Signed)
Pt left c/o UTI. She is requesting abx. Please advise.

## 2013-03-12 ENCOUNTER — Telehealth: Payer: Self-pay | Admitting: *Deleted

## 2013-03-12 NOTE — Telephone Encounter (Signed)
Pt states she has completed her antibiotics for the UTI but she is still having symptoms. She does not have an appointment until 8.20.14.  Please advise

## 2013-03-12 NOTE — Telephone Encounter (Signed)
He should see her Urologist Thx

## 2013-03-13 NOTE — Telephone Encounter (Signed)
Patient called in this morning, informed her of Dr. Loren Racer recommendations

## 2013-03-27 ENCOUNTER — Encounter: Payer: Self-pay | Admitting: Internal Medicine

## 2013-03-27 ENCOUNTER — Other Ambulatory Visit (INDEPENDENT_AMBULATORY_CARE_PROVIDER_SITE_OTHER): Payer: Medicare Other

## 2013-03-27 ENCOUNTER — Ambulatory Visit (INDEPENDENT_AMBULATORY_CARE_PROVIDER_SITE_OTHER): Payer: Medicare Other | Admitting: Internal Medicine

## 2013-03-27 VITALS — BP 118/70 | HR 64 | Temp 97.7°F | Resp 16 | Wt 102.0 lb

## 2013-03-27 DIAGNOSIS — F3289 Other specified depressive episodes: Secondary | ICD-10-CM

## 2013-03-27 DIAGNOSIS — R1084 Generalized abdominal pain: Secondary | ICD-10-CM

## 2013-03-27 DIAGNOSIS — R634 Abnormal weight loss: Secondary | ICD-10-CM

## 2013-03-27 DIAGNOSIS — F329 Major depressive disorder, single episode, unspecified: Secondary | ICD-10-CM

## 2013-03-27 DIAGNOSIS — M545 Low back pain, unspecified: Secondary | ICD-10-CM

## 2013-03-27 DIAGNOSIS — N390 Urinary tract infection, site not specified: Secondary | ICD-10-CM

## 2013-03-27 DIAGNOSIS — M810 Age-related osteoporosis without current pathological fracture: Secondary | ICD-10-CM

## 2013-03-27 DIAGNOSIS — E538 Deficiency of other specified B group vitamins: Secondary | ICD-10-CM

## 2013-03-27 LAB — BASIC METABOLIC PANEL
CO2: 27 mEq/L (ref 19–32)
Calcium: 10 mg/dL (ref 8.4–10.5)
Chloride: 109 mEq/L (ref 96–112)
Glucose, Bld: 77 mg/dL (ref 70–99)
Potassium: 4.6 mEq/L (ref 3.5–5.1)
Sodium: 139 mEq/L (ref 135–145)

## 2013-03-27 MED ORDER — TEMAZEPAM 30 MG PO CAPS: 30.0000 mg | ORAL_CAPSULE | Freq: Every evening | ORAL | Status: DC | PRN

## 2013-03-27 NOTE — Assessment & Plan Note (Signed)
Continue with current prescription therapy as reflected on the Med list.  

## 2013-03-27 NOTE — Progress Notes (Signed)
Patient ID: [name], female   DOB: 1928-04-23, 79 y.o.   MRN: [id number]    Subjective:     HPI   The patient is here to follow up on chronic depression, anxiety, headaches and chronic diarrhea, abd and back pain  symptoms controlled with medicines, diet  [facility] her last pain Rx F/u UTIs - better F/u leg cramps, fatigue better  Wt Readings from Last 3 Encounters:  03/27/13 102 lb (46.267 kg)  12/26/12 103 lb (46.72 kg)  09/26/12 104 lb (47.174 kg)   BP Readings from Last 3 Encounters:  03/27/13 118/70  12/26/12 120/70  09/26/12 120/70      Review of Systems  Constitutional: Negative for chills, activity change, appetite change, fatigue and unexpected weight change.  HENT: Negative for hearing loss, congestion, mouth sores and sinus pressure.   Eyes: Negative for visual disturbance.  Respiratory: Negative for cough and chest tightness.   Gastrointestinal: Positive for abdominal pain and diarrhea. Negative for nausea.  Genitourinary: Negative for frequency, difficulty urinating and vaginal pain.  Musculoskeletal: Negative for back pain and gait problem.  Skin: Negative for pallor and rash.  Neurological: Negative for dizziness, tremors, weakness, numbness and headaches.  Psychiatric/Behavioral: Negative for confusion, sleep disturbance and dysphoric mood.        Objective:   Physical Exam  Constitutional: She appears well-developed. No distress.  HENT:  Head: Normocephalic.  Right Ear: External ear normal.  Left Ear: External ear normal.  Nose: Nose normal.  Mouth/Throat: Oropharynx is clear and moist.  Eyes: Conjunctivae are normal. Pupils are equal, round, and reactive to light. Right eye exhibits no discharge. Left eye exhibits no discharge.  Neck: Normal range of motion. Neck supple. No JVD present. No tracheal deviation present. No thyromegaly present.  Cardiovascular: Normal rate, regular rhythm and normal heart sounds.   Pulmonary/Chest: No  stridor. No respiratory distress. She has no wheezes.  Abdominal: Soft. Bowel sounds are normal. She exhibits no distension and no mass. There is no tenderness. There is no rebound and no guarding.  Musculoskeletal: She exhibits no edema and no tenderness.  Lymphadenopathy:    She has no cervical adenopathy.  Neurological: She displays normal reflexes. No cranial nerve deficit. She exhibits normal muscle tone. Coordination normal.  Skin: No rash noted. She is not diaphoretic. No erythema.  Psychiatric: She has a normal mood and affect. Her behavior is normal. Judgment and thought content normal.  Colostomy   Lab Results  Component Value Date   WBC 4.6 12/26/2012   HGB 12.7 12/26/2012   HCT 37.8 12/26/2012   PLT 133.0* 12/26/2012   GLUCOSE 74 12/26/2012   CHOL 148 04/29/2010   TRIG 98.0 04/29/2010   HDL 58.50 04/29/2010   LDLCALC 70 04/29/2010   ALT 22 12/26/2012   AST 15 12/26/2012   NA 143 12/26/2012   K 4.0 12/26/2012   CL 109 12/26/2012   CREATININE 1.0 12/26/2012   BUN 15 12/26/2012   CO2 28 12/26/2012   TSH 2.25 09/26/2012   INR 1.1 12/07/2007        Assessment & Plan:

## 2013-03-27 NOTE — Assessment & Plan Note (Signed)
Percocet handwritten Rx was given (Pharmacy request) dated 9/18, 10/18, 11/18 #120 each

## 2013-03-27 NOTE — Assessment & Plan Note (Signed)
Treated

## 2013-03-27 NOTE — Assessment & Plan Note (Signed)
Vit D 

## 2013-03-27 NOTE — Assessment & Plan Note (Signed)
Stable  Wt Readings from Last 3 Encounters:  03/27/13 102 lb (46.267 kg)  12/26/12 103 lb (46.72 kg)  09/26/12 104 lb (47.174 kg)

## 2013-05-21 ENCOUNTER — Telehealth (INDEPENDENT_AMBULATORY_CARE_PROVIDER_SITE_OTHER): Payer: Self-pay

## 2013-05-21 NOTE — Telephone Encounter (Signed)
[name] with [facility] is asking for orders to be sign asap for ostomy supplies 3rd request  # [phone] She is re faxing

## 2013-05-23 NOTE — Telephone Encounter (Signed)
[name] with [facility] has called back again asking if the fax had been received.  I do not see it in Dr. Tenna Child box.  Checked fax number which they had the incorrect number for.  I gave the correct fax number and they are going to refax at this time.  Explained I would get it to Meagen who will try to get it signed by another MD.

## 2013-05-27 ENCOUNTER — Telehealth: Payer: Self-pay | Admitting: Internal Medicine

## 2013-05-29 NOTE — Telephone Encounter (Signed)
A user error has taken place.

## 2013-06-26 ENCOUNTER — Ambulatory Visit (INDEPENDENT_AMBULATORY_CARE_PROVIDER_SITE_OTHER): Payer: Medicare (Managed Care) | Admitting: Internal Medicine

## 2013-06-26 ENCOUNTER — Encounter: Payer: Self-pay | Admitting: Internal Medicine

## 2013-06-26 VITALS — BP 120/60 | HR 76 | Temp 98.2°F | Resp 16 | Wt 105.0 lb

## 2013-06-26 DIAGNOSIS — E538 Deficiency of other specified B group vitamins: Secondary | ICD-10-CM

## 2013-06-26 DIAGNOSIS — Z23 Encounter for immunization: Secondary | ICD-10-CM

## 2013-06-26 DIAGNOSIS — R7989 Other specified abnormal findings of blood chemistry: Secondary | ICD-10-CM

## 2013-06-26 DIAGNOSIS — R634 Abnormal weight loss: Secondary | ICD-10-CM

## 2013-06-26 DIAGNOSIS — M545 Low back pain, unspecified: Secondary | ICD-10-CM

## 2013-06-26 DIAGNOSIS — R1084 Generalized abdominal pain: Secondary | ICD-10-CM

## 2013-06-26 DIAGNOSIS — F3289 Other specified depressive episodes: Secondary | ICD-10-CM

## 2013-06-26 DIAGNOSIS — F329 Major depressive disorder, single episode, unspecified: Secondary | ICD-10-CM

## 2013-06-26 NOTE — Assessment & Plan Note (Signed)
Continue with current prescription therapy as reflected on the Med list.  

## 2013-06-26 NOTE — Assessment & Plan Note (Signed)
Continue with current prescription therapy as reflected on the Med list.  Percocet handwritten Rx was given (Pharmacy request) dated 12/18, 1/18, 2/18  #120

## 2013-06-26 NOTE — Progress Notes (Signed)
Pre visit review using our clinic review tool, if applicable. No additional management support is needed unless otherwise documented below in the visit note. 

## 2013-06-26 NOTE — Assessment & Plan Note (Signed)
labs

## 2013-06-26 NOTE — Progress Notes (Signed)
    Subjective:     HPI   The patient is here to follow up on chronic depression, anxiety, headaches and chronic diarrhea, abd and back pain  symptoms controlled with medicines, diet  [facility] not [facility] her last pain Rx F/u UTIs - better F/u leg cramps, fatigue better  Wt Readings from Last 3 Encounters:  06/26/13 105 lb (47.628 kg)  03/27/13 102 lb (46.267 kg)  12/26/12 103 lb (46.72 kg)   BP Readings from Last 3 Encounters:  06/26/13 120/60  03/27/13 118/70  12/26/12 120/70      Review of Systems  Constitutional: Negative for chills, activity change, appetite change, fatigue and unexpected weight change.  HENT: Negative for congestion, hearing loss, mouth sores and sinus pressure.   Eyes: Negative for visual disturbance.  Respiratory: Negative for cough and chest tightness.   Gastrointestinal: Positive for abdominal pain and diarrhea. Negative for nausea.  Genitourinary: Negative for frequency, difficulty urinating and vaginal pain.  Musculoskeletal: Negative for back pain and gait problem.  Skin: Negative for pallor and rash.  Neurological: Negative for dizziness, tremors, weakness, numbness and headaches.  Psychiatric/Behavioral: Negative for confusion, sleep disturbance and dysphoric mood.        Objective:   Physical Exam  Constitutional: She appears well-developed. No distress.  HENT:  Head: Normocephalic.  Right Ear: External ear normal.  Left Ear: External ear normal.  Nose: Nose normal.  Mouth/Throat: Oropharynx is clear and moist.  Eyes: Conjunctivae are normal. Pupils are equal, round, and reactive to light. Right eye exhibits no discharge. Left eye exhibits no discharge.  Neck: Normal range of motion. Neck supple. No JVD present. No tracheal deviation present. No thyromegaly present.  Cardiovascular: Normal rate, regular rhythm and normal heart sounds.   Pulmonary/Chest: No stridor. No respiratory distress. She has no wheezes.  Abdominal: Soft. Bowel  sounds are normal. She exhibits no distension and no mass. There is no tenderness. There is no rebound and no guarding.  Musculoskeletal: She exhibits no edema and no tenderness.  Lymphadenopathy:    She has no cervical adenopathy.  Neurological: She displays normal reflexes. No cranial nerve deficit. She exhibits normal muscle tone. Coordination normal.  Skin: No rash noted. She is not diaphoretic. No erythema.  Psychiatric: She has a normal mood and affect. Her behavior is normal. Judgment and thought content normal.  Colostomy R 3d finger is in a splint - pt declined X ray   Lab Results  Component Value Date   WBC 4.6 12/26/2012   HGB 12.7 12/26/2012   HCT 37.8 12/26/2012   PLT 133.0* 12/26/2012   GLUCOSE 77 03/27/2013   CHOL 148 04/29/2010   TRIG 98.0 04/29/2010   HDL 58.50 04/29/2010   LDLCALC 70 04/29/2010   ALT 22 12/26/2012   AST 15 12/26/2012   NA 139 03/27/2013   K 4.6 03/27/2013   CL 109 03/27/2013   CREATININE 1.0 03/27/2013   BUN 20 03/27/2013   CO2 27 03/27/2013   TSH 2.25 09/26/2012   INR 1.1 12/07/2007        Assessment & Plan:

## 2013-06-26 NOTE — Assessment & Plan Note (Signed)
Wt Readings from Last 3 Encounters:  06/26/13 105 lb (47.628 kg)  03/27/13 102 lb (46.267 kg)  12/26/12 103 lb (46.72 kg)

## 2013-07-19 ENCOUNTER — Other Ambulatory Visit: Payer: Self-pay | Admitting: Internal Medicine

## 2013-09-27 ENCOUNTER — Encounter: Payer: Self-pay | Admitting: Internal Medicine

## 2013-09-27 ENCOUNTER — Other Ambulatory Visit (INDEPENDENT_AMBULATORY_CARE_PROVIDER_SITE_OTHER): Payer: BC Managed Care – PPO

## 2013-09-27 ENCOUNTER — Ambulatory Visit (INDEPENDENT_AMBULATORY_CARE_PROVIDER_SITE_OTHER): Payer: BC Managed Care – PPO | Admitting: Internal Medicine

## 2013-09-27 VITALS — BP 120/60 | HR 80 | Temp 97.1°F | Resp 16 | Wt 107.0 lb

## 2013-09-27 DIAGNOSIS — R1084 Generalized abdominal pain: Secondary | ICD-10-CM

## 2013-09-27 DIAGNOSIS — R634 Abnormal weight loss: Secondary | ICD-10-CM

## 2013-09-27 DIAGNOSIS — E538 Deficiency of other specified B group vitamins: Secondary | ICD-10-CM

## 2013-09-27 DIAGNOSIS — Z23 Encounter for immunization: Secondary | ICD-10-CM

## 2013-09-27 DIAGNOSIS — F3289 Other specified depressive episodes: Secondary | ICD-10-CM

## 2013-09-27 DIAGNOSIS — F329 Major depressive disorder, single episode, unspecified: Secondary | ICD-10-CM

## 2013-09-27 DIAGNOSIS — M545 Low back pain, unspecified: Secondary | ICD-10-CM

## 2013-09-27 LAB — CBC WITH DIFFERENTIAL/PLATELET
BASOS PCT: 0.3 % (ref 0.0–3.0)
Basophils Absolute: 0 10*3/uL (ref 0.0–0.1)
EOS ABS: 0.1 10*3/uL (ref 0.0–0.7)
EOS PCT: 2.6 % (ref 0.0–5.0)
HEMATOCRIT: 38.2 % (ref 36.0–46.0)
Hemoglobin: 12.5 g/dL (ref 12.0–15.0)
LYMPHS ABS: 0.6 10*3/uL — AB (ref 0.7–4.0)
Lymphocytes Relative: 13.4 % (ref 12.0–46.0)
MCHC: 32.7 g/dL (ref 30.0–36.0)
MCV: 93.8 fl (ref 78.0–100.0)
MONO ABS: 0.2 10*3/uL (ref 0.1–1.0)
Monocytes Relative: 5 % (ref 3.0–12.0)
Neutro Abs: 3.4 10*3/uL (ref 1.4–7.7)
Neutrophils Relative %: 78.7 % — ABNORMAL HIGH (ref 43.0–77.0)
Platelets: 169 10*3/uL (ref 150.0–400.0)
RBC: 4.07 Mil/uL (ref 3.87–5.11)
RDW: 14.7 % — ABNORMAL HIGH (ref 11.5–14.6)
WBC: 4.3 10*3/uL — ABNORMAL LOW (ref 4.5–10.5)

## 2013-09-27 LAB — HEPATIC FUNCTION PANEL
ALBUMIN: 3.6 g/dL (ref 3.5–5.2)
ALK PHOS: 103 U/L (ref 39–117)
ALT: 41 U/L — ABNORMAL HIGH (ref 0–35)
AST: 20 U/L (ref 0–37)
Bilirubin, Direct: 0 mg/dL (ref 0.0–0.3)
Total Bilirubin: 0.3 mg/dL (ref 0.3–1.2)
Total Protein: 6.9 g/dL (ref 6.0–8.3)

## 2013-09-27 LAB — BASIC METABOLIC PANEL
BUN: 17 mg/dL (ref 6–23)
CHLORIDE: 109 meq/L (ref 96–112)
CO2: 27 mEq/L (ref 19–32)
Calcium: 10.2 mg/dL (ref 8.4–10.5)
Creatinine, Ser: 1 mg/dL (ref 0.4–1.2)
GFR: 57.49 mL/min — ABNORMAL LOW (ref >60.00)
Glucose, Bld: 86 mg/dL (ref 70–99)
POTASSIUM: 4.3 meq/L (ref 3.5–5.1)
Sodium: 142 mEq/L (ref 135–145)

## 2013-09-27 LAB — TSH: TSH: 2.86 u[IU]/mL (ref 0.35–5.50)

## 2013-09-27 LAB — VITAMIN B12

## 2013-09-27 MED ORDER — TEMAZEPAM 30 MG PO CAPS: 30.0000 mg | ORAL_CAPSULE | Freq: Every evening | ORAL | Status: DC | PRN

## 2013-09-27 MED ORDER — IBUPROFEN 600 MG PO TABS: 600.0000 mg | ORAL_TABLET | Freq: Three times a day (TID) | ORAL | Status: DC | PRN

## 2013-09-27 NOTE — Progress Notes (Signed)
    Subjective:     HPI   The patient is here to follow up on chronic depression, anxiety, headaches and chronic diarrhea, abd and back pain  symptoms controlled with medicines, diet  [facility] did not feel her last pain Rx F/u UTIs - better F/u leg cramps, fatigue better  Wt Readings from Last 3 Encounters:  09/27/13 107 lb (48.535 kg)  06/26/13 105 lb (47.628 kg)  03/27/13 102 lb (46.267 kg)   BP Readings from Last 3 Encounters:  09/27/13 120/60  06/26/13 120/60  03/27/13 118/70      Review of Systems  Constitutional: Negative for chills, activity change, appetite change, fatigue and unexpected weight change.  HENT: Negative for congestion, hearing loss, mouth sores and sinus pressure.   Eyes: Negative for visual disturbance.  Respiratory: Negative for cough and chest tightness.   Gastrointestinal: Positive for abdominal pain and diarrhea. Negative for nausea.  Genitourinary: Negative for frequency, difficulty urinating and vaginal pain.  Musculoskeletal: Negative for back pain and gait problem.  Skin: Negative for pallor and rash.  Neurological: Negative for dizziness, tremors, weakness, numbness and headaches.  Psychiatric/Behavioral: Negative for confusion, sleep disturbance and dysphoric mood.        Objective:   Physical Exam  Constitutional: She appears well-developed. No distress.  HENT:  Head: Normocephalic.  Right Ear: External ear normal.  Left Ear: External ear normal.  Nose: Nose normal.  Mouth/Throat: Oropharynx is clear and moist.  Eyes: Conjunctivae are normal. Pupils are equal, round, and reactive to light. Right eye exhibits no discharge. Left eye exhibits no discharge.  Neck: Normal range of motion. Neck supple. No JVD present. No tracheal deviation present. No thyromegaly present.  Cardiovascular: Normal rate, regular rhythm and normal heart sounds.   Pulmonary/Chest: No stridor. No respiratory distress. She has no wheezes.  Abdominal: Soft. Bowel  sounds are normal. She exhibits no distension and no mass. There is no tenderness. There is no rebound and no guarding.  Musculoskeletal: She exhibits no edema and no tenderness.  Lymphadenopathy:    She has no cervical adenopathy.  Neurological: She displays normal reflexes. No cranial nerve deficit. She exhibits normal muscle tone. Coordination normal.  Skin: No rash noted. She is not diaphoretic. No erythema.  Psychiatric: She has a normal mood and affect. Her behavior is normal. Judgment and thought content normal.  Colostomy R 3d finger is in a splint - pt declined X ray   Lab Results  Component Value Date   WBC 4.6 12/26/2012   HGB 12.7 12/26/2012   HCT 37.8 12/26/2012   PLT 133.0* 12/26/2012   GLUCOSE 77 03/27/2013   CHOL 148 04/29/2010   TRIG 98.0 04/29/2010   HDL 58.50 04/29/2010   LDLCALC 70 04/29/2010   ALT 22 12/26/2012   AST 15 12/26/2012   NA 139 03/27/2013   K 4.6 03/27/2013   CL 109 03/27/2013   CREATININE 1.0 03/27/2013   BUN 20 03/27/2013   CO2 27 03/27/2013   TSH 2.25 09/26/2012   INR 1.1 12/07/2007        Assessment & Plan:

## 2013-09-27 NOTE — Assessment & Plan Note (Signed)
Continue with current prescription therapy as reflected on the Med list.  

## 2013-09-27 NOTE — Progress Notes (Signed)
Pre visit review using our clinic review tool, if applicable. No additional management support is needed unless otherwise documented below in the visit note. 

## 2013-09-27 NOTE — Assessment & Plan Note (Addendum)
Continue with current prescription therapy as reflected on the Med list. First Rx out of 3 is 10/23/13

## 2013-09-30 ENCOUNTER — Telehealth: Payer: Self-pay | Admitting: *Deleted

## 2013-09-30 DIAGNOSIS — N3020 Other chronic cystitis without hematuria: Secondary | ICD-10-CM

## 2013-09-30 NOTE — Telephone Encounter (Signed)
I had a call from Radiology - poss vesi-vaginal fistula. Needs a Urol f/u. Will arrange.

## 2013-09-30 NOTE — Telephone Encounter (Signed)
Patient phoned wanting to make sure that her radiologist had contacted and communicated with PCP.  CB# [phone]

## 2013-10-17 ENCOUNTER — Other Ambulatory Visit: Payer: Self-pay | Admitting: Internal Medicine

## 2013-12-25 ENCOUNTER — Encounter: Payer: Self-pay | Admitting: Internal Medicine

## 2013-12-25 ENCOUNTER — Ambulatory Visit (INDEPENDENT_AMBULATORY_CARE_PROVIDER_SITE_OTHER): Payer: BC Managed Care – PPO | Admitting: Internal Medicine

## 2013-12-25 VITALS — BP 114/80 | HR 76 | Temp 97.5°F | Resp 16 | Wt 103.0 lb

## 2013-12-25 DIAGNOSIS — M545 Low back pain, unspecified: Secondary | ICD-10-CM

## 2013-12-25 DIAGNOSIS — R1084 Generalized abdominal pain: Secondary | ICD-10-CM

## 2013-12-25 DIAGNOSIS — F3289 Other specified depressive episodes: Secondary | ICD-10-CM

## 2013-12-25 DIAGNOSIS — F329 Major depressive disorder, single episode, unspecified: Secondary | ICD-10-CM

## 2013-12-25 DIAGNOSIS — E538 Deficiency of other specified B group vitamins: Secondary | ICD-10-CM

## 2013-12-25 DIAGNOSIS — E559 Vitamin D deficiency, unspecified: Secondary | ICD-10-CM

## 2013-12-25 MED ORDER — MIRTAZAPINE 15 MG PO TABS: 15.0000 mg | ORAL_TABLET | Freq: Every day | ORAL | Status: DC

## 2013-12-25 NOTE — Assessment & Plan Note (Signed)
Continue with current prescription therapy as reflected on the Med list.  

## 2013-12-25 NOTE — Progress Notes (Signed)
    Subjective:     HPI   The patient is here to follow up on chronic depression, anxiety, headaches and chronic diarrhea, abd and back pain  symptoms controlled with medicines, diet  [facility] did not feel her last pain Rx F/u UTIs - better F/u leg cramps, fatigue better  Wt Readings from Last 3 Encounters:  12/25/13 103 lb (46.72 kg)  09/27/13 107 lb (48.535 kg)  06/26/13 105 lb (47.628 kg)   BP Readings from Last 3 Encounters:  12/25/13 114/80  09/27/13 120/60  06/26/13 120/60      Review of Systems  Constitutional: Negative for chills, activity change, appetite change, fatigue and unexpected weight change.  HENT: Negative for congestion, hearing loss, mouth sores and sinus pressure.   Eyes: Negative for visual disturbance.  Respiratory: Negative for cough and chest tightness.   Gastrointestinal: Positive for abdominal pain and diarrhea. Negative for nausea.  Genitourinary: Negative for frequency, difficulty urinating and vaginal pain.  Musculoskeletal: Negative for back pain and gait problem.  Skin: Negative for pallor and rash.  Neurological: Negative for dizziness, tremors, weakness, numbness and headaches.  Psychiatric/Behavioral: Negative for confusion, sleep disturbance and dysphoric mood.        Objective:   Physical Exam  Constitutional: She appears well-developed. No distress.  HENT:  Head: Normocephalic.  Right Ear: External ear normal.  Left Ear: External ear normal.  Nose: Nose normal.  Mouth/Throat: Oropharynx is clear and moist.  Eyes: Conjunctivae are normal. Pupils are equal, round, and reactive to light. Right eye exhibits no discharge. Left eye exhibits no discharge.  Neck: Normal range of motion. Neck supple. No JVD present. No tracheal deviation present. No thyromegaly present.  Cardiovascular: Normal rate, regular rhythm and normal heart sounds.   Pulmonary/Chest: No stridor. No respiratory distress. She has no wheezes.  Abdominal: Soft. Bowel  sounds are normal. She exhibits no distension and no mass. There is no tenderness. There is no rebound and no guarding.  Musculoskeletal: She exhibits no edema and no tenderness.  Lymphadenopathy:    She has no cervical adenopathy.  Neurological: She displays normal reflexes. No cranial nerve deficit. She exhibits normal muscle tone. Coordination normal.  Skin: No rash noted. She is not diaphoretic. No erythema.  Psychiatric: She has a normal mood and affect. Her behavior is normal. Judgment and thought content normal.  Colostomy, Urostomy    Lab Results  Component Value Date   WBC 4.3* 09/27/2013   HGB 12.5 09/27/2013   HCT 38.2 09/27/2013   PLT 169.0 09/27/2013   GLUCOSE 86 09/27/2013   CHOL 148 04/29/2010   TRIG 98.0 04/29/2010   HDL 58.50 04/29/2010   LDLCALC 70 04/29/2010   ALT 41* 09/27/2013   AST 20 09/27/2013   NA 142 09/27/2013   K 4.3 09/27/2013   CL 109 09/27/2013   CREATININE 1.0 09/27/2013   BUN 17 09/27/2013   CO2 27 09/27/2013   TSH 2.86 09/27/2013   INR 1.1 12/07/2007        Assessment & Plan:

## 2013-12-25 NOTE — Progress Notes (Signed)
Pre visit review using our clinic review tool, if applicable. No additional management support is needed unless otherwise documented below in the visit note. 

## 2013-12-25 NOTE — Assessment & Plan Note (Signed)
Continue with current prescription therapy as reflected on the Med list.  Percocet handwritten Rx was given (Pharmacy request)  #120 each x 3 mo

## 2014-02-14 ENCOUNTER — Other Ambulatory Visit (HOSPITAL_COMMUNITY): Payer: Self-pay | Admitting: Interventional Radiology

## 2014-02-14 DIAGNOSIS — R32 Unspecified urinary incontinence: Secondary | ICD-10-CM

## 2014-02-14 DIAGNOSIS — N390 Urinary tract infection, site not specified: Secondary | ICD-10-CM

## 2014-02-25 ENCOUNTER — Encounter (HOSPITAL_COMMUNITY): Payer: Self-pay | Admitting: Pharmacy Technician

## 2014-03-03 ENCOUNTER — Other Ambulatory Visit: Payer: Self-pay | Admitting: Radiology

## 2014-03-04 ENCOUNTER — Other Ambulatory Visit: Payer: Self-pay | Admitting: Radiology

## 2014-03-05 ENCOUNTER — Other Ambulatory Visit (HOSPITAL_COMMUNITY): Payer: Self-pay | Admitting: Interventional Radiology

## 2014-03-05 ENCOUNTER — Ambulatory Visit (HOSPITAL_COMMUNITY)
Admission: RE | Admit: 2014-03-05 | Discharge: 2014-03-05 | Disposition: A | Discharge status: Home / Self Care | Payer: MEDICARE | Source: Ambulatory Visit | Attending: Interventional Radiology | Admitting: Interventional Radiology

## 2014-03-05 DIAGNOSIS — N390 Urinary tract infection, site not specified: Secondary | ICD-10-CM

## 2014-03-05 DIAGNOSIS — Z8541 Personal history of malignant neoplasm of cervix uteri: Secondary | ICD-10-CM | POA: Diagnosis not present

## 2014-03-05 DIAGNOSIS — Z923 Personal history of irradiation: Secondary | ICD-10-CM | POA: Insufficient documentation

## 2014-03-05 DIAGNOSIS — R32 Unspecified urinary incontinence: Secondary | ICD-10-CM | POA: Insufficient documentation

## 2014-03-05 DIAGNOSIS — Z936 Other artificial openings of urinary tract status: Secondary | ICD-10-CM | POA: Diagnosis not present

## 2014-03-05 DIAGNOSIS — F17200 Nicotine dependence, unspecified, uncomplicated: Secondary | ICD-10-CM | POA: Diagnosis not present

## 2014-03-05 DIAGNOSIS — Z9079 Acquired absence of other genital organ(s): Secondary | ICD-10-CM | POA: Diagnosis not present

## 2014-03-05 DIAGNOSIS — Z85038 Personal history of other malignant neoplasm of large intestine: Secondary | ICD-10-CM | POA: Insufficient documentation

## 2014-03-05 DIAGNOSIS — Z9049 Acquired absence of other specified parts of digestive tract: Secondary | ICD-10-CM | POA: Insufficient documentation

## 2014-03-05 LAB — CBC WITH DIFFERENTIAL/PLATELET
BASOS ABS: 0 10*3/uL (ref 0.0–0.1)
Basophils Relative: 0 % (ref 0–1)
Eosinophils Absolute: 0.1 10*3/uL (ref 0.0–0.7)
Eosinophils Relative: 2 % (ref 0–5)
HEMATOCRIT: 41.1 % (ref 36.0–46.0)
Hemoglobin: 13.1 g/dL (ref 12.0–15.0)
LYMPHS ABS: 0.6 10*3/uL — AB (ref 0.7–4.0)
Lymphocytes Relative: 16 % (ref 12–46)
MCH: 30.6 pg (ref 26.0–34.0)
MCHC: 31.9 g/dL (ref 30.0–36.0)
MCV: 96 fL (ref 78.0–100.0)
Monocytes Absolute: 0.2 10*3/uL (ref 0.1–1.0)
Monocytes Relative: 6 % (ref 3–12)
NEUTROS ABS: 3.1 10*3/uL (ref 1.7–7.7)
Neutrophils Relative %: 76 % (ref 43–77)
PLATELETS: 142 10*3/uL — AB (ref 150–400)
RBC: 4.28 MIL/uL (ref 3.87–5.11)
RDW: 14.4 % (ref 11.5–15.5)
WBC: 4 10*3/uL (ref 4.0–10.5)

## 2014-03-05 LAB — BASIC METABOLIC PANEL
Anion gap: 15 (ref 5–15)
BUN: 33 mg/dL — ABNORMAL HIGH (ref 6–23)
CO2: 22 meq/L (ref 19–32)
Calcium: 10.4 mg/dL (ref 8.4–10.5)
Chloride: 104 mEq/L (ref 96–112)
Creatinine, Ser: 1.4 mg/dL — ABNORMAL HIGH (ref 0.50–1.10)
GFR calc Af Amer: 39 mL/min — ABNORMAL LOW (ref >90)
GFR, EST NON AFRICAN AMERICAN: 33 mL/min — AB (ref >90)
GLUCOSE: 90 mg/dL (ref 70–99)
Potassium: 4.3 mEq/L (ref 3.7–5.3)
SODIUM: 141 meq/L (ref 137–147)

## 2014-03-05 LAB — APTT: aPTT: 21 seconds — ABNORMAL LOW (ref 24–37)

## 2014-03-05 LAB — PROTIME-INR
INR: 0.96 (ref 0.00–1.49)
PROTHROMBIN TIME: 12.8 s (ref 11.6–15.2)

## 2014-03-05 MED ORDER — IOHEXOL 300 MG/ML  SOLN
100.0000 mL | Freq: Once | INTRAMUSCULAR | Status: AC | PRN
Start: 2014-03-05 — End: 2014-03-05
  Administered 2014-03-05: 50 mL via INTRAVENOUS

## 2014-03-05 MED ORDER — FENTANYL CITRATE 0.05 MG/ML IJ SOLN
INTRAMUSCULAR | Status: AC
  Filled 2014-03-05: qty 2

## 2014-03-05 MED ORDER — DEXTROSE 5 % IV SOLN
1.0000 g | Freq: Once | INTRAVENOUS | Status: AC
  Administered 2014-03-05: 1 g via INTRAVENOUS
  Filled 2014-03-05: qty 10

## 2014-03-05 MED ORDER — FENTANYL CITRATE 0.05 MG/ML IJ SOLN
INTRAMUSCULAR | Status: AC | PRN
  Administered 2014-03-05 (×5): 25 ug via INTRAVENOUS

## 2014-03-05 MED ORDER — SODIUM CHLORIDE 0.9 % IV SOLN: INTRAVENOUS | Status: DC

## 2014-03-05 MED ORDER — MIDAZOLAM HCL 2 MG/2ML IJ SOLN
INTRAMUSCULAR | Status: AC | PRN
  Administered 2014-03-05: 1 mg via INTRAVENOUS
  Administered 2014-03-05: 0.5 mg via INTRAVENOUS
  Administered 2014-03-05: 1 mg via INTRAVENOUS
  Administered 2014-03-05: 0.5 mg via INTRAVENOUS

## 2014-03-05 MED ORDER — MIDAZOLAM HCL 2 MG/2ML IJ SOLN
INTRAMUSCULAR | Status: AC
  Filled 2014-03-05: qty 4

## 2014-03-05 MED ORDER — HYDROCODONE-ACETAMINOPHEN 5-325 MG PO TABS: 1.0000 | ORAL_TABLET | ORAL | Status: DC | PRN

## 2014-03-05 NOTE — Discharge Instructions (Signed)
Embolization, Care After Refer to this sheet in the next few weeks. These instructions provide you with information on caring for yourself after your procedure. Your health care provider may also give you more specific instructions. Your treatment has been planned according to current medical practices, but problems sometimes occur. Call your health care provider if you have any problems or questions after your procedure. WHAT TO EXPECT AFTER THE PROCEDURE After your procedure, it is typical to have the following:   Pain, swelling, or bruising at the puncture site.  Headache.  Loss of appetite, nausea, or vomiting.  Fever. HOME CARE INSTRUCTIONS   Take medicine only as directed by your health care provider.  You can eat what you usually do, but be sure to include lots of fruits, vegetables, and whole grains in your diet. This helps prevent constipation.  Drink enough fluids to keep your urine clear or pale yellow.  Take two 10-minute walks each day.  Do not lift anything heavier than 10 pounds for 1 week after the procedure.  Do not drive for 24 hours after the procedure.  Do not take a bath for 5 days after the procedure. It is okay to take a shower.  You may be able to get back to all your normal activities within 1 week after your procedure. Ask your health care provider when you can return to sexual activity.  Keep all your follow-up appointments. SEEK MEDICAL CARE IF:  Your pain medicine is not helping.  You have a fever.  You have nausea or vomiting.  You have new bruising or swelling in your groin. SEEK IMMEDIATE MEDICAL CARE IF:  You have a fever or persistent symptoms for longer than 3 days.  You have pain in your legs.  You develop swelling and discoloration in your legs.  Your legs become pale and cold or blue.  You develop shortness of breath, feel faint, or pass out.  You have chest pain or trouble breathing.  You develop a cough or cough up  blood.  You develop a rash.  You have side effects from your medicine.  You feel weak or have trouble moving your arms or legs.  You have balance problems.  You have speech or vision problems. Document Released: 07/30/2013 Document Reviewed: 07/30/2013 [facility] Patient Information 2015 [place], [place]. This information is not intended to replace advice given to you by your health care provider. Make sure you discuss any questions you have with your health care provider.

## 2014-03-05 NOTE — H&P (Signed)
[name] is an 80 y.o. female.   Chief Complaint: "I'm leaking urine" HPI: Patient with prior history of endometrial carcinoma treated with external beam radiation and complicated by bilateral distal ureteral strictures requiring percutaneous nephrostomies. Her ureters have since recanalized. She is experiencing chronic urinary incontinence/cystitis/bladder stones. Pt also has hx of colon carcinoma with prior resection/ostomy formation and subsequent colovesical fistula. She presents today following initial consultation with Dr. Laurence Ferrari for bilateral ureteral embolization.  Past Medical History  Diagnosis Date  . History of colon cancer   . Depression   . Osteoporosis     Pt declined Rx  . Anemia   . LBP (low back pain)   . History of nephrolithiasis   . Ureteral stenosis   . Vitamin D deficiency   . Vitamin B12 deficiency   . Endometrial cancer   . Anal fistula   . Colon polyp   . Small bowel obstruction   . Radiation fibrosis     pelvic  . Malnutrition   . Fistula     vesicocolonic  . Colon perforation     Past Surgical History  Procedure Laterality Date  . Abdominal hysterectomy    . Low anterior bowel resection  07/09/1988    For sigmoid cancer - Dr Margot Chimes  . Nephrostomy  2008    bilateral- Dr. Jules Schick  . Urinary diversion    . Tonsillectomy    . Abcess drainage    . Iliostomy  2004    Done due to perf cecum at [facility] in [place]  . Iliostomy revision  09/18/2002    for stenosis - Dr Margot Chimes  . Colostomy  07/12/2006    for colovesicle fistula - Dr Margot Chimes  . Colostomy takedown  07/17/2006    Dr Margot Chimes  . Colostomy takedown  01/13/2005    closed iliostomy -Dr Margot Chimes    Family History  Problem Relation Age of Onset  . Hypertension Other   . Kidney disease Other   . Heart disease Father    Social History:  reports that she has been smoking.  She has never used smokeless tobacco. She reports that she drinks alcohol. She reports that she does  not use illicit drugs.  Allergies:  Allergies  Allergen Reactions  . Ciprofloxacin Other (See Comments)    Stomach   . Flagyl [Metronidazole Hcl]     nausea  . Gabapentin   . Raloxifene   . Venlafaxine     Current outpatient prescriptions:Cholecalciferol (VITAMIN D3) 1000 UNITS tablet, Take 1,000-2,000 Units by mouth 2 (two) times daily. 1,000 units at lunch and 2,000 units at dinner, Disp: , Rfl: ;  Cyanocobalamin (VITAMIN B-12 PO), Place 3,000 mcg under the tongue daily., Disp: , Rfl: ;  ibuprofen (ADVIL,MOTRIN) 600 MG tablet, Take 600 mg by mouth daily as needed (pain)., Disp: , Rfl:  mirtazapine (REMERON) 15 MG tablet, Take 15 mg by mouth at bedtime., Disp: , Rfl: ;  oxyCODONE-acetaminophen (PERCOCET) 10-325 MG per tablet, Take 1 tablet by mouth every 6 (six) hours as needed for pain., Disp: , Rfl: ;  polyethylene glycol (MIRALAX / GLYCOLAX) packet, Take 8.5 g by mouth 3 (three) times a week. No specific days, Disp: , Rfl:  Probiotic Product (ALIGN) 4 MG CAPS, Take 4 mg by mouth daily. For intestinal flora restoration, Disp: , Rfl: ;  temazepam (RESTORIL) 30 MG capsule, Take 30 mg by mouth at bedtime as needed for sleep., Disp: , Rfl:  Current facility-administered medications:0.9 %  sodium chloride  infusion, , Intravenous, Continuous, Ascencion Dike, PA-C;  cefTRIAXone (ROCEPHIN) 1 g in dextrose 5 % 50 mL IVPB, 1 g, Intravenous, Once, Ascencion Dike, PA-C  Results for orders placed in visit on 09/27/13  HEPATIC FUNCTION PANEL      Result Value Ref Range   Total Bilirubin 0.3  0.3 - 1.2 mg/dL   Bilirubin, Direct 0.0  0.0 - 0.3 mg/dL   Alkaline Phosphatase 103  39 - 117 U/L   AST 20  0 - 37 U/L   ALT 41 (*) 0 - 35 U/L   Total Protein 6.9  6.0 - 8.3 g/dL   Albumin 3.6  3.5 - 5.2 g/dL  BASIC METABOLIC PANEL      Result Value Ref Range   Sodium 142  135 - 145 mEq/L   Potassium 4.3  3.5 - 5.1 mEq/L   Chloride 109  96 - 112 mEq/L   CO2 27  19 - 32 mEq/L   Glucose, Bld 86  70 - 99 mg/dL    BUN 17  6 - 23 mg/dL   Creatinine, Ser 1.0  0.4 - 1.2 mg/dL   Calcium 10.2  8.4 - 10.5 mg/dL   GFR 57.49 (*) >60.00 mL/min  CBC WITH DIFFERENTIAL      Result Value Ref Range   WBC 4.3 (*) 4.5 - 10.5 K/uL   RBC 4.07  3.87 - 5.11 Mil/uL   Hemoglobin 12.5  12.0 - 15.0 g/dL   HCT 38.2  36.0 - 46.0 %   MCV 93.8  78.0 - 100.0 fl   MCHC 32.7  30.0 - 36.0 g/dL   RDW 14.7 (*) 11.5 - 14.6 %   Platelets 169.0  150.0 - 400.0 K/uL   Neutrophils Relative % 78.7 (*) 43.0 - 77.0 %   Lymphocytes Relative 13.4  12.0 - 46.0 %   Monocytes Relative 5.0  3.0 - 12.0 %   Eosinophils Relative 2.6  0.0 - 5.0 %   Basophils Relative 0.3  0.0 - 3.0 %   Neutro Abs 3.4  1.4 - 7.7 K/uL   Lymphs Abs 0.6 (*) 0.7 - 4.0 K/uL   Monocytes Absolute 0.2  0.1 - 1.0 K/uL   Eosinophils Absolute 0.1  0.0 - 0.7 K/uL   Basophils Absolute 0.0  0.0 - 0.1 K/uL  VITAMIN B12      Result Value Ref Range   Vitamin B-12 >1500 (*) 211 - 911 pg/mL  TSH      Result Value Ref Range   TSH 2.86  0.35 - 5.50 uIU/mL    Review of Systems  Constitutional: Negative for fever and chills.  Respiratory: Negative for cough, hemoptysis and shortness of breath.   Cardiovascular: Negative for chest pain.  Gastrointestinal: Positive for abdominal pain. Negative for nausea, vomiting and blood in stool.  Genitourinary: Positive for frequency and flank pain. Negative for hematuria.       Chronic urinary incontinence  Musculoskeletal: Positive for back pain.  Neurological: Negative for headaches.  Endo/Heme/Allergies: Does not bruise/bleed easily.    Blood pressure 122/54, pulse 77, temperature 97.8 F (36.6 C), temperature source Oral, resp. rate 18, height 5\' 4"  (1.626 m), weight 103 lb (46.72 kg), SpO2 100.00%. Physical Exam  Constitutional: She is oriented to person, place, and time. She appears well-developed and well-nourished.  Cardiovascular: Normal rate and regular rhythm.   Respiratory: Effort normal.  Distant BS bilat  GI:  Soft. Bowel sounds are normal.  LLQ ostomy intact, site mildly tender; bilat PCN's  intact draining yellow urine  Musculoskeletal: Normal range of motion. She exhibits no edema.  Neurological: She is alert and oriented to person, place, and time.     Assessment/Plan Patient with prior history of endometrial carcinoma treated with external beam radiation and complicated by bilateral distal ureteral strictures requiring percutaneous nephrostomies. Her ureters have since recanalized. She is experiencing chronic urinary incontinence/cystitis/bladder stones. Pt also has hx of colon carcinoma with prior resection/ostomy formation and subsequent colovesical fistula. She presents today following initial consultation with Dr. Laurence Ferrari for bilateral ureteral embolization. Details/risks of procedure d/w pt with her understanding and consent.   ALLRED,D KEVIN 03/05/2014, 12:32 PM

## 2014-03-05 NOTE — Procedures (Signed)
Interventional Radiology Procedure Note  Procedure: 1.) Bilateral antegrade nephrostograms  2.) Embolization of the left ureter 3.) Embolization of the right ureter 4.) Exchange of bilateral PCN tubes Complications: None immediate Recommendations: - Bedrest x 1 hr - D/C home - Abx x 7 days  Signed,  Criselda Peaches, MD [facility]

## 2014-03-05 NOTE — Progress Notes (Signed)
In and out cath 10 french inserted w/o diffculty got 60 cc bld tinged urine cath removed intact.

## 2014-03-28 ENCOUNTER — Ambulatory Visit (INDEPENDENT_AMBULATORY_CARE_PROVIDER_SITE_OTHER): Payer: BC Managed Care – PPO | Admitting: Internal Medicine

## 2014-03-28 ENCOUNTER — Encounter: Payer: Self-pay | Admitting: Internal Medicine

## 2014-03-28 VITALS — BP 88/50 | HR 78 | Temp 97.7°F | Resp 12 | Wt 98.0 lb

## 2014-03-28 DIAGNOSIS — M545 Low back pain, unspecified: Secondary | ICD-10-CM

## 2014-03-28 DIAGNOSIS — R634 Abnormal weight loss: Secondary | ICD-10-CM

## 2014-03-28 DIAGNOSIS — E538 Deficiency of other specified B group vitamins: Secondary | ICD-10-CM

## 2014-03-28 DIAGNOSIS — N135 Crossing vessel and stricture of ureter without hydronephrosis: Secondary | ICD-10-CM

## 2014-03-28 DIAGNOSIS — Z23 Encounter for immunization: Secondary | ICD-10-CM

## 2014-03-28 DIAGNOSIS — N390 Urinary tract infection, site not specified: Secondary | ICD-10-CM

## 2014-03-28 DIAGNOSIS — R1084 Generalized abdominal pain: Secondary | ICD-10-CM

## 2014-03-28 MED ORDER — TEMAZEPAM 30 MG PO CAPS: 30.0000 mg | ORAL_CAPSULE | Freq: Every evening | ORAL | Status: DC | PRN

## 2014-03-28 MED ORDER — MIRTAZAPINE 15 MG PO TABS: 15.0000 mg | ORAL_TABLET | Freq: Every day | ORAL | Status: DC

## 2014-03-28 NOTE — Assessment & Plan Note (Signed)
Continue with current prescription therapy as reflected on the Med list.  

## 2014-03-28 NOTE — Progress Notes (Signed)
Pre visit review using our clinic review tool, if applicable. No additional management support is needed unless otherwise documented below in the visit note. 

## 2014-03-28 NOTE — Progress Notes (Signed)
    Subjective:     HPI   The patient is here to follow up on chronic depression, anxiety, headaches and chronic diarrhea, abd and back pain  symptoms controlled with medicines, diet  [facility] did not feel her last pain Rx F/u UTIs - better F/u leg cramps, fatigue better 03/05/14: s/p bilateral ureteral embolization    Wt Readings from Last 3 Encounters:  03/28/14 98 lb (44.453 kg)  03/05/14 103 lb (46.72 kg)  12/25/13 103 lb (46.72 kg)   BP Readings from Last 3 Encounters:  03/28/14 88/50  03/05/14 111/62  12/25/13 114/80      Review of Systems  Constitutional: Negative for chills, activity change, appetite change, fatigue and unexpected weight change.  HENT: Negative for congestion, hearing loss, mouth sores and sinus pressure.   Eyes: Negative for visual disturbance.  Respiratory: Negative for cough and chest tightness.   Gastrointestinal: Positive for abdominal pain and diarrhea. Negative for nausea.  Genitourinary: Negative for frequency, difficulty urinating and vaginal pain.  Musculoskeletal: Negative for back pain and gait problem.  Skin: Negative for pallor and rash.  Neurological: Negative for dizziness, tremors, weakness, numbness and headaches.  Psychiatric/Behavioral: Negative for confusion, sleep disturbance and dysphoric mood.        Objective:   Physical Exam  Constitutional: She appears well-developed. No distress.  HENT:  Head: Normocephalic.  Right Ear: External ear normal.  Left Ear: External ear normal.  Nose: Nose normal.  Mouth/Throat: Oropharynx is clear and moist.  Eyes: Conjunctivae are normal. Pupils are equal, round, and reactive to light. Right eye exhibits no discharge. Left eye exhibits no discharge.  Neck: Normal range of motion. Neck supple. No JVD present. No tracheal deviation present. No thyromegaly present.  Cardiovascular: Normal rate, regular rhythm and normal heart sounds.   Pulmonary/Chest: No stridor. No respiratory distress.  She has no wheezes.  Abdominal: Soft. Bowel sounds are normal. She exhibits no distension and no mass. There is no tenderness. There is no rebound and no guarding.  Musculoskeletal: She exhibits no edema and no tenderness.  Lymphadenopathy:    She has no cervical adenopathy.  Neurological: She displays normal reflexes. No cranial nerve deficit. She exhibits normal muscle tone. Coordination normal.  Skin: No rash noted. She is not diaphoretic. No erythema.  Psychiatric: She has a normal mood and affect. Her behavior is normal. Judgment and thought content normal.  Colostomy, Urostomy    Lab Results  Component Value Date   WBC 4.0 03/05/2014   HGB 13.1 03/05/2014   HCT 41.1 03/05/2014   PLT 142* 03/05/2014   GLUCOSE 90 03/05/2014   CHOL 148 04/29/2010   TRIG 98.0 04/29/2010   HDL 58.50 04/29/2010   LDLCALC 70 04/29/2010   ALT 41* 09/27/2013   AST 20 09/27/2013   NA 141 03/05/2014   K 4.3 03/05/2014   CL 104 03/05/2014   CREATININE 1.40* 03/05/2014   BUN 33* 03/05/2014   CO2 22 03/05/2014   TSH 2.86 09/27/2013   INR 0.96 03/05/2014        Assessment & Plan:

## 2014-03-28 NOTE — Assessment & Plan Note (Signed)
Percocet handwritten Rx was given (Pharmacy request)  #120 each x3

## 2014-03-28 NOTE — Assessment & Plan Note (Signed)
03/05/14: s/p bilateral ureteral embolization

## 2014-03-28 NOTE — Assessment & Plan Note (Signed)
Wt Readings from Last 3 Encounters:  03/28/14 98 lb (44.453 kg)  03/05/14 103 lb (46.72 kg)  12/25/13 103 lb (46.72 kg)

## 2014-03-31 ENCOUNTER — Other Ambulatory Visit: Payer: Self-pay | Admitting: Urology

## 2014-04-04 ENCOUNTER — Encounter (HOSPITAL_COMMUNITY): Payer: Self-pay | Admitting: Pharmacy Technician

## 2014-04-08 NOTE — Patient Instructions (Signed)
[name]  04/08/2014   Your procedure is scheduled on: 04/17/2014     Report to [facility].  Follow the Signs to [place] at   0600     am  Call this number if you have problems the morning of surgery: [phone]   Remember:   Do not eat food or drink liquids after midnight.   Take these medicines the morning of surgery with A SIP OF WATER:    Do not wear jewelry, make-up or nail polish.  Do not wear lotions, powders, or perfumes. , deodorant    Do not shave 48 hours prior to surgery.   Do not bring valuables to the hospital.  Contacts, dentures or bridgework may not be worn into surgery.     Patients discharged the day of surgery will not be allowed to drive  home.  Name and phone number of your driver:       Please read over the following fact sheets that you were given: [facility] - Preparing for Surgery Before surgery, you can play an important role.  Because skin is not sterile, your skin needs to be as free of germs as possible.  You can reduce the number of germs on your skin by washing with CHG (chlorahexidine gluconate) soap before surgery.  CHG is an antiseptic cleaner which kills germs and bonds with the skin to continue killing germs even after washing. Please DO NOT use if you have an allergy to CHG or antibacterial soaps.  If your skin becomes reddened/irritated stop using the CHG and inform your nurse when you arrive at Short Stay. Do not shave (including legs and underarms) for at least 48 hours prior to the first CHG shower.  You may shave your face/neck. Please follow these instructions carefully:  1.  Shower with CHG Soap the night before surgery and the  morning of Surgery.  2.  If you choose to wash your hair, wash your hair first as usual with your  normal  shampoo.  3.  After you shampoo, rinse your hair and body thoroughly to remove the  shampoo.                           4.  Use CHG as you would any other liquid soap.  You can apply  chg directly  to the skin and wash                       Gently with a scrungie or clean washcloth.  5.  Apply the CHG Soap to your body ONLY FROM THE NECK DOWN.   Do not use on face/ open                           Wound or open sores. Avoid contact with eyes, ears mouth and genitals (private parts).                       Wash face,  Genitals (private parts) with your normal soap.             6.  Wash thoroughly, paying special attention to the area where your surgery  will be performed.  7.  Thoroughly rinse your body with warm water from the neck down.  8.  DO NOT shower/wash with your normal soap after using and rinsing off  the CHG Soap.                9.  Pat yourself dry with a clean towel.            10.  Wear clean pajamas.            11.  Place clean sheets on your bed the night of your first shower and do not  sleep with pets. Day of Surgery : Do not apply any lotions/deodorants the morning of surgery.  Please wear clean clothes to the hospital/surgery center.  FAILURE TO FOLLOW THESE INSTRUCTIONS MAY RESULT IN THE CANCELLATION OF YOUR SURGERY PATIENT SIGNATURE_________________________________  NURSE SIGNATURE__________________________________  ________________________________________________________________________  coughing and deep breathing exercises, leg exercises

## 2014-04-09 ENCOUNTER — Encounter (HOSPITAL_COMMUNITY)
Admission: RE | Admit: 2014-04-09 | Discharge: 2014-04-09 | Disposition: A | Discharge status: Home / Self Care | Payer: MEDICARE | Source: Ambulatory Visit | Attending: Urology | Admitting: Urology

## 2014-04-09 ENCOUNTER — Encounter (HOSPITAL_COMMUNITY): Payer: Self-pay

## 2014-04-09 DIAGNOSIS — N210 Calculus in bladder: Secondary | ICD-10-CM | POA: Insufficient documentation

## 2014-04-09 DIAGNOSIS — Z01818 Encounter for other preprocedural examination: Secondary | ICD-10-CM | POA: Insufficient documentation

## 2014-04-09 HISTORY — DX: Colostomy status: Z93.3

## 2014-04-09 HISTORY — DX: Other artificial openings of urinary tract status: Z93.6

## 2014-04-09 HISTORY — DX: Unspecified osteoarthritis, unspecified site: M19.90

## 2014-04-09 LAB — CBC
HCT: 35.5 % — ABNORMAL LOW (ref 36.0–46.0)
Hemoglobin: 11.3 g/dL — ABNORMAL LOW (ref 12.0–15.0)
MCH: 29.7 pg (ref 26.0–34.0)
MCHC: 31.8 g/dL (ref 30.0–36.0)
MCV: 93.4 fL (ref 78.0–100.0)
Platelets: 160 10*3/uL (ref 150–400)
RBC: 3.8 MIL/uL — ABNORMAL LOW (ref 3.87–5.11)
RDW: 14.4 % (ref 11.5–15.5)
WBC: 4.1 10*3/uL (ref 4.0–10.5)

## 2014-04-09 LAB — BASIC METABOLIC PANEL
Anion gap: 12 (ref 5–15)
BUN: 47 mg/dL — ABNORMAL HIGH (ref 6–23)
CO2: 24 mEq/L (ref 19–32)
Calcium: 10.2 mg/dL (ref 8.4–10.5)
Chloride: 100 mEq/L (ref 96–112)
Creatinine, Ser: 2.22 mg/dL — ABNORMAL HIGH (ref 0.50–1.10)
GFR, EST AFRICAN AMERICAN: 22 mL/min — AB (ref >90)
GFR, EST NON AFRICAN AMERICAN: 19 mL/min — AB (ref >90)
Glucose, Bld: 85 mg/dL (ref 70–99)
Potassium: 5.3 mEq/L (ref 3.7–5.3)
Sodium: 136 mEq/L — ABNORMAL LOW (ref 137–147)

## 2014-04-09 NOTE — Progress Notes (Signed)
BMP results faxed via EPIC to Dr Diona Fanti.

## 2014-04-16 MED ORDER — DEXTROSE 5 % IV SOLN
5.0000 mg/kg | INTRAVENOUS | Status: DC
  Filled 2014-04-16: qty 5.5

## 2014-04-16 MED ORDER — DEXTROSE 5 % IV SOLN
2.5000 mg/kg | INTRAVENOUS | Status: AC
  Administered 2014-04-17: 110 mg via INTRAVENOUS
  Filled 2014-04-16: qty 2.75

## 2014-04-16 NOTE — Anesthesia Preprocedure Evaluation (Addendum)
Anesthesia Evaluation  Patient identified by MRN, date of birth, ID band Patient awake    Reviewed: Allergy & Precautions, H&P , NPO status , Patient's Chart, lab work & pertinent test results  Airway Mallampati: II TM Distance: >3 FB Neck ROM: full    Dental  (+) Missing, Dental Advisory Given All upper front missing and all lateral and side teeth missing lower:   Pulmonary neg pulmonary ROS, COPDCurrent Smoker,  Mild smoking associated COPD breath sounds clear to auscultation  Pulmonary exam normal       Cardiovascular Exercise Tolerance: Good negative cardio ROS  Rhythm:regular Rate:Normal     Neuro/Psych negative neurological ROS  negative psych ROS   GI/Hepatic negative GI ROS, Neg liver ROS,   Endo/Other  negative endocrine ROS  Renal/GU negative Renal ROS  negative genitourinary   Musculoskeletal   Abdominal   Peds  Hematology negative hematology ROS (+)   Anesthesia Other Findings   Reproductive/Obstetrics negative OB ROS                         Anesthesia Physical Anesthesia Plan  ASA: III  Anesthesia Plan: General   Post-op Pain Management:    Induction: Intravenous  Airway Management Planned: LMA  Additional Equipment:   Intra-op Plan:   Post-operative Plan:   Informed Consent: I have reviewed the patients History and Physical, chart, labs and discussed the procedure including the risks, benefits and alternatives for the proposed anesthesia with the patient or authorized representative who has indicated his/her understanding and acceptance.   Dental Advisory Given  Plan Discussed with: CRNA and Surgeon  Anesthesia Plan Comments:        Anesthesia Quick Evaluation

## 2014-04-17 ENCOUNTER — Encounter (HOSPITAL_COMMUNITY): Payer: MEDICARE | Admitting: Anesthesiology

## 2014-04-17 ENCOUNTER — Encounter (HOSPITAL_COMMUNITY): Payer: Self-pay | Admitting: *Deleted

## 2014-04-17 ENCOUNTER — Ambulatory Visit (HOSPITAL_COMMUNITY): Payer: MEDICARE | Admitting: Anesthesiology

## 2014-04-17 ENCOUNTER — Encounter (HOSPITAL_COMMUNITY)
Admission: RE | Disposition: A | Discharge status: Home / Self Care | Payer: Self-pay | Source: Ambulatory Visit | Attending: Urology

## 2014-04-17 ENCOUNTER — Ambulatory Visit (HOSPITAL_COMMUNITY)
Admission: RE | Admit: 2014-04-17 | Discharge: 2014-04-17 | Disposition: A | Discharge status: Home / Self Care | Payer: MEDICARE | Source: Ambulatory Visit | Attending: Urology | Admitting: Urology

## 2014-04-17 DIAGNOSIS — E46 Unspecified protein-calorie malnutrition: Secondary | ICD-10-CM | POA: Insufficient documentation

## 2014-04-17 DIAGNOSIS — Z860100 Personal history of colon polyps, unspecified: Secondary | ICD-10-CM | POA: Insufficient documentation

## 2014-04-17 DIAGNOSIS — Z85038 Personal history of other malignant neoplasm of large intestine: Secondary | ICD-10-CM | POA: Insufficient documentation

## 2014-04-17 DIAGNOSIS — N210 Calculus in bladder: Secondary | ICD-10-CM | POA: Diagnosis not present

## 2014-04-17 DIAGNOSIS — F3289 Other specified depressive episodes: Secondary | ICD-10-CM | POA: Insufficient documentation

## 2014-04-17 DIAGNOSIS — Z933 Colostomy status: Secondary | ICD-10-CM | POA: Insufficient documentation

## 2014-04-17 DIAGNOSIS — F329 Major depressive disorder, single episode, unspecified: Secondary | ICD-10-CM | POA: Diagnosis not present

## 2014-04-17 DIAGNOSIS — M810 Age-related osteoporosis without current pathological fracture: Secondary | ICD-10-CM | POA: Insufficient documentation

## 2014-04-17 DIAGNOSIS — E538 Deficiency of other specified B group vitamins: Secondary | ICD-10-CM | POA: Diagnosis not present

## 2014-04-17 DIAGNOSIS — E559 Vitamin D deficiency, unspecified: Secondary | ICD-10-CM | POA: Insufficient documentation

## 2014-04-17 HISTORY — PX: CYSTOSCOPY WITH LITHOLAPAXY: SHX1425

## 2014-04-17 SURGERY — CYSTOSCOPY, WITH BLADDER CALCULUS LITHOLAPAXY
Anesthesia: General

## 2014-04-17 MED ORDER — ONDANSETRON HCL 4 MG/2ML IJ SOLN
INTRAMUSCULAR | Status: DC | PRN
  Administered 2014-04-17: 4 mg via INTRAVENOUS

## 2014-04-17 MED ORDER — SODIUM CHLORIDE 0.9 % IR SOLN
Status: DC | PRN
  Administered 2014-04-17: 3000 mL via INTRAVESICAL

## 2014-04-17 MED ORDER — STERILE WATER FOR IRRIGATION IR SOLN
Status: DC | PRN
  Administered 2014-04-17: 3000 mL

## 2014-04-17 MED ORDER — ONDANSETRON HCL 4 MG/2ML IJ SOLN
INTRAMUSCULAR | Status: AC
  Filled 2014-04-17: qty 2

## 2014-04-17 MED ORDER — BELLADONNA ALKALOIDS-OPIUM 16.2-60 MG RE SUPP
RECTAL | Status: DC | PRN
  Administered 2014-04-17: 1 via RECTAL

## 2014-04-17 MED ORDER — PHENYLEPHRINE HCL 10 MG/ML IJ SOLN
INTRAMUSCULAR | Status: DC | PRN
  Administered 2014-04-17 (×5): 80 ug via INTRAVENOUS
  Administered 2014-04-17: 120 ug via INTRAVENOUS

## 2014-04-17 MED ORDER — FENTANYL CITRATE 0.05 MG/ML IJ SOLN: 25.0000 ug | INTRAMUSCULAR | Status: DC | PRN

## 2014-04-17 MED ORDER — ACETAMINOPHEN 10 MG/ML IV SOLN
INTRAVENOUS | Status: DC | PRN
  Administered 2014-04-17: 700 mg via INTRAVENOUS

## 2014-04-17 MED ORDER — PROPOFOL 10 MG/ML IV BOLUS
INTRAVENOUS | Status: DC | PRN
  Administered 2014-04-17: 30 mg via INTRAVENOUS
  Administered 2014-04-17: 100 mg via INTRAVENOUS

## 2014-04-17 MED ORDER — ACETAMINOPHEN 10 MG/ML IV SOLN
1000.0000 mg | Freq: Once | INTRAVENOUS | Status: DC
  Filled 2014-04-17: qty 100

## 2014-04-17 MED ORDER — DEXAMETHASONE SODIUM PHOSPHATE 10 MG/ML IJ SOLN
INTRAMUSCULAR | Status: DC | PRN
  Administered 2014-04-17: 5 mg via INTRAVENOUS

## 2014-04-17 MED ORDER — FENTANYL CITRATE 0.05 MG/ML IJ SOLN
INTRAMUSCULAR | Status: AC
  Filled 2014-04-17: qty 2

## 2014-04-17 MED ORDER — LIDOCAINE HCL (CARDIAC) 20 MG/ML IV SOLN
INTRAVENOUS | Status: AC
  Filled 2014-04-17: qty 5

## 2014-04-17 MED ORDER — PHENYLEPHRINE 40 MCG/ML (10ML) SYRINGE FOR IV PUSH (FOR BLOOD PRESSURE SUPPORT)
PREFILLED_SYRINGE | INTRAVENOUS | Status: AC
  Filled 2014-04-17: qty 20

## 2014-04-17 MED ORDER — BELLADONNA ALKALOIDS-OPIUM 16.2-60 MG RE SUPP
RECTAL | Status: AC
  Filled 2014-04-17: qty 1

## 2014-04-17 MED ORDER — FENTANYL CITRATE 0.05 MG/ML IJ SOLN
INTRAMUSCULAR | Status: DC | PRN
  Administered 2014-04-17: 50 ug via INTRAVENOUS
  Administered 2014-04-17 (×2): 25 ug via INTRAVENOUS
  Administered 2014-04-17 (×2): 50 ug via INTRAVENOUS

## 2014-04-17 MED ORDER — LIDOCAINE HCL (CARDIAC) 10 MG/ML IV SOLN
INTRAVENOUS | Status: DC | PRN
Start: 2014-04-17 — End: 2014-04-17
  Administered 2014-04-17: 50 mg via INTRAVENOUS

## 2014-04-17 MED ORDER — LACTATED RINGERS IV SOLN: INTRAVENOUS | Status: DC

## 2014-04-17 MED ORDER — LACTATED RINGERS IV SOLN
INTRAVENOUS | Status: DC | PRN
  Administered 2014-04-17 (×2): via INTRAVENOUS

## 2014-04-17 MED ORDER — PROPOFOL 10 MG/ML IV BOLUS
INTRAVENOUS | Status: AC
  Filled 2014-04-17: qty 20

## 2014-04-17 MED ORDER — DOXYCYCLINE HYCLATE 100 MG PO TABS: 100.0000 mg | ORAL_TABLET | Freq: Two times a day (BID) | ORAL | Status: DC

## 2014-04-17 SURGICAL SUPPLY — 14 items
BAG URO CATCHER STRL LF (DRAPE) ×2 IMPLANT
CARTRIDGE STONEBREAK CO2 KIDNE (ELECTROSURGICAL) ×4 IMPLANT
CLOTH BEACON ORANGE TIMEOUT ST (SAFETY) ×2 IMPLANT
DRAPE CAMERA CLOSED 9X96 (DRAPES) ×2 IMPLANT
EVACUATOR MICROVAS BLADDER (UROLOGICAL SUPPLIES) ×1 IMPLANT
GLOVE BIOGEL M 8.0 STRL (GLOVE) ×2 IMPLANT
GOWN STRL REUS W/ TWL XL LVL3 (GOWN DISPOSABLE) ×1 IMPLANT
GOWN STRL REUS W/TWL XL LVL3 (GOWN DISPOSABLE) ×4 IMPLANT
KIT ASPIRATION TUBING (SET/KITS/TRAYS/PACK) ×2 IMPLANT
MANIFOLD NEPTUNE II (INSTRUMENTS) ×2 IMPLANT
PACK CYSTO (CUSTOM PROCEDURE TRAY) ×2 IMPLANT
PROBE KIDNEY STONEBRKR 2.0X425 (ELECTROSURGICAL) ×1 IMPLANT
SYRINGE IRR TOOMEY STRL 70CC (SYRINGE) IMPLANT
TUBING CONNECTING 10 (TUBING) IMPLANT

## 2014-04-17 NOTE — H&P (Signed)
H&P  Chief Complaint: Bladder stones  History of Present Illness: [name] is a 80 y.o. year old female who presents for cystoscopic management of symptomatic bladder calculi.  Past Medical History  Diagnosis Date  . History of colon cancer   . Depression   . Osteoporosis     Pt declined Rx  . Anemia   . LBP (low back pain)   . History of nephrolithiasis   . Ureteral stenosis   . Vitamin D deficiency   . Vitamin B12 deficiency   . Endometrial cancer   . Anal fistula   . Colon polyp   . Small bowel obstruction   . Radiation fibrosis     pelvic  . Malnutrition   . Fistula     vesicocolonic  . Colon perforation   . Nephrostomy status     replaced every 6-8 weeks  . Colostomy in place   . Arthritis     Past Surgical History  Procedure Laterality Date  . Abdominal hysterectomy    . Low anterior bowel resection  07/09/1988    For sigmoid cancer - Dr Margot Chimes  . Nephrostomy  2008    bilateral- Dr. Jules Schick  . Urinary diversion    . Tonsillectomy    . Abcess drainage    . Iliostomy  2004    Done due to perf cecum at [facility] in [place]  . Iliostomy revision  09/18/2002    for stenosis - Dr Margot Chimes  . Colostomy  07/12/2006    for colovesicle fistula - Dr Margot Chimes  . Colostomy takedown  07/17/2006    Dr Margot Chimes  . Colostomy takedown  01/13/2005    closed iliostomy -Dr Margot Chimes  . Ureteral blockage       04/05/14     Home Medications:  Medications Prior to Admission  Medication Sig Dispense Refill  . mirtazapine (REMERON) 15 MG tablet Take 1 tablet (15 mg total) by mouth at bedtime.  90 tablet  1  . oxyCODONE-acetaminophen (PERCOCET) 10-325 MG per tablet Take 1 tablet by mouth every 6 (six) hours as needed for pain.      Marland Kitchen sulfamethoxazole-trimethoprim (BACTRIM DS) 800-160 MG per tablet Take 1 tablet by mouth 2 (two) times daily. Patient takes 1/2 tablet two times daily - patient started on 04/05/2014      . temazepam (RESTORIL) 30 MG capsule Take 1 capsule  (30 mg total) by mouth at bedtime as needed for sleep.  90 capsule  1  . Cholecalciferol (VITAMIN D3) 1000 UNITS tablet Take 1,000-2,000 Units by mouth 2 (two) times daily. 1,000 units at lunch and 2,000 units at dinner      . Cyanocobalamin (VITAMIN B-12 PO) Place 3,000 mcg under the tongue daily.      . ferrous sulfate 325 (65 FE) MG tablet Take 325 mg by mouth daily with breakfast.      . polyethylene glycol (MIRALAX / GLYCOLAX) packet Take 17 g by mouth daily as needed for mild constipation. No specific days      . Probiotic Product (ALIGN) 4 MG CAPS Take 4 mg by mouth daily. For intestinal flora restoration        Allergies:  Allergies  Allergen Reactions  . Ciprofloxacin Other (See Comments)    Stomach   . Flagyl [Metronidazole Hcl]     nausea  . Gabapentin     unknown  . Raloxifene     unknown  . Venlafaxine     Family History  Problem  Relation Age of Onset  . Hypertension Other   . Kidney disease Other   . Heart disease Father     Social History:  reports that she has been smoking Cigarettes.  She has been smoking about 0.25 packs per day. She has never used smokeless tobacco. She reports that she drinks alcohol. She reports that she does not use illicit drugs.  ROS: A complete review of systems was performed.  All systems are negative except for pertinent findings as noted.  Physical Exam:  Vital signs in last 24 hours: Temp:  [97.6 F (36.4 C)] 97.6 F (36.4 C) (09/10 0543) Pulse Rate:  [68] 68 (09/10 0543) Resp:  [18] 18 (09/10 0543) BP: (118)/(57) 118/57 mmHg (09/10 0543) SpO2:  [100 %] 100 % (09/10 0543) Weight:  [43.545 kg (96 lb)] 43.545 kg (96 lb) (09/10 0539) General:  Alert and oriented, No acute distress HEENT: Normocephalic, atraumatic Neck: No JVD or lymphadenopathy Cardiovascular: Regular rate and rhythm Lungs: Clear bilaterally Abdomen: Soft, nontender, nondistended, no abdominal masses. Bilateral nephrostomy tubes. Back: No CVA  tenderness Extremities: No edema Neurologic: Grossly intact  Laboratory Data:  No results found for this or any previous visit (from the past 24 hour(s)). No results found for this or any previous visit (from the past 240 hour(s)). Creatinine: No results found for this basename: CREATININE,  in the last 168 hours  Radiologic Imaging: No results found.  Impression/Assessment:  Bladder calculi   Plan:  cystolithalopaxy  Jorja Loa 04/17/2014, 7:32 AM  Lillette Boxer. Monserrat Vidaurri MD

## 2014-04-17 NOTE — Transfer of Care (Signed)
Immediate Anesthesia Transfer of Care Note  Patient: [name]  Procedure(s) Performed: Procedure(s): CYSTOSCOPY WITH LITHOLAPAXY (N/A)  Patient Location: PACU  Anesthesia Type:General  Level of Consciousness: awake, alert , oriented and patient cooperative  Airway & Oxygen Therapy: Patient Spontanous Breathing and Patient connected to face mask oxygen  Post-op Assessment: Report given to PACU RN, Post -op Vital signs reviewed and stable and Patient moving all extremities X 4  Post vital signs: stable  Complications: No apparent anesthesia complications

## 2014-04-17 NOTE — Discharge Instructions (Signed)
1. You may see some blood draining from your bladder and may have some burning with urination for 48-72 hours. You also may notice that you have to urinate more frequently or urgently after your procedure which is normal.  2. You should call should you develop an inability urinate, fever > 101, persistent nausea and vomiting that prevents you from eating or drinking to stay hydrated.  3. If you have a catheter, you will be taught how to take care of the catheter by the nursing staff prior to discharge from the hospital.  You may periodically feel a strong urge to void with the catheter in place.  This is a bladder spasm and most often can occur when having a bowel movement or moving around. It is typically self-limited and usually will stop after a few minutes.  You may use some Vaseline or Neosporin around the tip of the catheter to reduce friction at the tip of the penis. You may also see some blood in the urine.  A very small amount of blood can make the urine look quite red.  As long as the catheter is draining well, there usually is not a problem.  However, if the catheter is not draining well and is bloody, you should call the office [phone]) to notify us.

## 2014-04-17 NOTE — Op Note (Signed)
PATIENT:  [place] DIAGNOSIS:   POST-OPERATIVE DIAGNOSIS: Same  PROCEDURE:   SURGEON:  Lillette Boxer. Anniece Bleiler, M.D.  ANESTHESIA:  General  EBL:  Minimal  DRAINS: None  LOCAL MEDICATIONS USED:  None  SPECIMEN:    INDICATION: [place] B Duggin is an 80 year old female with symptomatic bladder calculi. She has bilateral percutaneous nephrostomy tubes to divert her urine. She had recent embolization of her ureters performed percutaneously. She's had persistent bladder pain as well as a discharge. She does have large bladder calculi, greater than 3 cm in aggregate diameter. She presents at this time for cystoscopy and litholapaxy  Description of procedure: The patient was properly identified and marked (if applicable) in the holding area. They were then  taken to the operating room and placed on the table in a supine position. General anesthesia was then administered. Once fully anesthetized the patient was moved to the dorsolithotomy position and the genitalia and perineum were sterilely prepped and draped in standard fashion. An official timeout was then performed.  A 22 French panendoscope was advanced into her bladder. 3 large bladder calculi were seen, the largest over 2 cm. The other 2 were well over 1 cm each in diameter. No other bladder abnormalities were seen, except for some mucousy substance, most likely to substance use for her ureteral embolizations. No tumors were seen. Ureteral orifices were normal.  I then placed a resectoscope sheath using the obturator. I then placed the working bridge, and using the 2.0 Nelson, to 3 stones were fragmented into multiple smaller fragments which were then irrigated through the resectoscope sheath with the Microvasive irrigation apparatus. Copious irrigation was performed to eventually remove all fragments. A small mucousy substance was also removed. Repeat cystoscopy was performed until all fragments were removed. Because  the patient has no urine from her bladder, I did not leave the catheter. Following repeat inspection of the bladder and with no injury being seen, the scope was removed after the bladder was emptied. The patient was awakened and taken to the PACU in stable condition. Stone fragments were sent to the patient's daughter.    PLAN OF CARE: Discharge to home after PACU  PATIENT DISPOSITION:  PACU - hemodynamically stable.

## 2014-04-17 NOTE — Anesthesia Postprocedure Evaluation (Signed)
  Anesthesia Post-op Note  Patient: [name]  Procedure(s) Performed: Procedure(s) (LRB): CYSTOSCOPY WITH LITHOLAPAXY (N/A)  Patient Location: PACU  Anesthesia Type: General  Level of Consciousness: awake and alert   Airway and Oxygen Therapy: Patient Spontanous Breathing  Post-op Pain: mild  Post-op Assessment: Post-op Vital signs reviewed, Patient's Cardiovascular Status Stable, Respiratory Function Stable, Patent Airway and No signs of Nausea or vomiting  Last Vitals:  Filed Vitals:   04/17/14 0922  BP: 122/63  Pulse: 72  Temp: 36.6 C  Resp: 21    Post-op Vital Signs: stable   Complications: No apparent anesthesia complications

## 2014-04-18 ENCOUNTER — Encounter (HOSPITAL_COMMUNITY): Payer: Self-pay | Admitting: Urology

## 2014-05-16 ENCOUNTER — Telehealth: Payer: Self-pay | Admitting: Internal Medicine

## 2014-05-16 NOTE — Telephone Encounter (Signed)
Patient stated that [facility]'s faxed over form for prescription,all they need is the Dr's signature.  Thank You

## 2014-06-25 ENCOUNTER — Encounter: Payer: Self-pay | Admitting: Internal Medicine

## 2014-06-25 ENCOUNTER — Ambulatory Visit (INDEPENDENT_AMBULATORY_CARE_PROVIDER_SITE_OTHER): Payer: MEDICARE | Admitting: Internal Medicine

## 2014-06-25 VITALS — BP 90/58 | HR 80 | Temp 98.4°F | Wt 99.0 lb

## 2014-06-25 DIAGNOSIS — R945 Abnormal results of liver function studies: Secondary | ICD-10-CM

## 2014-06-25 DIAGNOSIS — M5440 Lumbago with sciatica, unspecified side: Secondary | ICD-10-CM

## 2014-06-25 DIAGNOSIS — R7989 Other specified abnormal findings of blood chemistry: Secondary | ICD-10-CM

## 2014-06-25 DIAGNOSIS — E538 Deficiency of other specified B group vitamins: Secondary | ICD-10-CM

## 2014-06-25 DIAGNOSIS — F331 Major depressive disorder, recurrent, moderate: Secondary | ICD-10-CM

## 2014-06-25 DIAGNOSIS — R634 Abnormal weight loss: Secondary | ICD-10-CM

## 2014-06-25 MED ORDER — TEMAZEPAM 30 MG PO CAPS: 30.0000 mg | ORAL_CAPSULE | Freq: Every evening | ORAL | Status: DC | PRN

## 2014-06-25 NOTE — Assessment & Plan Note (Signed)
Chronic  Potential benefits of a long term opioids use as well as potential risks (i.e. addiction risk, apnea etc) and complications (i.e. Somnolence, constipation and others) were explained to the patient and were aknowledged.  Percocet handwritten Rx was given (Pharmacy request)  #120 each x 3 mo 

## 2014-06-25 NOTE — Assessment & Plan Note (Signed)
Continue with current prescription therapy as reflected on the Med list.  

## 2014-06-25 NOTE — Progress Notes (Signed)
    Subjective:     HPI   The patient is here to follow up on chronic depression, anxiety, headaches and chronic diarrhea, abd and back pain  symptoms controlled with medicines, diet  [facility] pain Rx F/u UTIs - better F/u leg cramps, fatigue better 03/05/14: s/p bilateral ureteral embolization    Wt Readings from Last 3 Encounters:  06/25/14 99 lb (44.906 kg)  04/17/14 96 lb (43.545 kg)  04/09/14 96 lb (43.545 kg)   BP Readings from Last 3 Encounters:  06/25/14 90/58  04/17/14 105/52  04/09/14 93/49      Review of Systems  Constitutional: Negative for chills, activity change, appetite change, fatigue and unexpected weight change.  HENT: Negative for congestion, hearing loss, mouth sores and sinus pressure.   Eyes: Negative for visual disturbance.  Respiratory: Negative for cough and chest tightness.   Gastrointestinal: Positive for abdominal pain and diarrhea. Negative for nausea.  Genitourinary: Negative for frequency, difficulty urinating and vaginal pain.  Musculoskeletal: Negative for back pain and gait problem.  Skin: Negative for pallor and rash.  Neurological: Negative for dizziness, tremors, weakness, numbness and headaches.  Psychiatric/Behavioral: Negative for confusion, sleep disturbance and dysphoric mood.        Objective:   Physical Exam  Constitutional: She appears well-developed. No distress.  HENT:  Head: Normocephalic.  Right Ear: External ear normal.  Left Ear: External ear normal.  Nose: Nose normal.  Mouth/Throat: Oropharynx is clear and moist.  Eyes: Conjunctivae are normal. Pupils are equal, round, and reactive to light. Right eye exhibits no discharge. Left eye exhibits no discharge.  Neck: Normal range of motion. Neck supple. No JVD present. No tracheal deviation present. No thyromegaly present.  Cardiovascular: Normal rate, regular rhythm and normal heart sounds.   Pulmonary/Chest: No stridor. No respiratory distress.  She has no wheezes.  Abdominal: Soft. Bowel sounds are normal. She exhibits no distension and no mass. There is no tenderness. There is no rebound and no guarding.  Musculoskeletal: She exhibits no edema or tenderness.  Lymphadenopathy:    She has no cervical adenopathy.  Neurological: She displays normal reflexes. No cranial nerve deficit. She exhibits normal muscle tone. Coordination normal.  Skin: No rash noted. No erythema.  Psychiatric: She has a normal mood and affect. Her behavior is normal. Judgment and thought content normal.  LS is tender Colostomy, Urostomy    Lab Results  Component Value Date   WBC 4.1 04/09/2014   HGB 11.3* 04/09/2014   HCT 35.5* 04/09/2014   PLT 160 04/09/2014   GLUCOSE 85 04/09/2014   CHOL 148 04/29/2010   TRIG 98.0 04/29/2010   HDL 58.50 04/29/2010   LDLCALC 70 04/29/2010   ALT 41* 09/27/2013   AST 20 09/27/2013   NA 136* 04/09/2014   K 5.3 04/09/2014   CL 100 04/09/2014   CREATININE 2.22* 04/09/2014   BUN 47* 04/09/2014   CO2 24 04/09/2014   TSH 2.86 09/27/2013   INR 0.96 03/05/2014        Assessment & Plan:

## 2014-06-25 NOTE — Assessment & Plan Note (Signed)
Chronic. 

## 2014-06-25 NOTE — Assessment & Plan Note (Signed)
Wt Readings from Last 3 Encounters:  06/25/14 99 lb (44.906 kg)  04/17/14 96 lb (43.545 kg)  04/09/14 96 lb (43.545 kg)

## 2014-06-25 NOTE — Progress Notes (Signed)
Pre visit review using our clinic review tool, if applicable. No additional management support is needed unless otherwise documented below in the visit note. 

## 2014-06-26 ENCOUNTER — Telehealth: Payer: Self-pay | Admitting: Internal Medicine

## 2014-06-26 NOTE — Telephone Encounter (Signed)
emmi mailed  °

## 2014-09-24 ENCOUNTER — Ambulatory Visit (INDEPENDENT_AMBULATORY_CARE_PROVIDER_SITE_OTHER): Payer: Medicare Other | Admitting: Internal Medicine

## 2014-09-24 ENCOUNTER — Other Ambulatory Visit (INDEPENDENT_AMBULATORY_CARE_PROVIDER_SITE_OTHER): Payer: Medicare Other

## 2014-09-24 ENCOUNTER — Encounter: Payer: Self-pay | Admitting: Internal Medicine

## 2014-09-24 VITALS — BP 102/60 | HR 84 | Temp 98.7°F | Wt 101.0 lb

## 2014-09-24 DIAGNOSIS — R7989 Other specified abnormal findings of blood chemistry: Secondary | ICD-10-CM

## 2014-09-24 DIAGNOSIS — M810 Age-related osteoporosis without current pathological fracture: Secondary | ICD-10-CM

## 2014-09-24 DIAGNOSIS — E538 Deficiency of other specified B group vitamins: Secondary | ICD-10-CM

## 2014-09-24 DIAGNOSIS — R945 Abnormal results of liver function studies: Secondary | ICD-10-CM

## 2014-09-24 DIAGNOSIS — M5440 Lumbago with sciatica, unspecified side: Secondary | ICD-10-CM

## 2014-09-24 DIAGNOSIS — R634 Abnormal weight loss: Secondary | ICD-10-CM

## 2014-09-24 LAB — BASIC METABOLIC PANEL
BUN: 28 mg/dL — AB (ref 6–23)
CO2: 25 mEq/L (ref 19–32)
Calcium: 10.1 mg/dL (ref 8.4–10.5)
Chloride: 107 mEq/L (ref 96–112)
Creatinine, Ser: 1.44 mg/dL — ABNORMAL HIGH (ref 0.40–1.20)
GFR: 36.79 mL/min — AB (ref >60.00)
Glucose, Bld: 74 mg/dL (ref 70–99)
POTASSIUM: 4.4 meq/L (ref 3.5–5.1)
SODIUM: 139 meq/L (ref 135–145)

## 2014-09-24 LAB — HEPATIC FUNCTION PANEL
ALBUMIN: 3.7 g/dL (ref 3.5–5.2)
ALT: 12 U/L (ref 0–35)
AST: 15 U/L (ref 0–37)
Alkaline Phosphatase: 100 U/L (ref 39–117)
Bilirubin, Direct: 0.1 mg/dL (ref 0.0–0.3)
TOTAL PROTEIN: 7 g/dL (ref 6.0–8.3)
Total Bilirubin: 0.4 mg/dL (ref 0.2–1.2)

## 2014-09-24 LAB — CBC WITH DIFFERENTIAL/PLATELET
BASOS ABS: 0 10*3/uL (ref 0.0–0.1)
Basophils Relative: 0.3 % (ref 0.0–3.0)
EOS ABS: 0.2 10*3/uL (ref 0.0–0.7)
Eosinophils Relative: 3.7 % (ref 0.0–5.0)
HCT: 36.1 % (ref 36.0–46.0)
HEMOGLOBIN: 12.1 g/dL (ref 12.0–15.0)
Lymphocytes Relative: 19.8 % (ref 12.0–46.0)
Lymphs Abs: 0.9 10*3/uL (ref 0.7–4.0)
MCHC: 33.5 g/dL (ref 30.0–36.0)
MCV: 90.2 fl (ref 78.0–100.0)
MONOS PCT: 8.6 % (ref 3.0–12.0)
Monocytes Absolute: 0.4 10*3/uL (ref 0.1–1.0)
NEUTROS PCT: 67.6 % (ref 43.0–77.0)
Neutro Abs: 3.2 10*3/uL (ref 1.4–7.7)
Platelets: 154 10*3/uL (ref 150.0–400.0)
RBC: 4 Mil/uL (ref 3.87–5.11)
RDW: 14.7 % (ref 11.5–15.5)
WBC: 4.7 10*3/uL (ref 4.0–10.5)

## 2014-09-24 LAB — TSH: TSH: 3.87 u[IU]/mL (ref 0.35–4.50)

## 2014-09-24 MED ORDER — TEMAZEPAM 30 MG PO CAPS: 30.0000 mg | ORAL_CAPSULE | Freq: Every evening | ORAL | Status: DC | PRN

## 2014-09-24 NOTE — Assessment & Plan Note (Signed)
Vit D 

## 2014-09-24 NOTE — Assessment & Plan Note (Signed)
Continue with current prescription therapy as reflected on the Med list.  

## 2014-09-24 NOTE — Progress Notes (Signed)
Pre visit review using our clinic review tool, if applicable. No additional management support is needed unless otherwise documented below in the visit note. 

## 2014-09-24 NOTE — Assessment & Plan Note (Signed)
LFTs 

## 2014-09-24 NOTE — Assessment & Plan Note (Signed)
Better - gaining wt

## 2014-09-24 NOTE — Progress Notes (Signed)
    Subjective:     HPI   The patient is here to follow up on chronic depression, anxiety, headaches and chronic diarrhea, abd and back pain  symptoms controlled with medicines, diet  [facility] on [facility] F/u UTIs - better F/u leg cramps, fatigue better 03/05/14: s/p bilateral ureteral embolization    Wt Readings from Last 3 Encounters:  09/24/14 101 lb (45.813 kg)  06/25/14 99 lb (44.906 kg)  04/17/14 96 lb (43.545 kg)   BP Readings from Last 3 Encounters:  09/24/14 102/60  06/25/14 90/58  04/17/14 105/52      Review of Systems  Constitutional: Negative for chills, activity change, appetite change, fatigue and unexpected weight change.  HENT: Negative for congestion, hearing loss, mouth sores and sinus pressure.   Eyes: Negative for visual disturbance.  Respiratory: Negative for cough and chest tightness.   Gastrointestinal: Positive for abdominal pain and diarrhea. Negative for nausea.  Genitourinary: Negative for frequency, difficulty urinating and vaginal pain.  Musculoskeletal: Negative for back pain and gait problem.  Skin: Negative for pallor and rash.  Neurological: Negative for dizziness, tremors, weakness, numbness and headaches.  Psychiatric/Behavioral: Negative for confusion, sleep disturbance and dysphoric mood.        Objective:   Physical Exam  Constitutional: She appears well-developed. No distress.  HENT:  Head: Normocephalic.  Right Ear: External ear normal.  Left Ear: External ear normal.  Nose: Nose normal.  Mouth/Throat: Oropharynx is clear and moist.  Eyes: Conjunctivae are normal. Pupils are equal, round, and reactive to light. Right eye exhibits no discharge. Left eye exhibits no discharge.  Neck: Normal range of motion. Neck supple. No JVD present. No tracheal deviation present. No thyromegaly present.  Cardiovascular: Normal rate, regular rhythm and normal heart sounds.   Pulmonary/Chest: No stridor. No respiratory distress. She  has no wheezes.  Abdominal: Soft. Bowel sounds are normal. She exhibits no distension and no mass. There is no tenderness. There is no rebound and no guarding.  Musculoskeletal: She exhibits no edema or tenderness.  Lymphadenopathy:    She has no cervical adenopathy.  Neurological: She displays normal reflexes. No cranial nerve deficit. She exhibits normal muscle tone. Coordination normal.  Skin: No rash noted. No erythema.  Psychiatric: She has a normal mood and affect. Her behavior is normal. Judgment and thought content normal.  LS is tender Colostomy, Urostomy    Lab Results  Component Value Date   WBC 4.1 04/09/2014   HGB 11.3* 04/09/2014   HCT 35.5* 04/09/2014   PLT 160 04/09/2014   GLUCOSE 85 04/09/2014   CHOL 148 04/29/2010   TRIG 98.0 04/29/2010   HDL 58.50 04/29/2010   LDLCALC 70 04/29/2010   ALT 41* 09/27/2013   AST 20 09/27/2013   NA 136* 04/09/2014   K 5.3 04/09/2014   CL 100 04/09/2014   CREATININE 2.22* 04/09/2014   BUN 47* 04/09/2014   CO2 24 04/09/2014   TSH 2.86 09/27/2013   INR 0.96 03/05/2014        Assessment & Plan:

## 2014-12-04 ENCOUNTER — Telehealth: Payer: Self-pay

## 2014-12-04 NOTE — Telephone Encounter (Signed)
Error in last note regarding spouse/

## 2014-12-04 NOTE — Telephone Encounter (Signed)
Call to educate regarding AWV; Patient reviewed current history and states she is basically maintaining her health and spouse in dialysis. Comes in approx every 3 months/ reviewed need for pneumovax 23 and can get this when she comes in next visit. Also discussed dexa scan but doesn't feel she needs one at this time. Overall, managing with her issues and taking one day at a time.

## 2014-12-31 ENCOUNTER — Ambulatory Visit (INDEPENDENT_AMBULATORY_CARE_PROVIDER_SITE_OTHER): Payer: Medicare Other | Admitting: Internal Medicine

## 2014-12-31 ENCOUNTER — Encounter: Payer: Self-pay | Admitting: Internal Medicine

## 2014-12-31 VITALS — BP 102/60 | HR 74 | Wt 101.0 lb

## 2014-12-31 DIAGNOSIS — E538 Deficiency of other specified B group vitamins: Secondary | ICD-10-CM | POA: Diagnosis not present

## 2014-12-31 DIAGNOSIS — K9403 Colostomy malfunction: Secondary | ICD-10-CM

## 2014-12-31 DIAGNOSIS — M810 Age-related osteoporosis without current pathological fracture: Secondary | ICD-10-CM | POA: Diagnosis not present

## 2014-12-31 DIAGNOSIS — E559 Vitamin D deficiency, unspecified: Secondary | ICD-10-CM | POA: Diagnosis not present

## 2014-12-31 DIAGNOSIS — M5440 Lumbago with sciatica, unspecified side: Secondary | ICD-10-CM

## 2014-12-31 DIAGNOSIS — R634 Abnormal weight loss: Secondary | ICD-10-CM

## 2014-12-31 MED ORDER — TEMAZEPAM 30 MG PO CAPS: 30.0000 mg | ORAL_CAPSULE | Freq: Every evening | ORAL | Status: DC | PRN

## 2014-12-31 MED ORDER — MIRTAZAPINE 15 MG PO TABS: 15.0000 mg | ORAL_TABLET | Freq: Every day | ORAL | Status: DC

## 2014-12-31 NOTE — Assessment & Plan Note (Signed)
Overall doing well.

## 2014-12-31 NOTE — Progress Notes (Signed)
    Subjective:     HPI   The patient is here to follow up on chronic depression, anxiety, headaches and chronic diarrhea, abd and back pain  symptoms controlled with medicines, diet  [facility] on [facility] F/u UTIs - better F/u leg cramps, fatigue better 03/05/14: s/p bilateral ureteral embolization    Wt Readings from Last 3 Encounters:  12/31/14 101 lb (45.813 kg)  09/24/14 101 lb (45.813 kg)  06/25/14 99 lb (44.906 kg)   BP Readings from Last 3 Encounters:  12/31/14 102/60  09/24/14 102/60  06/25/14 90/58      Review of Systems  Constitutional: Negative for chills, activity change, appetite change, fatigue and unexpected weight change.  HENT: Negative for congestion, hearing loss, mouth sores and sinus pressure.   Eyes: Negative for visual disturbance.  Respiratory: Negative for cough and chest tightness.   Gastrointestinal: Positive for abdominal pain and diarrhea. Negative for nausea.  Genitourinary: Negative for frequency, difficulty urinating and vaginal pain.  Musculoskeletal: Negative for back pain and gait problem.  Skin: Negative for pallor and rash.  Neurological: Negative for dizziness, tremors, weakness, numbness and headaches.  Psychiatric/Behavioral: Negative for confusion, sleep disturbance and dysphoric mood.        Objective:   Physical Exam  Constitutional: She appears well-developed. No distress.  HENT:  Head: Normocephalic.  Right Ear: External ear normal.  Left Ear: External ear normal.  Nose: Nose normal.  Mouth/Throat: Oropharynx is clear and moist.  Eyes: Conjunctivae are normal. Pupils are equal, round, and reactive to light. Right eye exhibits no discharge. Left eye exhibits no discharge.  Neck: Normal range of motion. Neck supple. No JVD present. No tracheal deviation present. No thyromegaly present.  Cardiovascular: Normal rate, regular rhythm and normal heart sounds.   Pulmonary/Chest: No stridor. No respiratory distress.  She has no wheezes.  Abdominal: Soft. Bowel sounds are normal. She exhibits no distension and no mass. There is no tenderness. There is no rebound and no guarding.  Musculoskeletal: She exhibits no edema or tenderness.  Lymphadenopathy:    She has no cervical adenopathy.  Neurological: She displays normal reflexes. No cranial nerve deficit. She exhibits normal muscle tone. Coordination normal.  Skin: No rash noted. No erythema.  Psychiatric: She has a normal mood and affect. Her behavior is normal. Judgment and thought content normal.  LS is tender Colostomy, Urostomy    Lab Results  Component Value Date   WBC 4.7 09/24/2014   HGB 12.1 09/24/2014   HCT 36.1 09/24/2014   PLT 154.0 09/24/2014   GLUCOSE 74 09/24/2014   CHOL 148 04/29/2010   TRIG 98.0 04/29/2010   HDL 58.50 04/29/2010   LDLCALC 70 04/29/2010   ALT 12 09/24/2014   AST 15 09/24/2014   NA 139 09/24/2014   K 4.4 09/24/2014   CL 107 09/24/2014   CREATININE 1.44* 09/24/2014   BUN 28* 09/24/2014   CO2 25 09/24/2014   TSH 3.87 09/24/2014   INR 0.96 03/05/2014        Assessment & Plan:

## 2014-12-31 NOTE — Assessment & Plan Note (Signed)
On B12 

## 2014-12-31 NOTE — Assessment & Plan Note (Signed)
On Vit D 

## 2014-12-31 NOTE — Assessment & Plan Note (Signed)
Chronic  Potential benefits of a long term opioids use as well as potential risks (i.e. addiction risk, apnea etc) and complications (i.e. Somnolence, constipation and others) were explained to the patient and were aknowledged.  Percocet handwritten Rx was given (Pharmacy request)  #120 each x 3 mo

## 2014-12-31 NOTE — Assessment & Plan Note (Signed)
Wt Readings from Last 3 Encounters:  12/31/14 101 lb (45.813 kg)  09/24/14 101 lb (45.813 kg)  06/25/14 99 lb (44.906 kg)

## 2014-12-31 NOTE — Progress Notes (Signed)
Pre visit review using our clinic review tool, if applicable. No additional management support is needed unless otherwise documented below in the visit note. 

## 2015-01-31 ENCOUNTER — Other Ambulatory Visit: Payer: Self-pay | Admitting: Internal Medicine

## 2015-04-01 ENCOUNTER — Other Ambulatory Visit (INDEPENDENT_AMBULATORY_CARE_PROVIDER_SITE_OTHER): Payer: Medicare Other

## 2015-04-01 ENCOUNTER — Encounter: Payer: Self-pay | Admitting: Internal Medicine

## 2015-04-01 ENCOUNTER — Ambulatory Visit (INDEPENDENT_AMBULATORY_CARE_PROVIDER_SITE_OTHER): Payer: Medicare Other | Admitting: Internal Medicine

## 2015-04-01 VITALS — BP 112/70 | HR 57 | Wt 102.0 lb

## 2015-04-01 DIAGNOSIS — E538 Deficiency of other specified B group vitamins: Secondary | ICD-10-CM

## 2015-04-01 DIAGNOSIS — M5440 Lumbago with sciatica, unspecified side: Secondary | ICD-10-CM

## 2015-04-01 DIAGNOSIS — R7989 Other specified abnormal findings of blood chemistry: Secondary | ICD-10-CM

## 2015-04-01 DIAGNOSIS — R634 Abnormal weight loss: Secondary | ICD-10-CM

## 2015-04-01 DIAGNOSIS — R1084 Generalized abdominal pain: Secondary | ICD-10-CM

## 2015-04-01 DIAGNOSIS — R945 Abnormal results of liver function studies: Secondary | ICD-10-CM

## 2015-04-01 LAB — BASIC METABOLIC PANEL
BUN: 29 mg/dL — ABNORMAL HIGH (ref 6–23)
CO2: 26 meq/L (ref 19–32)
Calcium: 10.2 mg/dL (ref 8.4–10.5)
Chloride: 108 mEq/L (ref 96–112)
Creatinine, Ser: 1.34 mg/dL — ABNORMAL HIGH (ref 0.40–1.20)
GFR: 39.92 mL/min — ABNORMAL LOW (ref >60.00)
GLUCOSE: 86 mg/dL (ref 70–99)
POTASSIUM: 4.4 meq/L (ref 3.5–5.1)
SODIUM: 140 meq/L (ref 135–145)

## 2015-04-01 LAB — CBC WITH DIFFERENTIAL/PLATELET
Basophils Absolute: 0 10*3/uL (ref 0.0–0.1)
Basophils Relative: 0.3 % (ref 0.0–3.0)
EOS PCT: 4.2 % (ref 0.0–5.0)
Eosinophils Absolute: 0.1 10*3/uL (ref 0.0–0.7)
HEMATOCRIT: 37.8 % (ref 36.0–46.0)
Hemoglobin: 12.5 g/dL (ref 12.0–15.0)
LYMPHS ABS: 0.8 10*3/uL (ref 0.7–4.0)
Lymphocytes Relative: 23.5 % (ref 12.0–46.0)
MCHC: 33 g/dL (ref 30.0–36.0)
MCV: 91.4 fl (ref 78.0–100.0)
MONOS PCT: 8 % (ref 3.0–12.0)
Monocytes Absolute: 0.3 10*3/uL (ref 0.1–1.0)
NEUTROS ABS: 2.3 10*3/uL (ref 1.4–7.7)
NEUTROS PCT: 64 % (ref 43.0–77.0)
PLATELETS: 140 10*3/uL — AB (ref 150.0–400.0)
RBC: 4.14 Mil/uL (ref 3.87–5.11)
RDW: 15 % (ref 11.5–15.5)
WBC: 3.6 10*3/uL — ABNORMAL LOW (ref 4.0–10.5)

## 2015-04-01 LAB — HEPATIC FUNCTION PANEL
ALBUMIN: 3.7 g/dL (ref 3.5–5.2)
ALT: 9 U/L (ref 0–35)
AST: 14 U/L (ref 0–37)
Alkaline Phosphatase: 102 U/L (ref 39–117)
Bilirubin, Direct: 0.1 mg/dL (ref 0.0–0.3)
TOTAL PROTEIN: 6.9 g/dL (ref 6.0–8.3)
Total Bilirubin: 0.3 mg/dL (ref 0.2–1.2)

## 2015-04-01 MED ORDER — TEMAZEPAM 30 MG PO CAPS: 30.0000 mg | ORAL_CAPSULE | Freq: Every evening | ORAL | Status: DC | PRN

## 2015-04-01 MED ORDER — POLYETHYLENE GLYCOL 3350 17 GM/SCOOP PO POWD: 1.0000 | Freq: Once | ORAL | Status: AC

## 2015-04-01 NOTE — Assessment & Plan Note (Signed)
Chronic  Potential benefits of a long term opioids use as well as potential risks (i.e. addiction risk, apnea etc) and complications (i.e. Somnolence, constipation and others) were explained to the patient and were aknowledged.  Percocet handwritten Rx was given (Pharmacy request)  #120 each x 3 mo

## 2015-04-01 NOTE — Assessment & Plan Note (Signed)
On B12 

## 2015-04-01 NOTE — Progress Notes (Signed)
Pre visit review using our clinic review tool, if applicable. No additional management support is needed unless otherwise documented below in the visit note. 

## 2015-04-01 NOTE — Assessment & Plan Note (Signed)
No change 

## 2015-04-01 NOTE — Progress Notes (Signed)
Subjective:  Patient ID: [name], female    DOB: 1928/06/23  Age: 81 y.o. MRN: [id number]  CC: No chief complaint on file.   HPI [name] presents for a follow up on chronic depression, anxiety, headaches and chronic diarrhea, abd and back pain symptoms controlled with medicines, diet .  [facility]  03/05/14: s/p bilateral ureteral embolization   Outpatient Prescriptions Prior to Visit  Medication Sig Dispense Refill  . Cholecalciferol (VITAMIN D3) 1000 UNITS tablet Take 1,000-2,000 Units by mouth 2 (two) times daily. 1,000 units at lunch and 2,000 units at dinner    . Cyanocobalamin (VITAMIN B-12 PO) Place 3,000 mcg under the tongue daily.    . ferrous sulfate 325 (65 FE) MG tablet Take 325 mg by mouth daily with breakfast.    . mirtazapine (REMERON) 15 MG tablet Take 1 tablet (15 mg total) by mouth at bedtime. 90 tablet 1  . mirtazapine (REMERON) 15 MG tablet TAKE 1 TABLET AT BEDTIME 90 tablet 2  . oxyCODONE-acetaminophen (PERCOCET) 10-325 MG per tablet Take 1 tablet by mouth every 6 (six) hours as needed for pain.    . Probiotic Product (ALIGN) 4 MG CAPS Take 4 mg by mouth daily. For intestinal flora restoration    . polyethylene glycol (MIRALAX / GLYCOLAX) packet Take 17 g by mouth daily as needed for mild constipation. No specific days    . temazepam (RESTORIL) 30 MG capsule Take 1 capsule (30 mg total) by mouth at bedtime as needed for sleep. 90 capsule 1   No facility-administered medications prior to visit.    ROS Review of Systems  Constitutional: Negative for chills, activity change, appetite change, fatigue and unexpected weight change.  HENT: Negative for congestion, mouth sores and sinus pressure.   Eyes: Negative for visual disturbance.  Respiratory: Negative for cough and chest tightness.   Gastrointestinal: Positive for diarrhea. Negative for nausea and abdominal pain.  Genitourinary: Positive for frequency. Negative for  difficulty urinating and vaginal pain.  Musculoskeletal: Positive for back pain and arthralgias. Negative for gait problem.  Skin: Negative for pallor and rash.  Neurological: Negative for dizziness, tremors, weakness, numbness and headaches.  Psychiatric/Behavioral: Negative for suicidal ideas, confusion and sleep disturbance. The patient is not nervous/anxious.     Objective:  BP 112/70 mmHg  Pulse 57  Wt 102 lb (46.267 kg)  SpO2 97%  BP Readings from Last 3 Encounters:  04/01/15 112/70  12/31/14 102/60  09/24/14 102/60    Wt Readings from Last 3 Encounters:  04/01/15 102 lb (46.267 kg)  12/31/14 101 lb (45.813 kg)  09/24/14 101 lb (45.813 kg)    Physical Exam  Constitutional: She appears well-developed. No distress.  HENT:  Head: Normocephalic.  Right Ear: External ear normal.  Left Ear: External ear normal.  Nose: Nose normal.  Mouth/Throat: Oropharynx is clear and moist.  Eyes: Conjunctivae are normal. Pupils are equal, round, and reactive to light. Right eye exhibits no discharge. Left eye exhibits no discharge.  Neck: Normal range of motion. Neck supple. No JVD present. No tracheal deviation present. No thyromegaly present.  Cardiovascular: Normal rate, regular rhythm and normal heart sounds.   Pulmonary/Chest: No stridor. No respiratory distress. She has no wheezes.  Abdominal: Soft. Bowel sounds are normal. She exhibits no distension and no mass. There is no tenderness. There is no rebound and no guarding.  Musculoskeletal: She exhibits no edema or tenderness.  Lymphadenopathy:    She has no  cervical adenopathy.  Neurological: She displays normal reflexes. No cranial nerve deficit. She exhibits normal muscle tone. Coordination normal.  Skin: No rash noted. No erythema.  Psychiatric: She has a normal mood and affect. Her behavior is normal. Judgment and thought content normal.  colost bag  Lab Results  Component Value Date   WBC 4.7 09/24/2014   HGB 12.1  09/24/2014   HCT 36.1 09/24/2014   PLT 154.0 09/24/2014   GLUCOSE 74 09/24/2014   CHOL 148 04/29/2010   TRIG 98.0 04/29/2010   HDL 58.50 04/29/2010   LDLCALC 70 04/29/2010   ALT 12 09/24/2014   AST 15 09/24/2014   NA 139 09/24/2014   K 4.4 09/24/2014   CL 107 09/24/2014   CREATININE 1.44* 09/24/2014   BUN 28* 09/24/2014   CO2 25 09/24/2014   TSH 3.87 09/24/2014   INR 0.96 03/05/2014    No results found.  Assessment & Plan:   Diagnoses and all orders for this visit:  B12 deficiency -     Basic metabolic panel; Future -     CBC with Differential/Platelet; Future -     Hepatic function panel; Future  Midline low back pain with sciatica, sciatica laterality unspecified -     Basic metabolic panel; Future -     CBC with Differential/Platelet; Future -     Hepatic function panel; Future  WEIGHT LOSS -     Basic metabolic panel; Future -     CBC with Differential/Platelet; Future -     Hepatic function panel; Future  Abdominal pain, generalized -     Basic metabolic panel; Future -     CBC with Differential/Platelet; Future -     Hepatic function panel; Future  Other orders -     temazepam (RESTORIL) 30 MG capsule; Take 1 capsule (30 mg total) by mouth at bedtime as needed for sleep. -     Cancel: polyethylene glycol (MIRALAX / GLYCOLAX) packet; Take 17 g by mouth daily as needed for mild constipation. No specific days -     polyethylene glycol powder (GLYCOLAX/MIRALAX) powder; Take 255 g by mouth once.  I have discontinued Ms. Noguchi's polyethylene glycol. I am also having her start on polyethylene glycol powder. Additionally, I am having her maintain her ALIGN, cholecalciferol, Cyanocobalamin (VITAMIN B-12 PO), oxyCODONE-acetaminophen, ferrous sulfate, mirtazapine, mirtazapine, and temazepam.  Meds ordered this encounter  Medications  . temazepam (RESTORIL) 30 MG capsule    Sig: Take 1 capsule (30 mg total) by mouth at bedtime as needed for sleep.    Dispense:  90  capsule    Refill:  1  . polyethylene glycol powder (GLYCOLAX/MIRALAX) powder    Sig: Take 255 g by mouth once.    Dispense:  255 g    Refill:  5     Follow-up: Return in about 3 months (around 07/02/2015) for a follow-up visit.  Walker Kehr, MD

## 2015-04-01 NOTE — Assessment & Plan Note (Signed)
Stable wt 

## 2015-04-01 NOTE — Assessment & Plan Note (Signed)
LFTs 

## 2015-05-30 ENCOUNTER — Other Ambulatory Visit: Payer: Self-pay | Admitting: Internal Medicine

## 2015-07-08 ENCOUNTER — Encounter: Payer: Self-pay | Admitting: Internal Medicine

## 2015-07-08 ENCOUNTER — Ambulatory Visit (INDEPENDENT_AMBULATORY_CARE_PROVIDER_SITE_OTHER): Payer: Medicare Other | Admitting: Internal Medicine

## 2015-07-08 VITALS — BP 100/70 | HR 69 | Wt 99.0 lb

## 2015-07-08 DIAGNOSIS — G8929 Other chronic pain: Secondary | ICD-10-CM

## 2015-07-08 DIAGNOSIS — Z23 Encounter for immunization: Secondary | ICD-10-CM

## 2015-07-08 DIAGNOSIS — R634 Abnormal weight loss: Secondary | ICD-10-CM | POA: Diagnosis not present

## 2015-07-08 DIAGNOSIS — M5441 Lumbago with sciatica, right side: Secondary | ICD-10-CM

## 2015-07-08 DIAGNOSIS — R1084 Generalized abdominal pain: Secondary | ICD-10-CM

## 2015-07-08 DIAGNOSIS — E538 Deficiency of other specified B group vitamins: Secondary | ICD-10-CM | POA: Diagnosis not present

## 2015-07-08 DIAGNOSIS — M5442 Lumbago with sciatica, left side: Secondary | ICD-10-CM

## 2015-07-08 MED ORDER — OXYCODONE-ACETAMINOPHEN 10-325 MG PO TABS: 1.0000 | ORAL_TABLET | Freq: Four times a day (QID) | ORAL | Status: DC | PRN

## 2015-07-08 NOTE — Progress Notes (Signed)
Pre visit review using our clinic review tool, if applicable. No additional management support is needed unless otherwise documented below in the visit note. 

## 2015-07-08 NOTE — Patient Instructions (Signed)
Try a power nap 10-30 min (max 45 min)

## 2015-07-08 NOTE — Progress Notes (Signed)
Subjective:  Patient ID: [name], female    DOB: Aug 07, 1928  Age: 81 y.o. MRN: [id number]  CC: No chief complaint on file.   HPI [place] [facility] presents for chronic pain, Vit D and B12 def. C/o more arthritic pains. F/u wt loss and depression  Outpatient Prescriptions Prior to Visit  Medication Sig Dispense Refill  . Cholecalciferol (VITAMIN D3) 1000 UNITS tablet Take 1,000-2,000 Units by mouth 2 (two) times daily. 1,000 units at lunch and 2,000 units at dinner    . Cyanocobalamin (VITAMIN B-12 PO) Place 3,000 mcg under the tongue daily.    . ferrous sulfate 325 (65 FE) MG tablet Take 325 mg by mouth daily with breakfast.    . ibuprofen (ADVIL,MOTRIN) 600 MG tablet TAKE 1 TABLET (600 MG TOTAL) BY MOUTH EVERY 8 (EIGHT) HOURS AS NEEDED FOR MODERATE PAIN. FOR PAIN 90 tablet 0  . mirtazapine (REMERON) 15 MG tablet TAKE 1 TABLET AT BEDTIME 90 tablet 2  . polyethylene glycol powder (GLYCOLAX/MIRALAX) powder Take 255 g by mouth once. 255 g 5  . Probiotic Product (ALIGN) 4 MG CAPS Take 4 mg by mouth daily. For intestinal flora restoration    . temazepam (RESTORIL) 30 MG capsule Take 1 capsule (30 mg total) by mouth at bedtime as needed for sleep. 90 capsule 1  . mirtazapine (REMERON) 15 MG tablet Take 1 tablet (15 mg total) by mouth at bedtime. 90 tablet 1  . oxyCODONE-acetaminophen (PERCOCET) 10-325 MG per tablet Take 1 tablet by mouth every 6 (six) hours as needed for pain.     No facility-administered medications prior to visit.    ROS Review of Systems  Constitutional: Positive for fatigue and unexpected weight change. Negative for chills, activity change and appetite change.  HENT: Negative for congestion, mouth sores and sinus pressure.   Eyes: Negative for visual disturbance.  Respiratory: Negative for cough and chest tightness.   Gastrointestinal: Positive for abdominal pain. Negative for nausea.  Genitourinary: Negative for frequency, difficulty urinating and vaginal pain.   Musculoskeletal: Positive for back pain and arthralgias. Negative for gait problem.  Skin: Negative for pallor and rash.  Neurological: Negative for dizziness, tremors, weakness, numbness and headaches.  Psychiatric/Behavioral: Negative for confusion and sleep disturbance.    Objective:  BP 100/70 mmHg  Pulse 69  Wt 99 lb (44.906 kg)  SpO2 97%  BP Readings from Last 3 Encounters:  07/08/15 100/70  04/01/15 112/70  12/31/14 102/60    Wt Readings from Last 3 Encounters:  07/08/15 99 lb (44.906 kg)  04/01/15 102 lb (46.267 kg)  12/31/14 101 lb (45.813 kg)    Physical Exam  Constitutional: She appears well-developed. No distress.  HENT:  Head: Normocephalic.  Right Ear: External ear normal.  Left Ear: External ear normal.  Nose: Nose normal.  Mouth/Throat: Oropharynx is clear and moist.  Eyes: Conjunctivae are normal. Pupils are equal, round, and reactive to light. Right eye exhibits no discharge. Left eye exhibits no discharge.  Neck: Normal range of motion. Neck supple. No JVD present. No tracheal deviation present. No thyromegaly present.  Cardiovascular: Normal rate, regular rhythm and normal heart sounds.   Pulmonary/Chest: No stridor. No respiratory distress. She has no wheezes.  Abdominal: Soft. Bowel sounds are normal. She exhibits no distension and no mass. There is no tenderness. There is no rebound and no guarding.  Musculoskeletal: She exhibits tenderness. She exhibits no edema.  Lymphadenopathy:    She has no cervical adenopathy.  Neurological: She displays  normal reflexes. No cranial nerve deficit. She exhibits normal muscle tone. Coordination normal.  Skin: No rash noted. No erythema.  Psychiatric: She has a normal mood and affect. Her behavior is normal. Judgment and thought content normal.  Thin  Lab Results  Component Value Date   WBC 3.6* 04/01/2015   HGB 12.5 04/01/2015   HCT 37.8 04/01/2015   PLT 140.0* 04/01/2015   GLUCOSE 86 04/01/2015   CHOL  148 04/29/2010   TRIG 98.0 04/29/2010   HDL 58.50 04/29/2010   LDLCALC 70 04/29/2010   ALT 9 04/01/2015   AST 14 04/01/2015   NA 140 04/01/2015   K 4.4 04/01/2015   CL 108 04/01/2015   CREATININE 1.34* 04/01/2015   BUN 29* 04/01/2015   CO2 26 04/01/2015   TSH 3.87 09/24/2014   INR 0.96 03/05/2014    No results found.  Assessment & Plan:   Diagnoses and all orders for this visit:  B12 deficiency  Abdominal pain, generalized  Chronic midline low back pain with bilateral sciatica  WEIGHT LOSS  Need for influenza vaccination -     Flu Vaccine QUAD 36+ mos IM  Other orders -     oxyCODONE-acetaminophen (PERCOCET) 10-325 MG tablet; Take 1 tablet by mouth every 6 (six) hours as needed for pain.  I have changed Ms. Hebel's oxyCODONE-acetaminophen. I am also having her maintain her ALIGN, cholecalciferol, Cyanocobalamin (VITAMIN B-12 PO), ferrous sulfate, mirtazapine, temazepam, polyethylene glycol powder, and ibuprofen.  Meds ordered this encounter  Medications  . oxyCODONE-acetaminophen (PERCOCET) 10-325 MG tablet    Sig: Take 1 tablet by mouth every 6 (six) hours as needed for pain.    Dispense:  120 tablet    Refill:  0     Follow-up: Return in about 3 months (around 10/06/2015) for a follow-up visit.  Walker Kehr, MD

## 2015-07-08 NOTE — Assessment & Plan Note (Signed)
Percocet Potential benefits of a long term opioids use as well as potential risks (i.e. addiction risk, apnea etc) and complications (i.e. Somnolence, constipation and others) were explained to the patient and were aknowledged. 

## 2015-07-08 NOTE — Assessment & Plan Note (Signed)
On B12 

## 2015-07-08 NOTE — Assessment & Plan Note (Signed)
Percocet  Chronic due to renal stents, adhesions  Potential benefits of a long term opioids use as well as potential risks (i.e. addiction risk, apnea etc) and complications (i.e. Somnolence, constipation and others) were explained to the patient and were aknowledged. 

## 2015-07-08 NOTE — Assessment & Plan Note (Signed)
Monitor wt Remeron Labs

## 2015-08-31 DIAGNOSIS — Z466 Encounter for fitting and adjustment of urinary device: Secondary | ICD-10-CM | POA: Diagnosis not present

## 2015-08-31 DIAGNOSIS — C541 Malignant neoplasm of endometrium: Secondary | ICD-10-CM | POA: Diagnosis not present

## 2015-09-07 DIAGNOSIS — Z933 Colostomy status: Secondary | ICD-10-CM | POA: Diagnosis not present

## 2015-10-07 DIAGNOSIS — Z933 Colostomy status: Secondary | ICD-10-CM | POA: Diagnosis not present

## 2015-10-09 ENCOUNTER — Ambulatory Visit: Payer: Medicare Other | Admitting: Internal Medicine

## 2015-10-28 ENCOUNTER — Encounter: Payer: Self-pay | Admitting: Internal Medicine

## 2015-10-28 ENCOUNTER — Ambulatory Visit (INDEPENDENT_AMBULATORY_CARE_PROVIDER_SITE_OTHER): Payer: Medicare HMO | Admitting: Internal Medicine

## 2015-10-28 VITALS — BP 98/52 | HR 80 | Wt 100.0 lb

## 2015-10-28 DIAGNOSIS — G8929 Other chronic pain: Secondary | ICD-10-CM

## 2015-10-28 DIAGNOSIS — M5442 Lumbago with sciatica, left side: Secondary | ICD-10-CM

## 2015-10-28 DIAGNOSIS — F331 Major depressive disorder, recurrent, moderate: Secondary | ICD-10-CM | POA: Diagnosis not present

## 2015-10-28 DIAGNOSIS — R634 Abnormal weight loss: Secondary | ICD-10-CM

## 2015-10-28 DIAGNOSIS — R1084 Generalized abdominal pain: Secondary | ICD-10-CM

## 2015-10-28 DIAGNOSIS — E538 Deficiency of other specified B group vitamins: Secondary | ICD-10-CM

## 2015-10-28 DIAGNOSIS — R7989 Other specified abnormal findings of blood chemistry: Secondary | ICD-10-CM

## 2015-10-28 DIAGNOSIS — R69 Illness, unspecified: Secondary | ICD-10-CM | POA: Diagnosis not present

## 2015-10-28 DIAGNOSIS — R945 Abnormal results of liver function studies: Secondary | ICD-10-CM

## 2015-10-28 DIAGNOSIS — M5441 Lumbago with sciatica, right side: Secondary | ICD-10-CM

## 2015-10-28 NOTE — Progress Notes (Signed)
Pre visit review using our clinic review tool, if applicable. No additional management support is needed unless otherwise documented below in the visit note. 

## 2015-10-28 NOTE — Progress Notes (Signed)
Subjective:  Patient ID: [name], female    DOB: [birth year]  Age: 82 y.o. MRN: [id number]  CC: No chief complaint on file.   HPI [name] presents for abd pain, wt loss, insomnia f/u  Outpatient Prescriptions Prior to Visit  Medication Sig Dispense Refill  . Cholecalciferol (VITAMIN D3) 1000 UNITS tablet Take 1,000-2,000 Units by mouth 2 (two) times daily. 1,000 units at lunch and 2,000 units at dinner    . Cyanocobalamin (VITAMIN B-12 PO) Place 3,000 mcg under the tongue daily.    . ferrous sulfate 325 (65 FE) MG tablet Take 325 mg by mouth daily with breakfast.    . ibuprofen (ADVIL,MOTRIN) 600 MG tablet TAKE 1 TABLET (600 MG TOTAL) BY MOUTH EVERY 8 (EIGHT) HOURS AS NEEDED FOR MODERATE PAIN. FOR PAIN 90 tablet 0  . mirtazapine (REMERON) 15 MG tablet TAKE 1 TABLET AT BEDTIME 90 tablet 2  . oxyCODONE-acetaminophen (PERCOCET) 10-325 MG tablet Take 1 tablet by mouth every 6 (six) hours as needed for pain. 120 tablet 0  . polyethylene glycol powder (GLYCOLAX/MIRALAX) powder Take 255 g by mouth once. 255 g 5  . Probiotic Product (ALIGN) 4 MG CAPS Take 4 mg by mouth daily. For intestinal flora restoration    . temazepam (RESTORIL) 30 MG capsule Take 1 capsule (30 mg total) by mouth at bedtime as needed for sleep. 90 capsule 1   No facility-administered medications prior to visit.    ROS Review of Systems  Constitutional: Negative for chills, activity change, appetite change, fatigue and unexpected weight change.  HENT: Negative for congestion, mouth sores and sinus pressure.   Eyes: Negative for visual disturbance.  Respiratory: Negative for cough and chest tightness.   Gastrointestinal: Positive for abdominal pain. Negative for nausea.  Genitourinary: Negative for frequency, difficulty urinating and vaginal pain.  Musculoskeletal: Positive for back pain. Negative for gait problem.  Skin: Negative for pallor and rash.  Neurological: Negative for dizziness, tremors,  weakness, numbness and headaches.  Psychiatric/Behavioral: Positive for sleep disturbance. Negative for suicidal ideas and confusion. The patient is nervous/anxious.     Objective:  BP 98/52 mmHg  Pulse 80  Wt 100 lb (45.36 kg)  SpO2 97%  BP Readings from Last 3 Encounters:  10/28/15 98/52  07/08/15 100/70  04/01/15 112/70    Wt Readings from Last 3 Encounters:  10/28/15 100 lb (45.36 kg)  07/08/15 99 lb (44.906 kg)  04/01/15 102 lb (46.267 kg)    Physical Exam  Constitutional: She appears well-developed. No distress.  HENT:  Head: Normocephalic.  Right Ear: External ear normal.  Left Ear: External ear normal.  Nose: Nose normal.  Mouth/Throat: Oropharynx is clear and moist.  Eyes: Conjunctivae are normal. Pupils are equal, round, and reactive to light. Right eye exhibits no discharge. Left eye exhibits no discharge.  Neck: Normal range of motion. Neck supple. No JVD present. No tracheal deviation present. No thyromegaly present.  Cardiovascular: Normal rate, regular rhythm and normal heart sounds.   Pulmonary/Chest: No stridor. No respiratory distress. She has no wheezes.  Abdominal: Soft. Bowel sounds are normal. She exhibits no distension and no mass. There is tenderness. There is no rebound and no guarding.  Musculoskeletal: She exhibits no edema or tenderness.  Lymphadenopathy:    She has no cervical adenopathy.  Neurological: She displays normal reflexes. No cranial nerve deficit. She exhibits normal muscle tone. Coordination normal.  Skin: No rash noted. No erythema.  Psychiatric: She has a normal mood and  affect. Her behavior is normal. Judgment and thought content normal.  colostomy; catheter  Lab Results  Component Value Date   WBC 3.6* 04/01/2015   HGB 12.5 04/01/2015   HCT 37.8 04/01/2015   PLT 140.0* 04/01/2015   GLUCOSE 86 04/01/2015   CHOL 148 04/29/2010   TRIG 98.0 04/29/2010   HDL 58.50 04/29/2010   LDLCALC 70 04/29/2010   ALT 9 04/01/2015   AST  14 04/01/2015   NA 140 04/01/2015   K 4.4 04/01/2015   CL 108 04/01/2015   CREATININE 1.34* 04/01/2015   BUN 29* 04/01/2015   CO2 26 04/01/2015   TSH 3.87 09/24/2014   INR 0.96 03/05/2014    No results found.  Assessment & Plan:   There are no diagnoses linked to this encounter. I am having Ms. Durante maintain her ALIGN, cholecalciferol, Cyanocobalamin (VITAMIN B-12 PO), ferrous sulfate, mirtazapine, temazepam, polyethylene glycol powder, ibuprofen, and oxyCODONE-acetaminophen.  No orders of the defined types were placed in this encounter.     Follow-up: No Follow-up on file.  Walker Kehr, MD

## 2015-10-28 NOTE — Assessment & Plan Note (Signed)
Doing well 

## 2015-10-28 NOTE — Assessment & Plan Note (Signed)
On b12 

## 2015-10-28 NOTE — Assessment & Plan Note (Signed)
Chronic  Potential benefits of a long term opioids use as well as potential risks (i.e. addiction risk, apnea etc) and complications (i.e. Somnolence, constipation and others) were explained to the patient and were aknowledged.  Percocet   

## 2015-10-28 NOTE — Assessment & Plan Note (Signed)
Percocet  Chronic due to renal stents, adhesions  Potential benefits of a long term opioids use as well as potential risks (i.e. addiction risk, apnea etc) and complications (i.e. Somnolence, constipation and others) were explained to the patient and were aknowledged. 

## 2015-10-28 NOTE — Assessment & Plan Note (Signed)
labs

## 2015-11-10 DIAGNOSIS — Z933 Colostomy status: Secondary | ICD-10-CM | POA: Diagnosis not present

## 2015-11-17 DIAGNOSIS — Z85038 Personal history of other malignant neoplasm of large intestine: Secondary | ICD-10-CM | POA: Diagnosis not present

## 2015-11-17 DIAGNOSIS — Z436 Encounter for attention to other artificial openings of urinary tract: Secondary | ICD-10-CM | POA: Diagnosis not present

## 2015-11-17 DIAGNOSIS — C541 Malignant neoplasm of endometrium: Secondary | ICD-10-CM | POA: Diagnosis not present

## 2015-11-17 DIAGNOSIS — N131 Hydronephrosis with ureteral stricture, not elsewhere classified: Secondary | ICD-10-CM | POA: Diagnosis not present

## 2015-12-10 DIAGNOSIS — Z933 Colostomy status: Secondary | ICD-10-CM | POA: Diagnosis not present

## 2015-12-17 ENCOUNTER — Other Ambulatory Visit: Payer: Self-pay | Admitting: Internal Medicine

## 2015-12-30 ENCOUNTER — Other Ambulatory Visit: Payer: Self-pay | Admitting: Internal Medicine

## 2016-01-01 NOTE — Telephone Encounter (Signed)
rx phoned into [facility] via provider voicemail.  Contacted pt and informed same above.

## 2016-01-11 DIAGNOSIS — Z933 Colostomy status: Secondary | ICD-10-CM | POA: Diagnosis not present

## 2016-01-22 DIAGNOSIS — N1330 Unspecified hydronephrosis: Secondary | ICD-10-CM | POA: Diagnosis not present

## 2016-01-22 DIAGNOSIS — N131 Hydronephrosis with ureteral stricture, not elsewhere classified: Secondary | ICD-10-CM | POA: Diagnosis not present

## 2016-01-29 ENCOUNTER — Encounter: Payer: Self-pay | Admitting: Internal Medicine

## 2016-01-29 ENCOUNTER — Ambulatory Visit (INDEPENDENT_AMBULATORY_CARE_PROVIDER_SITE_OTHER): Payer: Medicare HMO | Admitting: Internal Medicine

## 2016-01-29 VITALS — BP 100/60 | HR 71 | Ht 64.0 in | Wt 98.0 lb

## 2016-01-29 DIAGNOSIS — E538 Deficiency of other specified B group vitamins: Secondary | ICD-10-CM | POA: Diagnosis not present

## 2016-01-29 DIAGNOSIS — M810 Age-related osteoporosis without current pathological fracture: Secondary | ICD-10-CM | POA: Diagnosis not present

## 2016-01-29 DIAGNOSIS — Z0000 Encounter for general adult medical examination without abnormal findings: Secondary | ICD-10-CM | POA: Insufficient documentation

## 2016-01-29 DIAGNOSIS — G8929 Other chronic pain: Secondary | ICD-10-CM

## 2016-01-29 DIAGNOSIS — M5441 Lumbago with sciatica, right side: Secondary | ICD-10-CM | POA: Diagnosis not present

## 2016-01-29 DIAGNOSIS — Z23 Encounter for immunization: Secondary | ICD-10-CM

## 2016-01-29 DIAGNOSIS — R1084 Generalized abdominal pain: Secondary | ICD-10-CM

## 2016-01-29 DIAGNOSIS — M5442 Lumbago with sciatica, left side: Secondary | ICD-10-CM

## 2016-01-29 NOTE — Progress Notes (Signed)
Pre visit review using our clinic review tool, if applicable. No additional management support is needed unless otherwise documented below in the visit note. 

## 2016-01-29 NOTE — Assessment & Plan Note (Signed)
Here for medicare wellness/physical  Diet: heart healthy  Physical activity: not sedentary  Depression/mood screen: negative  Hearing: intact to whispered voice  Visual acuity: grossly normal, performs annual eye exam  ADLs: capable  Fall risk: low  Home safety: good  Cognitive evaluation: intact to orientation, naming, recall and repetition  EOL planning: adv directives, full code/ I agree  I have personally reviewed and have noted  1. The patient's medical, surgical and social history  2. Their use of alcohol, tobacco or illicit drugs  3. Their current medications and supplements  4. The patient's functional ability including ADL's, fall risks, home safety risks and hearing or visual impairment.  5. Diet and physical activities  6. Evidence for depression or mood disorders 7. The roster of all physicians providing medical care to patient - is listed in the Snapshot section of the chart and reviewed today.    Today patient counseled on age appropriate routine health concerns for screening and prevention, each reviewed and up to date or declined. Immunizations reviewed and up to date or declined. Labs ordered. Risk factors for depression reviewed and negative. Hearing function and visual acuity are intact. ADLs screened and addressed as needed. Functional ability and level of safety reviewed and appropriate. Education, counseling and referrals performed based on assessed risks today. Patient provided with a copy of personalized plan for preventive services. Declined mammo. She'll irrigate R ear at home

## 2016-01-29 NOTE — Assessment & Plan Note (Signed)
On Vit D 

## 2016-01-29 NOTE — Patient Instructions (Signed)
Preventive Care for Adults, Female A healthy lifestyle and preventive care can promote health and wellness. Preventive health guidelines for women include the following key practices.  A routine yearly physical is a good way to check with your health care provider about your health and preventive screening. It is a chance to share any concerns and updates on your health and to receive a thorough exam.  Visit your dentist for a routine exam and preventive care every 6 months. Brush your teeth twice a day and floss once a day. Good oral hygiene prevents tooth decay and gum disease.  The frequency of eye exams is based on your age, health, family medical history, use of contact lenses, and other factors. Follow your health care provider's recommendations for frequency of eye exams.  Eat a healthy diet. Foods like vegetables, fruits, whole grains, low-fat dairy products, and lean protein foods contain the nutrients you need without too many calories. Decrease your intake of foods high in solid fats, added sugars, and salt. Eat the right amount of calories for you.Get information about a proper diet from your health care provider, if necessary.  Regular physical exercise is one of the most important things you can do for your health. Most adults should get at least 150 minutes of moderate-intensity exercise (any activity that increases your heart rate and causes you to sweat) each week. In addition, most adults need muscle-strengthening exercises on 2 or more days a week.  Maintain a healthy weight. The body mass index (BMI) is a screening tool to identify possible weight problems. It provides an estimate of body fat based on height and weight. Your health care provider can find your BMI and can help you achieve or maintain a healthy weight.For adults 20 years and older:  A BMI below 18.5 is considered underweight.  A BMI of 18.5 to 24.9 is normal.  A BMI of 25 to 29.9 is considered overweight.  A  BMI of 30 and above is considered obese.  Maintain normal blood lipids and cholesterol levels by exercising and minimizing your intake of saturated fat. Eat a balanced diet with plenty of fruit and vegetables. Blood tests for lipids and cholesterol should begin at age 45 and be repeated every 5 years. If your lipid or cholesterol levels are high, you are over 50, or you are at high risk for heart disease, you may need your cholesterol levels checked more frequently.Ongoing high lipid and cholesterol levels should be treated with medicines if diet and exercise are not working.  If you smoke, find out from your health care provider how to quit. If you do not use tobacco, do not start.  Lung cancer screening is recommended for adults aged 45-80 years who are at high risk for developing lung cancer because of a history of smoking. A yearly low-dose CT scan of the lungs is recommended for people who have at least a 30-pack-year history of smoking and are a current smoker or have quit within the past 15 years. A pack year of smoking is smoking an average of 1 pack of cigarettes a day for 1 year (for example: 1 pack a day for 30 years or 2 packs a day for 15 years). Yearly screening should continue until the smoker has stopped smoking for at least 15 years. Yearly screening should be stopped for people who develop a health problem that would prevent them from having lung cancer treatment.  If you are pregnant, do not drink alcohol. If you are  breastfeeding, be very cautious about drinking alcohol. If you are not pregnant and choose to drink alcohol, do not have more than 1 drink per day. One drink is considered to be 12 ounces (355 mL) of beer, 5 ounces (148 mL) of wine, or 1.5 ounces (44 mL) of liquor.  Avoid use of street drugs. Do not share needles with anyone. Ask for help if you need support or instructions about stopping the use of drugs.  High blood pressure causes heart disease and increases the risk  of stroke. Your blood pressure should be checked at least every 1 to 2 years. Ongoing high blood pressure should be treated with medicines if weight loss and exercise do not work.  If you are 55-79 years old, ask your health care provider if you should take aspirin to prevent strokes.  Diabetes screening is done by taking a blood sample to check your blood glucose level after you have not eaten for a certain period of time (fasting). If you are not overweight and you do not have risk factors for diabetes, you should be screened once every 3 years starting at age 45. If you are overweight or obese and you are 40-70 years of age, you should be screened for diabetes every year as part of your cardiovascular risk assessment.  Breast cancer screening is essential preventive care for women. You should practice "breast self-awareness." This means understanding the normal appearance and feel of your breasts and may include breast self-examination. Any changes detected, no matter how small, should be reported to a health care provider. Women in their 20s and 30s should have a clinical breast exam (CBE) by a health care provider as part of a regular health exam every 1 to 3 years. After age 40, women should have a CBE every year. Starting at age 40, women should consider having a mammogram (breast X-ray test) every year. Women who have a family history of breast cancer should talk to their health care provider about genetic screening. Women at a high risk of breast cancer should talk to their health care providers about having an MRI and a mammogram every year.  Breast cancer gene (BRCA)-related cancer risk assessment is recommended for women who have family members with BRCA-related cancers. BRCA-related cancers include breast, ovarian, tubal, and peritoneal cancers. Having family members with these cancers may be associated with an increased risk for harmful changes (mutations) in the breast cancer genes BRCA1 and  BRCA2. Results of the assessment will determine the need for genetic counseling and BRCA1 and BRCA2 testing.  Your health care provider may recommend that you be screened regularly for cancer of the pelvic organs (ovaries, uterus, and vagina). This screening involves a pelvic examination, including checking for microscopic changes to the surface of your cervix (Pap test). You may be encouraged to have this screening done every 3 years, beginning at age 21.  For women ages 30-65, health care providers may recommend pelvic exams and Pap testing every 3 years, or they may recommend the Pap and pelvic exam, combined with testing for human papilloma virus (HPV), every 5 years. Some types of HPV increase your risk of cervical cancer. Testing for HPV may also be done on women of any age with unclear Pap test results.  Other health care providers may not recommend any screening for nonpregnant women who are considered low risk for pelvic cancer and who do not have symptoms. Ask your health care provider if a screening pelvic exam is right for   you.  If you have had past treatment for cervical cancer or a condition that could lead to cancer, you need Pap tests and screening for cancer for at least 20 years after your treatment. If Pap tests have been discontinued, your risk factors (such as having a new sexual partner) need to be reassessed to determine if screening should resume. Some women have medical problems that increase the chance of getting cervical cancer. In these cases, your health care provider may recommend more frequent screening and Pap tests.  Colorectal cancer can be detected and often prevented. Most routine colorectal cancer screening begins at the age of 50 years and continues through age 75 years. However, your health care provider may recommend screening at an earlier age if you have risk factors for colon cancer. On a yearly basis, your health care provider may provide home test kits to check  for hidden blood in the stool. Use of a small camera at the end of a tube, to directly examine the colon (sigmoidoscopy or colonoscopy), can detect the earliest forms of colorectal cancer. Talk to your health care provider about this at age 50, when routine screening begins. Direct exam of the colon should be repeated every 5-10 years through age 75 years, unless early forms of precancerous polyps or small growths are found.  People who are at an increased risk for hepatitis B should be screened for this virus. You are considered at high risk for hepatitis B if:  You were born in a country where hepatitis B occurs often. Talk with your health care provider about which countries are considered high risk.  Your parents were born in a high-risk country and you have not received a shot to protect against hepatitis B (hepatitis B vaccine).  You have HIV or AIDS.  You use needles to inject street drugs.  You live with, or have sex with, someone who has hepatitis B.  You get hemodialysis treatment.  You take certain medicines for conditions like cancer, organ transplantation, and autoimmune conditions.  Hepatitis C blood testing is recommended for all people born from 1945 through 1965 and any individual with known risks for hepatitis C.  Practice safe sex. Use condoms and avoid high-risk sexual practices to reduce the spread of sexually transmitted infections (STIs). STIs include gonorrhea, chlamydia, syphilis, trichomonas, herpes, HPV, and human immunodeficiency virus (HIV). Herpes, HIV, and HPV are viral illnesses that have no cure. They can result in disability, cancer, and death.  You should be screened for sexually transmitted illnesses (STIs) including gonorrhea and chlamydia if:  You are sexually active and are younger than 24 years.  You are older than 24 years and your health care provider tells you that you are at risk for this type of infection.  Your sexual activity has changed  since you were last screened and you are at an increased risk for chlamydia or gonorrhea. Ask your health care provider if you are at risk.  If you are at risk of being infected with HIV, it is recommended that you take a prescription medicine daily to prevent HIV infection. This is called preexposure prophylaxis (PrEP). You are considered at risk if:  You are sexually active and do not regularly use condoms or know the HIV status of your partner(s).  You take drugs by injection.  You are sexually active with a partner who has HIV.  Talk with your health care provider about whether you are at high risk of being infected with HIV. If   you choose to begin PrEP, you should first be tested for HIV. You should then be tested every 3 months for as long as you are taking PrEP.  Osteoporosis is a disease in which the bones lose minerals and strength with aging. This can result in serious bone fractures or breaks. The risk of osteoporosis can be identified using a bone density scan. Women ages 67 years and over and women at risk for fractures or osteoporosis should discuss screening with their health care providers. Ask your health care provider whether you should take a calcium supplement or vitamin D to reduce the rate of osteoporosis.  Menopause can be associated with physical symptoms and risks. Hormone replacement therapy is available to decrease symptoms and risks. You should talk to your health care provider about whether hormone replacement therapy is right for you.  Use sunscreen. Apply sunscreen liberally and repeatedly throughout the day. You should seek shade when your shadow is shorter than you. Protect yourself by wearing long sleeves, pants, a wide-brimmed hat, and sunglasses year round, whenever you are outdoors.  Once a month, do a whole body skin exam, using a mirror to look at the skin on your back. Tell your health care provider of new moles, moles that have irregular borders, moles that  are larger than a pencil eraser, or moles that have changed in shape or color.  Stay current with required vaccines (immunizations).  Influenza vaccine. All adults should be immunized every year.  Tetanus, diphtheria, and acellular pertussis (Td, Tdap) vaccine. Pregnant women should receive 1 dose of Tdap vaccine during each pregnancy. The dose should be obtained regardless of the length of time since the last dose. Immunization is preferred during the 27th-36th week of gestation. An adult who has not previously received Tdap or who does not know her vaccine status should receive 1 dose of Tdap. This initial dose should be followed by tetanus and diphtheria toxoids (Td) booster doses every 10 years. Adults with an unknown or incomplete history of completing a 3-dose immunization series with Td-containing vaccines should begin or complete a primary immunization series including a Tdap dose. Adults should receive a Td booster every 10 years.  Varicella vaccine. An adult without evidence of immunity to varicella should receive 2 doses or a second dose if she has previously received 1 dose. Pregnant females who do not have evidence of immunity should receive the first dose after pregnancy. This first dose should be obtained before leaving the health care facility. The second dose should be obtained 4-8 weeks after the first dose.  Human papillomavirus (HPV) vaccine. Females aged 13-26 years who have not received the vaccine previously should obtain the 3-dose series. The vaccine is not recommended for use in pregnant females. However, pregnancy testing is not needed before receiving a dose. If a female is found to be pregnant after receiving a dose, no treatment is needed. In that case, the remaining doses should be delayed until after the pregnancy. Immunization is recommended for any person with an immunocompromised condition through the age of 61 years if she did not get any or all doses earlier. During the  3-dose series, the second dose should be obtained 4-8 weeks after the first dose. The third dose should be obtained 24 weeks after the first dose and 16 weeks after the second dose.  Zoster vaccine. One dose is recommended for adults aged 30 years or older unless certain conditions are present.  Measles, mumps, and rubella (MMR) vaccine. Adults born  before 1957 generally are considered immune to measles and mumps. Adults born in 1957 or later should have 1 or more doses of MMR vaccine unless there is a contraindication to the vaccine or there is laboratory evidence of immunity to each of the three diseases. A routine second dose of MMR vaccine should be obtained at least 28 days after the first dose for students attending postsecondary schools, health care workers, or international travelers. People who received inactivated measles vaccine or an unknown type of measles vaccine during 1963-1967 should receive 2 doses of MMR vaccine. People who received inactivated mumps vaccine or an unknown type of mumps vaccine before 1979 and are at high risk for mumps infection should consider immunization with 2 doses of MMR vaccine. For females of childbearing age, rubella immunity should be determined. If there is no evidence of immunity, females who are not pregnant should be vaccinated. If there is no evidence of immunity, females who are pregnant should delay immunization until after pregnancy. Unvaccinated health care workers born before 1957 who lack laboratory evidence of measles, mumps, or rubella immunity or laboratory confirmation of disease should consider measles and mumps immunization with 2 doses of MMR vaccine or rubella immunization with 1 dose of MMR vaccine.  Pneumococcal 13-valent conjugate (PCV13) vaccine. When indicated, a person who is uncertain of his immunization history and has no record of immunization should receive the PCV13 vaccine. All adults 65 years of age and older should receive this  vaccine. An adult aged 19 years or older who has certain medical conditions and has not been previously immunized should receive 1 dose of PCV13 vaccine. This PCV13 should be followed with a dose of pneumococcal polysaccharide (PPSV23) vaccine. Adults who are at high risk for pneumococcal disease should obtain the PPSV23 vaccine at least 8 weeks after the dose of PCV13 vaccine. Adults older than 80 years of age who have normal immune system function should obtain the PPSV23 vaccine dose at least 1 year after the dose of PCV13 vaccine.  Pneumococcal polysaccharide (PPSV23) vaccine. When PCV13 is also indicated, PCV13 should be obtained first. All adults aged 65 years and older should be immunized. An adult younger than age 65 years who has certain medical conditions should be immunized. Any person who resides in a nursing home or long-term care facility should be immunized. An adult smoker should be immunized. People with an immunocompromised condition and certain other conditions should receive both PCV13 and PPSV23 vaccines. People with human immunodeficiency virus (HIV) infection should be immunized as soon as possible after diagnosis. Immunization during chemotherapy or radiation therapy should be avoided. Routine use of PPSV23 vaccine is not recommended for American Indians, [place] Natives, or people younger than 65 years unless there are medical conditions that require PPSV23 vaccine. When indicated, people who have unknown immunization and have no record of immunization should receive PPSV23 vaccine. One-time revaccination 5 years after the first dose of PPSV23 is recommended for people aged 19-64 years who have chronic kidney failure, nephrotic syndrome, asplenia, or immunocompromised conditions. People who received 1-2 doses of PPSV23 before age 65 years should receive another dose of PPSV23 vaccine at age 65 years or later if at least 5 years have passed since the previous dose. Doses of PPSV23 are not  needed for people immunized with PPSV23 at or after age 65 years.  Meningococcal vaccine. Adults with asplenia or persistent complement component deficiencies should receive 2 doses of quadrivalent meningococcal conjugate (MenACWY-D) vaccine. The doses should be obtained   at least 2 months apart. Microbiologists working with certain meningococcal bacteria, [place] recruits, people at risk during an outbreak, and people who travel to or live in countries with a high rate of meningitis should be immunized. A first-year college student up through age 34 years who is living in a residence hall should receive a dose if she did not receive a dose on or after her 16th birthday. Adults who have certain high-risk conditions should receive one or more doses of vaccine.  Hepatitis A vaccine. Adults who wish to be protected from this disease, have certain high-risk conditions, work with hepatitis A-infected animals, work in hepatitis A research labs, or travel to or work in countries with a high rate of hepatitis A should be immunized. Adults who were previously unvaccinated and who anticipate close contact with an international adoptee during the first 60 days after arrival in the [place] from a country with a high rate of hepatitis A should be immunized.  Hepatitis B vaccine. Adults who wish to be protected from this disease, have certain high-risk conditions, may be exposed to blood or other infectious body fluids, are household contacts or sex partners of hepatitis B positive people, are clients or workers in certain care facilities, or travel to or work in countries with a high rate of hepatitis B should be immunized.  Haemophilus influenzae type b (Hib) vaccine. A previously unvaccinated person with asplenia or sickle cell disease or having a scheduled splenectomy should receive 1 dose of Hib vaccine. Regardless of previous immunization, a recipient of a hematopoietic stem cell transplant should receive a  3-dose series 6-12 months after her successful transplant. Hib vaccine is not recommended for adults with HIV infection. Preventive Services / Frequency Ages 35 to 4 years  Blood pressure check.** / Every 3-5 years.  Lipid and cholesterol check.** / Every 5 years beginning at age 60.  Clinical breast exam.** / Every 3 years for women in their 71s and 10s.  BRCA-related cancer risk assessment.** / For women who have family members with a BRCA-related cancer (breast, ovarian, tubal, or peritoneal cancers).  Pap test.** / Every 2 years from ages 76 through 26. Every 3 years starting at age 61 through age 76 or 93 with a history of 3 consecutive normal Pap tests.  HPV screening.** / Every 3 years from ages 37 through ages 60 to 51 with a history of 3 consecutive normal Pap tests.  Hepatitis C blood test.** / For any individual with known risks for hepatitis C.  Skin self-exam. / Monthly.  Influenza vaccine. / Every year.  Tetanus, diphtheria, and acellular pertussis (Tdap, Td) vaccine.** / Consult your health care provider. Pregnant women should receive 1 dose of Tdap vaccine during each pregnancy. 1 dose of Td every 10 years.  Varicella vaccine.** / Consult your health care provider. Pregnant females who do not have evidence of immunity should receive the first dose after pregnancy.  HPV vaccine. / 3 doses over 6 months, if 93 and younger. The vaccine is not recommended for use in pregnant females. However, pregnancy testing is not needed before receiving a dose.  Measles, mumps, rubella (MMR) vaccine.** / You need at least 1 dose of MMR if you were born in 1957 or later. You may also need a 2nd dose. For females of childbearing age, rubella immunity should be determined. If there is no evidence of immunity, females who are not pregnant should be vaccinated. If there is no evidence of immunity, females who are  pregnant should delay immunization until after pregnancy.  Pneumococcal  13-valent conjugate (PCV13) vaccine.** / Consult your health care provider.  Pneumococcal polysaccharide (PPSV23) vaccine.** / 1 to 2 doses if you smoke cigarettes or if you have certain conditions.  Meningococcal vaccine.** / 1 dose if you are age 68 to 8 years and a Market researcher living in a residence hall, or have one of several medical conditions, you need to get vaccinated against meningococcal disease. You may also need additional booster doses.  Hepatitis A vaccine.** / Consult your health care provider.  Hepatitis B vaccine.** / Consult your health care provider.  Haemophilus influenzae type b (Hib) vaccine.** / Consult your health care provider. Ages 7 to 53 years  Blood pressure check.** / Every year.  Lipid and cholesterol check.** / Every 5 years beginning at age 25 years.  Lung cancer screening. / Every year if you are aged 11-80 years and have a 30-pack-year history of smoking and currently smoke or have quit within the past 15 years. Yearly screening is stopped once you have quit smoking for at least 15 years or develop a health problem that would prevent you from having lung cancer treatment.  Clinical breast exam.** / Every year after age 48 years.  BRCA-related cancer risk assessment.** / For women who have family members with a BRCA-related cancer (breast, ovarian, tubal, or peritoneal cancers).  Mammogram.** / Every year beginning at age 41 years and continuing for as long as you are in good health. Consult with your health care provider.  Pap test.** / Every 3 years starting at age 65 years through age 37 or 70 years with a history of 3 consecutive normal Pap tests.  HPV screening.** / Every 3 years from ages 72 years through ages 60 to 40 years with a history of 3 consecutive normal Pap tests.  Fecal occult blood test (FOBT) of stool. / Every year beginning at age 21 years and continuing until age 5 years. You may not need to do this test if you get  a colonoscopy every 10 years.  Flexible sigmoidoscopy or colonoscopy.** / Every 5 years for a flexible sigmoidoscopy or every 10 years for a colonoscopy beginning at age 35 years and continuing until age 48 years.  Hepatitis C blood test.** / For all people born from [birth year] through 1965 and any individual with known risks for hepatitis C.  Skin self-exam. / Monthly.  Influenza vaccine. / Every year.  Tetanus, diphtheria, and acellular pertussis (Tdap/Td) vaccine.** / Consult your health care provider. Pregnant women should receive 1 dose of Tdap vaccine during each pregnancy. 1 dose of Td every 10 years.  Varicella vaccine.** / Consult your health care provider. Pregnant females who do not have evidence of immunity should receive the first dose after pregnancy.  Zoster vaccine.** / 1 dose for adults aged 30 years or older.  Measles, mumps, rubella (MMR) vaccine.** / You need at least 1 dose of MMR if you were born in 1957 or later. You may also need a second dose. For females of childbearing age, rubella immunity should be determined. If there is no evidence of immunity, females who are not pregnant should be vaccinated. If there is no evidence of immunity, females who are pregnant should delay immunization until after pregnancy.  Pneumococcal 13-valent conjugate (PCV13) vaccine.** / Consult your health care provider.  Pneumococcal polysaccharide (PPSV23) vaccine.** / 1 to 2 doses if you smoke cigarettes or if you have certain conditions.  Meningococcal vaccine.** /  Consult your health care provider.  Hepatitis A vaccine.** / Consult your health care provider.  Hepatitis B vaccine.** / Consult your health care provider.  Haemophilus influenzae type b (Hib) vaccine.** / Consult your health care provider. Ages 64 years and over  Blood pressure check.** / Every year.  Lipid and cholesterol check.** / Every 5 years beginning at age 23 years.  Lung cancer screening. / Every year if you  are aged 16-80 years and have a 30-pack-year history of smoking and currently smoke or have quit within the past 15 years. Yearly screening is stopped once you have quit smoking for at least 15 years or develop a health problem that would prevent you from having lung cancer treatment.  Clinical breast exam.** / Every year after age 74 years.  BRCA-related cancer risk assessment.** / For women who have family members with a BRCA-related cancer (breast, ovarian, tubal, or peritoneal cancers).  Mammogram.** / Every year beginning at age 44 years and continuing for as long as you are in good health. Consult with your health care provider.  Pap test.** / Every 3 years starting at age 58 years through age 22 or 39 years with 3 consecutive normal Pap tests. Testing can be stopped between 65 and 70 years with 3 consecutive normal Pap tests and no abnormal Pap or HPV tests in the past 10 years.  HPV screening.** / Every 3 years from ages 64 years through ages 70 or 61 years with a history of 3 consecutive normal Pap tests. Testing can be stopped between 65 and 70 years with 3 consecutive normal Pap tests and no abnormal Pap or HPV tests in the past 10 years.  Fecal occult blood test (FOBT) of stool. / Every year beginning at age 40 years and continuing until age 27 years. You may not need to do this test if you get a colonoscopy every 10 years.  Flexible sigmoidoscopy or colonoscopy.** / Every 5 years for a flexible sigmoidoscopy or every 10 years for a colonoscopy beginning at age 7 years and continuing until age 32 years.  Hepatitis C blood test.** / For all people born from [birth year] through 1963 and any individual with known risks for hepatitis C.  Osteoporosis screening.** / A one-time screening for women ages 30 years and over and women at risk for fractures or osteoporosis.  Skin self-exam. / Monthly.  Influenza vaccine. / Every year.  Tetanus, diphtheria, and acellular pertussis (Tdap/Td)  vaccine.** / 1 dose of Td every 10 years.  Varicella vaccine.** / Consult your health care provider.  Zoster vaccine.** / 1 dose for adults aged 35 years or older.  Pneumococcal 13-valent conjugate (PCV13) vaccine.** / Consult your health care provider.  Pneumococcal polysaccharide (PPSV23) vaccine.** / 1 dose for all adults aged 46 years and older.  Meningococcal vaccine.** / Consult your health care provider.  Hepatitis A vaccine.** / Consult your health care provider.  Hepatitis B vaccine.** / Consult your health care provider.  Haemophilus influenzae type b (Hib) vaccine.** / Consult your health care provider. ** Family history and personal history of risk and conditions may change your health care provider's recommendations.   This information is not intended to replace advice given to you by your health care provider. Make sure you discuss any questions you have with your health care provider.   Document Released: 09/20/2001 Document Revised: 08/15/2014 Document Reviewed: 12/20/2010 Elsevier Interactive Patient Education [employer].

## 2016-01-29 NOTE — Assessment & Plan Note (Signed)
Chronic  Potential benefits of a long term opioids use as well as potential risks (i.e. addiction risk, apnea etc) and complications (i.e. Somnolence, constipation and others) were explained to the patient and were aknowledged.  Percocet handwritten Rx was given (Pharmacy request)  #120 each x 3 mo 

## 2016-01-29 NOTE — Assessment & Plan Note (Signed)
On B12 

## 2016-01-29 NOTE — Progress Notes (Signed)
Subjective:  Patient ID: [name], female    DOB: [birth year]  Age: 82 y.o. MRN: [id number]  CC: No chief complaint on file.   HPI [name] presents for LBP, abd pain, fatigue, B12 def f/u. Well exam  Outpatient Prescriptions Prior to Visit  Medication Sig Dispense Refill  . Cholecalciferol (VITAMIN D3) 1000 UNITS tablet Take 1,000-2,000 Units by mouth 2 (two) times daily. 1,000 units at lunch and 2,000 units at dinner    . Cyanocobalamin (VITAMIN B-12 PO) Place 3,000 mcg under the tongue daily.    . ferrous sulfate 325 (65 FE) MG tablet Take 325 mg by mouth daily with breakfast.    . ibuprofen (ADVIL,MOTRIN) 600 MG tablet TAKE 1 TABLET (600 MG TOTAL) BY MOUTH EVERY 8 (EIGHT) HOURS AS NEEDED FOR MODERATE PAIN. FOR PAIN 90 tablet 0  . mirtazapine (REMERON) 15 MG tablet TAKE 1 TABLET AT BEDTIME 90 tablet 2  . oxyCODONE-acetaminophen (PERCOCET) 10-325 MG tablet Take 1 tablet by mouth every 6 (six) hours as needed for pain. 120 tablet 0  . polyethylene glycol powder (GLYCOLAX/MIRALAX) powder Take 255 g by mouth once. 255 g 5  . Probiotic Product (ALIGN) 4 MG CAPS Take 4 mg by mouth daily. For intestinal flora restoration    . temazepam (RESTORIL) 30 MG capsule TAKE ONE CAPSULE BY MOUTH AT BEDTIME AS NEEDED FOR SLEEP 90 capsule 1   No facility-administered medications prior to visit.    ROS Review of Systems  Constitutional: Positive for fatigue. Negative for chills, activity change, appetite change and unexpected weight change.  HENT: Negative for congestion, mouth sores and sinus pressure.   Eyes: Negative for visual disturbance.  Respiratory: Negative for cough and chest tightness.   Gastrointestinal: Positive for abdominal pain. Negative for nausea.  Genitourinary: Negative for frequency, difficulty urinating and vaginal pain.  Musculoskeletal: Positive for back pain. Negative for gait problem.  Skin: Negative for pallor and rash.  Neurological: Negative for dizziness,  tremors, weakness, numbness and headaches.  Psychiatric/Behavioral: Negative for confusion and sleep disturbance. The patient is nervous/anxious.     Objective:  BP 100/60 mmHg  Pulse 71  Ht 5\' 4"  (1.626 m)  Wt 98 lb (44.453 kg)  BMI 16.81 kg/m2  SpO2 95%  BP Readings from Last 3 Encounters:  01/29/16 100/60  10/28/15 98/52  07/08/15 100/70    Wt Readings from Last 3 Encounters:  01/29/16 98 lb (44.453 kg)  10/28/15 100 lb (45.36 kg)  07/08/15 99 lb (44.906 kg)    Physical Exam  Constitutional: She appears well-developed. No distress.  HENT:  Head: Normocephalic.  Right Ear: External ear normal.  Left Ear: External ear normal.  Nose: Nose normal.  Mouth/Throat: Oropharynx is clear and moist.  Eyes: Conjunctivae are normal. Pupils are equal, round, and reactive to light. Right eye exhibits no discharge. Left eye exhibits no discharge.  Neck: Normal range of motion. Neck supple. No JVD present. No tracheal deviation present. No thyromegaly present.  Cardiovascular: Normal rate, regular rhythm and normal heart sounds.   Pulmonary/Chest: No stridor. No respiratory distress. She has no wheezes.  Abdominal: Soft. Bowel sounds are normal. She exhibits no distension and no mass. There is no tenderness. There is no rebound and no guarding.  Musculoskeletal: She exhibits tenderness. She exhibits no edema.  Lymphadenopathy:    She has no cervical adenopathy.  Neurological: She displays normal reflexes. No cranial nerve deficit. She exhibits normal muscle tone. Coordination normal.  Skin: No rash noted.  No erythema.  Psychiatric: She has a normal mood and affect. Her behavior is normal. Judgment and thought content normal.  Thin Hands w/painful finger joints Wax R ear  Lab Results  Component Value Date   WBC 3.6* 04/01/2015   HGB 12.5 04/01/2015   HCT 37.8 04/01/2015   PLT 140.0* 04/01/2015   GLUCOSE 86 04/01/2015   CHOL 148 04/29/2010   TRIG 98.0 04/29/2010   HDL 58.50  04/29/2010   LDLCALC 70 04/29/2010   ALT 9 04/01/2015   AST 14 04/01/2015   NA 140 04/01/2015   K 4.4 04/01/2015   CL 108 04/01/2015   CREATININE 1.34* 04/01/2015   BUN 29* 04/01/2015   CO2 26 04/01/2015   TSH 3.87 09/24/2014   INR 0.96 03/05/2014    No results found.  Assessment & Plan:   There are no diagnoses linked to this encounter. I am having Ms. Senner maintain her ALIGN, cholecalciferol, Cyanocobalamin (VITAMIN B-12 PO), ferrous sulfate, polyethylene glycol powder, ibuprofen, oxyCODONE-acetaminophen, mirtazapine, temazepam, and diazepam.  Meds ordered this encounter  Medications  . diazepam (VALIUM) 5 MG tablet    Sig: as needed.     Follow-up: No Follow-up on file.  Walker Kehr, MD

## 2016-01-29 NOTE — Assessment & Plan Note (Signed)
Percocet  Chronic due to renal stents, adhesions  Potential benefits of a long term opioids use as well as potential risks (i.e. addiction risk, apnea etc) and complications (i.e. Somnolence, constipation and others) were explained to the patient and were aknowledged. 

## 2016-02-10 DIAGNOSIS — Z933 Colostomy status: Secondary | ICD-10-CM | POA: Diagnosis not present

## 2016-03-11 DIAGNOSIS — Z933 Colostomy status: Secondary | ICD-10-CM | POA: Diagnosis not present

## 2016-04-12 DIAGNOSIS — Z933 Colostomy status: Secondary | ICD-10-CM | POA: Diagnosis not present

## 2016-04-12 DIAGNOSIS — Z436 Encounter for attention to other artificial openings of urinary tract: Secondary | ICD-10-CM | POA: Diagnosis not present

## 2016-04-12 DIAGNOSIS — N131 Hydronephrosis with ureteral stricture, not elsewhere classified: Secondary | ICD-10-CM | POA: Diagnosis not present

## 2016-04-12 DIAGNOSIS — N135 Crossing vessel and stricture of ureter without hydronephrosis: Secondary | ICD-10-CM | POA: Diagnosis not present

## 2016-04-22 DIAGNOSIS — K632 Fistula of intestine: Secondary | ICD-10-CM | POA: Diagnosis not present

## 2016-04-22 DIAGNOSIS — R310 Gross hematuria: Secondary | ICD-10-CM | POA: Diagnosis not present

## 2016-04-28 DIAGNOSIS — H26493 Other secondary cataract, bilateral: Secondary | ICD-10-CM | POA: Diagnosis not present

## 2016-05-11 ENCOUNTER — Other Ambulatory Visit (INDEPENDENT_AMBULATORY_CARE_PROVIDER_SITE_OTHER): Payer: Medicare HMO

## 2016-05-11 ENCOUNTER — Encounter: Payer: Self-pay | Admitting: Internal Medicine

## 2016-05-11 ENCOUNTER — Ambulatory Visit (INDEPENDENT_AMBULATORY_CARE_PROVIDER_SITE_OTHER): Payer: Medicare HMO | Admitting: Internal Medicine

## 2016-05-11 DIAGNOSIS — R634 Abnormal weight loss: Secondary | ICD-10-CM | POA: Diagnosis not present

## 2016-05-11 DIAGNOSIS — G8929 Other chronic pain: Secondary | ICD-10-CM

## 2016-05-11 DIAGNOSIS — F4320 Adjustment disorder, unspecified: Secondary | ICD-10-CM

## 2016-05-11 DIAGNOSIS — E538 Deficiency of other specified B group vitamins: Secondary | ICD-10-CM

## 2016-05-11 DIAGNOSIS — M5441 Lumbago with sciatica, right side: Secondary | ICD-10-CM | POA: Diagnosis not present

## 2016-05-11 DIAGNOSIS — Z0000 Encounter for general adult medical examination without abnormal findings: Secondary | ICD-10-CM

## 2016-05-11 DIAGNOSIS — R69 Illness, unspecified: Secondary | ICD-10-CM | POA: Diagnosis not present

## 2016-05-11 DIAGNOSIS — R1084 Generalized abdominal pain: Secondary | ICD-10-CM

## 2016-05-11 DIAGNOSIS — M810 Age-related osteoporosis without current pathological fracture: Secondary | ICD-10-CM

## 2016-05-11 DIAGNOSIS — Z23 Encounter for immunization: Secondary | ICD-10-CM

## 2016-05-11 DIAGNOSIS — M5442 Lumbago with sciatica, left side: Secondary | ICD-10-CM

## 2016-05-11 DIAGNOSIS — F331 Major depressive disorder, recurrent, moderate: Secondary | ICD-10-CM | POA: Diagnosis not present

## 2016-05-11 DIAGNOSIS — F4321 Adjustment disorder with depressed mood: Secondary | ICD-10-CM | POA: Insufficient documentation

## 2016-05-11 LAB — CBC WITH DIFFERENTIAL/PLATELET
BASOS PCT: 0.4 % (ref 0.0–3.0)
Basophils Absolute: 0 10*3/uL (ref 0.0–0.1)
EOS PCT: 2.2 % (ref 0.0–5.0)
Eosinophils Absolute: 0.1 10*3/uL (ref 0.0–0.7)
HCT: 33.6 % — ABNORMAL LOW (ref 36.0–46.0)
HEMOGLOBIN: 11.3 g/dL — AB (ref 12.0–15.0)
LYMPHS ABS: 1 10*3/uL (ref 0.7–4.0)
LYMPHS PCT: 23.4 % (ref 12.0–46.0)
MCHC: 33.6 g/dL (ref 30.0–36.0)
MCV: 88.3 fl (ref 78.0–100.0)
MONO ABS: 0.3 10*3/uL (ref 0.1–1.0)
MONOS PCT: 7.3 % (ref 3.0–12.0)
NEUTROS ABS: 3 10*3/uL (ref 1.4–7.7)
NEUTROS PCT: 66.7 % (ref 43.0–77.0)
PLATELETS: 174 10*3/uL (ref 150.0–400.0)
RBC: 3.81 Mil/uL — ABNORMAL LOW (ref 3.87–5.11)
RDW: 15.9 % — AB (ref 11.5–15.5)
WBC: 4.4 10*3/uL (ref 4.0–10.5)

## 2016-05-11 LAB — BASIC METABOLIC PANEL
BUN: 22 mg/dL (ref 6–23)
CHLORIDE: 110 meq/L (ref 96–112)
CO2: 26 meq/L (ref 19–32)
CREATININE: 1.29 mg/dL — AB (ref 0.40–1.20)
Calcium: 9.5 mg/dL (ref 8.4–10.5)
GFR: 41.61 mL/min — ABNORMAL LOW (ref >60.00)
Glucose, Bld: 68 mg/dL — ABNORMAL LOW (ref 70–99)
POTASSIUM: 3.9 meq/L (ref 3.5–5.1)
SODIUM: 142 meq/L (ref 135–145)

## 2016-05-11 LAB — LIPID PANEL
CHOLESTEROL: 127 mg/dL (ref 0–200)
HDL: 58.6 mg/dL (ref >39.00)
LDL CALC: 49 mg/dL (ref 0–99)
NonHDL: 68.21
TRIGLYCERIDES: 94 mg/dL (ref 0.0–149.0)
Total CHOL/HDL Ratio: 2
VLDL: 18.8 mg/dL (ref 0.0–40.0)

## 2016-05-11 LAB — HEPATIC FUNCTION PANEL
ALT: 6 U/L (ref 0–35)
AST: 10 U/L (ref 0–37)
Albumin: 3.2 g/dL — ABNORMAL LOW (ref 3.5–5.2)
Alkaline Phosphatase: 89 U/L (ref 39–117)
BILIRUBIN TOTAL: 0.4 mg/dL (ref 0.2–1.2)
Bilirubin, Direct: 0 mg/dL (ref 0.0–0.3)
Total Protein: 6.6 g/dL (ref 6.0–8.3)

## 2016-05-11 LAB — TSH: TSH: 1.76 u[IU]/mL (ref 0.35–4.50)

## 2016-05-11 MED ORDER — MIRTAZAPINE 15 MG PO TABS: 15.0000 mg | ORAL_TABLET | Freq: Every day | ORAL | 1 refills | Status: DC

## 2016-05-11 MED ORDER — TEMAZEPAM 30 MG PO CAPS: 30.0000 mg | ORAL_CAPSULE | Freq: Every evening | ORAL | 1 refills | Status: DC | PRN

## 2016-05-11 NOTE — Assessment & Plan Note (Signed)
On B12 

## 2016-05-11 NOTE — Assessment & Plan Note (Signed)
04-22-2023 husband died - grieving

## 2016-05-11 NOTE — Assessment & Plan Note (Signed)
Percocet  Chronic due to renal stents, adhesions  Potential benefits of a long term opioids use as well as potential risks (i.e. addiction risk, apnea etc) and complications (i.e. Somnolence, constipation and others) were explained to the patient and were aknowledged. 

## 2016-05-11 NOTE — Progress Notes (Signed)
Pre visit review using our clinic review tool, if applicable. No additional management support is needed unless otherwise documented below in the visit note. 

## 2016-05-11 NOTE — Progress Notes (Signed)
Subjective:  Patient ID: [name], female    DOB: 1927-09-29  Age: 82 y.o. MRN: [id number]  CC: No chief complaint on file.   HPI [name] presents for chronic pain, B12 def, depression f/u  Outpatient Medications Prior to Visit  Medication Sig Dispense Refill  . Cholecalciferol (VITAMIN D3) 1000 UNITS tablet Take 1,000-2,000 Units by mouth 2 (two) times daily. 1,000 units at lunch and 2,000 units at dinner    . Cyanocobalamin (VITAMIN B-12 PO) Place 3,000 mcg under the tongue daily.    . diazepam (VALIUM) 5 MG tablet as needed.    . ferrous sulfate 325 (65 FE) MG tablet Take 325 mg by mouth daily with breakfast.    . ibuprofen (ADVIL,MOTRIN) 600 MG tablet TAKE 1 TABLET (600 MG TOTAL) BY MOUTH EVERY 8 (EIGHT) HOURS AS NEEDED FOR MODERATE PAIN. FOR PAIN 90 tablet 0  . mirtazapine (REMERON) 15 MG tablet TAKE 1 TABLET AT BEDTIME 90 tablet 2  . oxyCODONE-acetaminophen (PERCOCET) 10-325 MG tablet Take 1 tablet by mouth every 6 (six) hours as needed for pain. 120 tablet 0  . polyethylene glycol powder (GLYCOLAX/MIRALAX) powder Take 255 g by mouth once. 255 g 5  . Probiotic Product (ALIGN) 4 MG CAPS Take 4 mg by mouth daily. For intestinal flora restoration    . temazepam (RESTORIL) 30 MG capsule TAKE ONE CAPSULE BY MOUTH AT BEDTIME AS NEEDED FOR SLEEP 90 capsule 1   No facility-administered medications prior to visit.     ROS Review of Systems  Constitutional: Negative for activity change, appetite change, chills, fatigue and unexpected weight change.  HENT: Negative for congestion, mouth sores and sinus pressure.   Eyes: Negative for visual disturbance.  Respiratory: Negative for cough and chest tightness.   Gastrointestinal: Positive for abdominal pain. Negative for nausea.  Genitourinary: Negative for difficulty urinating, frequency and vaginal pain.  Musculoskeletal: Positive for arthralgias, back pain and gait problem.  Skin: Negative for pallor and rash.    Neurological: Negative for dizziness, tremors, weakness, numbness and headaches.  Psychiatric/Behavioral: Positive for dysphoric mood and sleep disturbance. Negative for confusion. The patient is nervous/anxious.     Objective:  BP 108/62   Pulse 60   Temp 97.5 F (36.4 C) (Oral)   Wt 99 lb (44.9 kg)   SpO2 90%   BMI 16.99 kg/m   BP Readings from Last 3 Encounters:  05/11/16 108/62  01/29/16 100/60  10/28/15 (!) 98/52    Wt Readings from Last 3 Encounters:  05/11/16 99 lb (44.9 kg)  01/29/16 98 lb (44.5 kg)  10/28/15 100 lb (45.4 kg)    Physical Exam  Constitutional: She appears well-developed. No distress.  HENT:  Head: Normocephalic.  Right Ear: External ear normal.  Left Ear: External ear normal.  Nose: Nose normal.  Mouth/Throat: Oropharynx is clear and moist.  Eyes: Conjunctivae are normal. Pupils are equal, round, and reactive to light. Right eye exhibits no discharge. Left eye exhibits no discharge.  Neck: Normal range of motion. Neck supple. No JVD present. No tracheal deviation present. No thyromegaly present.  Cardiovascular: Normal rate, regular rhythm and normal heart sounds.   Pulmonary/Chest: No stridor. No respiratory distress. She has no wheezes.  Abdominal: Soft. Bowel sounds are normal. She exhibits no distension and no mass. There is no tenderness. There is no rebound and no guarding.  Musculoskeletal: She exhibits no edema or tenderness.  Lymphadenopathy:    She has no cervical adenopathy.  Neurological: She displays  normal reflexes. No cranial nerve deficit. She exhibits normal muscle tone. Coordination normal.  Skin: No rash noted. No erythema.  Psychiatric: Judgment and thought content normal.  Sad Abd is sensitive Stomas in place  Lab Results  Component Value Date   WBC 3.6 (L) 04/01/2015   HGB 12.5 04/01/2015   HCT 37.8 04/01/2015   PLT 140.0 (L) 04/01/2015   GLUCOSE 86 04/01/2015   CHOL 148 04/29/2010   TRIG 98.0 04/29/2010   HDL  58.50 04/29/2010   LDLCALC 70 04/29/2010   ALT 9 04/01/2015   AST 14 04/01/2015   NA 140 04/01/2015   K 4.4 04/01/2015   CL 108 04/01/2015   CREATININE 1.34 (H) 04/01/2015   BUN 29 (H) 04/01/2015   CO2 26 04/01/2015   TSH 3.87 09/24/2014   INR 0.96 03/05/2014    No results found.  Assessment & Plan:   There are no diagnoses linked to this encounter. I am having Ms. Kaczmarek maintain her ALIGN, cholecalciferol, Cyanocobalamin (VITAMIN B-12 PO), ferrous sulfate, polyethylene glycol powder, ibuprofen, oxyCODONE-acetaminophen, mirtazapine, temazepam, and diazepam.  No orders of the defined types were placed in this encounter.    Follow-up: No Follow-up on file.  Walker Kehr, MD

## 2016-05-11 NOTE — Addendum Note (Signed)
Addended by: Cresenciano Lick on: 05/11/2016 10:32 AM   Modules accepted: Orders

## 2016-05-11 NOTE — Assessment & Plan Note (Signed)
Wt Readings from Last 3 Encounters:  05/11/16 99 lb (44.9 kg)  01/29/16 98 lb (44.5 kg)  10/28/15 100 lb (45.4 kg)

## 2016-05-11 NOTE — Assessment & Plan Note (Signed)
Pt's husband died - were married for 62 years

## 2016-05-13 DIAGNOSIS — Z933 Colostomy status: Secondary | ICD-10-CM | POA: Diagnosis not present

## 2016-05-23 DIAGNOSIS — N131 Hydronephrosis with ureteral stricture, not elsewhere classified: Secondary | ICD-10-CM | POA: Diagnosis not present

## 2016-05-23 DIAGNOSIS — N824 Other female intestinal-genital tract fistulae: Secondary | ICD-10-CM | POA: Diagnosis not present

## 2016-05-23 DIAGNOSIS — N3945 Continuous leakage: Secondary | ICD-10-CM | POA: Diagnosis not present

## 2016-06-07 DIAGNOSIS — N131 Hydronephrosis with ureteral stricture, not elsewhere classified: Secondary | ICD-10-CM | POA: Diagnosis not present

## 2016-06-07 DIAGNOSIS — Z436 Encounter for attention to other artificial openings of urinary tract: Secondary | ICD-10-CM | POA: Diagnosis not present

## 2016-06-07 DIAGNOSIS — C541 Malignant neoplasm of endometrium: Secondary | ICD-10-CM | POA: Diagnosis not present

## 2016-06-13 DIAGNOSIS — Z933 Colostomy status: Secondary | ICD-10-CM | POA: Diagnosis not present

## 2016-07-13 DIAGNOSIS — Z933 Colostomy status: Secondary | ICD-10-CM | POA: Diagnosis not present

## 2016-08-11 ENCOUNTER — Encounter: Payer: Self-pay | Admitting: Internal Medicine

## 2016-08-11 ENCOUNTER — Ambulatory Visit (INDEPENDENT_AMBULATORY_CARE_PROVIDER_SITE_OTHER): Payer: Medicare HMO | Admitting: Internal Medicine

## 2016-08-11 DIAGNOSIS — R69 Illness, unspecified: Secondary | ICD-10-CM | POA: Diagnosis not present

## 2016-08-11 DIAGNOSIS — M810 Age-related osteoporosis without current pathological fracture: Secondary | ICD-10-CM | POA: Diagnosis not present

## 2016-08-11 DIAGNOSIS — E538 Deficiency of other specified B group vitamins: Secondary | ICD-10-CM

## 2016-08-11 DIAGNOSIS — R634 Abnormal weight loss: Secondary | ICD-10-CM | POA: Diagnosis not present

## 2016-08-11 DIAGNOSIS — F331 Major depressive disorder, recurrent, moderate: Secondary | ICD-10-CM | POA: Diagnosis not present

## 2016-08-11 NOTE — Assessment & Plan Note (Signed)
On B12 

## 2016-08-11 NOTE — Assessment & Plan Note (Signed)
Wt Readings from Last 3 Encounters:  08/11/16 102 lb 6.4 oz (46.4 kg)  05/11/16 99 lb (44.9 kg)  01/29/16 98 lb (44.5 kg)

## 2016-08-11 NOTE — Assessment & Plan Note (Signed)
Vit D 

## 2016-08-11 NOTE — Progress Notes (Signed)
Pre visit review using our clinic review tool, if applicable. No additional management support is needed unless otherwise documented below in the visit note. 

## 2016-08-11 NOTE — Assessment & Plan Note (Signed)
son w/acute leukemia- discussed

## 2016-08-11 NOTE — Progress Notes (Signed)
Subjective:  Patient ID: [name], female    DOB: 01/20/1928  Age: 83 y.o. MRN: [id number]  CC: Follow-up (3 month- Req refill on pain med)   HPI [place] presents for LBP/abd pain, B12 def, depression f/u. Son has leukemia - stressed  Outpatient Medications Prior to Visit  Medication Sig Dispense Refill  . Cholecalciferol (VITAMIN D3) 1000 UNITS tablet Take 1,000-2,000 Units by mouth 2 (two) times daily. 1,000 units at lunch and 2,000 units at dinner    . Cyanocobalamin (VITAMIN B-12 PO) Place 3,000 mcg under the tongue daily.    . diazepam (VALIUM) 5 MG tablet as needed.    . ferrous sulfate 325 (65 FE) MG tablet Take 325 mg by mouth daily with breakfast.    . ibuprofen (ADVIL,MOTRIN) 600 MG tablet TAKE 1 TABLET (600 MG TOTAL) BY MOUTH EVERY 8 (EIGHT) HOURS AS NEEDED FOR MODERATE PAIN. FOR PAIN 90 tablet 0  . mirtazapine (REMERON) 15 MG tablet Take 1 tablet (15 mg total) by mouth at bedtime. 90 tablet 1  . oxyCODONE-acetaminophen (PERCOCET) 10-325 MG tablet Take 1 tablet by mouth every 6 (six) hours as needed for pain. 120 tablet 0  . polyethylene glycol powder (GLYCOLAX/MIRALAX) powder Take 255 g by mouth once. 255 g 5  . Probiotic Product (ALIGN) 4 MG CAPS Take 4 mg by mouth daily. For intestinal flora restoration    . temazepam (RESTORIL) 30 MG capsule Take 1 capsule (30 mg total) by mouth at bedtime as needed. for sleep 90 capsule 1   No facility-administered medications prior to visit.     ROS Review of Systems  Constitutional: Positive for fatigue. Negative for activity change, appetite change, chills and unexpected weight change.  HENT: Negative for congestion, mouth sores and sinus pressure.   Eyes: Negative for visual disturbance.  Respiratory: Negative for cough and chest tightness.   Gastrointestinal: Positive for abdominal pain. Negative for nausea.  Genitourinary: Negative for difficulty urinating, frequency and vaginal pain.  Musculoskeletal: Positive  for back pain. Negative for gait problem.  Skin: Negative for pallor and rash.  Neurological: Negative for dizziness, tremors, weakness, numbness and headaches.  Psychiatric/Behavioral: Negative for confusion and sleep disturbance. The patient is nervous/anxious.     Objective:  BP 140/62 (BP Location: Left Arm)   Pulse 97   Temp 98.2 F (36.8 C) (Oral)   Wt 102 lb 6.4 oz (46.4 kg)   SpO2 97%   BMI 17.58 kg/m   BP Readings from Last 3 Encounters:  08/11/16 140/62  05/11/16 108/62  01/29/16 100/60    Wt Readings from Last 3 Encounters:  08/11/16 102 lb 6.4 oz (46.4 kg)  05/11/16 99 lb (44.9 kg)  01/29/16 98 lb (44.5 kg)    Physical Exam  Constitutional: She appears well-developed. No distress.  HENT:  Head: Normocephalic.  Right Ear: External ear normal.  Left Ear: External ear normal.  Nose: Nose normal.  Mouth/Throat: Oropharynx is clear and moist.  Eyes: Conjunctivae are normal. Pupils are equal, round, and reactive to light. Right eye exhibits no discharge. Left eye exhibits no discharge.  Neck: Normal range of motion. Neck supple. No JVD present. No tracheal deviation present. No thyromegaly present.  Cardiovascular: Normal rate, regular rhythm and normal heart sounds.   Pulmonary/Chest: No stridor. No respiratory distress. She has no wheezes.  Abdominal: Soft. Bowel sounds are normal. She exhibits no distension and no mass. There is tenderness. There is no rebound and no guarding.  Musculoskeletal: She  exhibits tenderness. She exhibits no edema.  Lymphadenopathy:    She has no cervical adenopathy.  Neurological: She displays normal reflexes. No cranial nerve deficit. She exhibits normal muscle tone. Coordination normal.  Skin: No rash noted. No erythema.  Psychiatric: She has a normal mood and affect. Her behavior is normal. Judgment and thought content normal.  Thin  Lab Results  Component Value Date   WBC 4.4 05/11/2016   HGB 11.3 (L) 05/11/2016   HCT 33.6  (L) 05/11/2016   PLT 174.0 05/11/2016   GLUCOSE 68 (L) 05/11/2016   CHOL 127 05/11/2016   TRIG 94.0 05/11/2016   HDL 58.60 05/11/2016   LDLCALC 49 05/11/2016   ALT 6 05/11/2016   AST 10 05/11/2016   NA 142 05/11/2016   K 3.9 05/11/2016   CL 110 05/11/2016   CREATININE 1.29 (H) 05/11/2016   BUN 22 05/11/2016   CO2 26 05/11/2016   TSH 1.76 05/11/2016   INR 0.96 03/05/2014    No results found.  Assessment & Plan:   There are no diagnoses linked to this encounter. I am having Ms. Bowring maintain her ALIGN, cholecalciferol, Cyanocobalamin (VITAMIN B-12 PO), ferrous sulfate, polyethylene glycol powder, ibuprofen, oxyCODONE-acetaminophen, diazepam, mirtazapine, and temazepam.  No orders of the defined types were placed in this encounter.    Follow-up: No Follow-up on file.  Walker Kehr, MD

## 2016-08-15 DIAGNOSIS — N131 Hydronephrosis with ureteral stricture, not elsewhere classified: Secondary | ICD-10-CM | POA: Diagnosis not present

## 2016-08-15 DIAGNOSIS — Z436 Encounter for attention to other artificial openings of urinary tract: Secondary | ICD-10-CM | POA: Diagnosis not present

## 2016-09-12 DIAGNOSIS — Z933 Colostomy status: Secondary | ICD-10-CM | POA: Diagnosis not present

## 2016-10-12 DIAGNOSIS — Z933 Colostomy status: Secondary | ICD-10-CM | POA: Diagnosis not present

## 2016-10-14 DIAGNOSIS — L700 Acne vulgaris: Secondary | ICD-10-CM | POA: Diagnosis not present

## 2016-10-14 DIAGNOSIS — L570 Actinic keratosis: Secondary | ICD-10-CM | POA: Diagnosis not present

## 2016-10-24 DIAGNOSIS — Z436 Encounter for attention to other artificial openings of urinary tract: Secondary | ICD-10-CM | POA: Diagnosis not present

## 2016-10-24 DIAGNOSIS — N131 Hydronephrosis with ureteral stricture, not elsewhere classified: Secondary | ICD-10-CM | POA: Diagnosis not present

## 2016-11-09 ENCOUNTER — Encounter: Payer: Self-pay | Admitting: Internal Medicine

## 2016-11-09 ENCOUNTER — Ambulatory Visit (INDEPENDENT_AMBULATORY_CARE_PROVIDER_SITE_OTHER): Payer: Medicare HMO | Admitting: Internal Medicine

## 2016-11-09 ENCOUNTER — Other Ambulatory Visit (INDEPENDENT_AMBULATORY_CARE_PROVIDER_SITE_OTHER): Payer: Medicare HMO

## 2016-11-09 DIAGNOSIS — M5441 Lumbago with sciatica, right side: Secondary | ICD-10-CM | POA: Diagnosis not present

## 2016-11-09 DIAGNOSIS — E559 Vitamin D deficiency, unspecified: Secondary | ICD-10-CM

## 2016-11-09 DIAGNOSIS — M5442 Lumbago with sciatica, left side: Secondary | ICD-10-CM | POA: Diagnosis not present

## 2016-11-09 DIAGNOSIS — R1084 Generalized abdominal pain: Secondary | ICD-10-CM

## 2016-11-09 DIAGNOSIS — G8929 Other chronic pain: Secondary | ICD-10-CM | POA: Diagnosis not present

## 2016-11-09 DIAGNOSIS — E538 Deficiency of other specified B group vitamins: Secondary | ICD-10-CM

## 2016-11-09 DIAGNOSIS — F331 Major depressive disorder, recurrent, moderate: Secondary | ICD-10-CM

## 2016-11-09 DIAGNOSIS — R69 Illness, unspecified: Secondary | ICD-10-CM | POA: Diagnosis not present

## 2016-11-09 LAB — CBC WITH DIFFERENTIAL/PLATELET
BASOS PCT: 0.3 % (ref 0.0–3.0)
Basophils Absolute: 0 10*3/uL (ref 0.0–0.1)
EOS PCT: 3.2 % (ref 0.0–5.0)
Eosinophils Absolute: 0.2 10*3/uL (ref 0.0–0.7)
HCT: 33.3 % — ABNORMAL LOW (ref 36.0–46.0)
Hemoglobin: 10.7 g/dL — ABNORMAL LOW (ref 12.0–15.0)
LYMPHS ABS: 1 10*3/uL (ref 0.7–4.0)
Lymphocytes Relative: 18.6 % (ref 12.0–46.0)
MCHC: 32 g/dL (ref 30.0–36.0)
MCV: 87 fl (ref 78.0–100.0)
MONO ABS: 0.3 10*3/uL (ref 0.1–1.0)
Monocytes Relative: 6.2 % (ref 3.0–12.0)
NEUTROS ABS: 3.7 10*3/uL (ref 1.4–7.7)
NEUTROS PCT: 71.7 % (ref 43.0–77.0)
Platelets: 219 10*3/uL (ref 150.0–400.0)
RBC: 3.82 Mil/uL — ABNORMAL LOW (ref 3.87–5.11)
RDW: 15.4 % (ref 11.5–15.5)
WBC: 5.2 10*3/uL (ref 4.0–10.5)

## 2016-11-09 LAB — BASIC METABOLIC PANEL
BUN: 21 mg/dL (ref 6–23)
CALCIUM: 9.9 mg/dL (ref 8.4–10.5)
CO2: 25 mEq/L (ref 19–32)
Chloride: 108 mEq/L (ref 96–112)
Creatinine, Ser: 1.37 mg/dL — ABNORMAL HIGH (ref 0.40–1.20)
GFR: 38.77 mL/min — ABNORMAL LOW (ref >60.00)
GLUCOSE: 95 mg/dL (ref 70–99)
Potassium: 4.1 mEq/L (ref 3.5–5.1)
SODIUM: 137 meq/L (ref 135–145)

## 2016-11-09 LAB — HEPATIC FUNCTION PANEL
ALK PHOS: 96 U/L (ref 39–117)
ALT: 8 U/L (ref 0–35)
AST: 11 U/L (ref 0–37)
Albumin: 3.4 g/dL — ABNORMAL LOW (ref 3.5–5.2)
BILIRUBIN TOTAL: 0.3 mg/dL (ref 0.2–1.2)
Bilirubin, Direct: 0 mg/dL (ref 0.0–0.3)
Total Protein: 6.8 g/dL (ref 6.0–8.3)

## 2016-11-09 MED ORDER — TEMAZEPAM 30 MG PO CAPS: 30.0000 mg | ORAL_CAPSULE | Freq: Every evening | ORAL | 1 refills | Status: DC | PRN

## 2016-11-09 MED ORDER — MIRTAZAPINE 15 MG PO TABS: 15.0000 mg | ORAL_TABLET | Freq: Every day | ORAL | 1 refills | Status: DC

## 2016-11-09 NOTE — Assessment & Plan Note (Signed)
On Vit D 

## 2016-11-09 NOTE — Assessment & Plan Note (Signed)
On B12 

## 2016-11-09 NOTE — Progress Notes (Signed)
Subjective:  Patient ID: [name], female    DOB: May 25, 1928  Age: 83 y.o. MRN: [id number]  CC: No chief complaint on file.   HPI [name] presents for abd pain, B12 def, insomnia Grieving her son's death   Outpatient Medications Prior to Visit  Medication Sig Dispense Refill  . Cholecalciferol (VITAMIN D3) 1000 UNITS tablet Take 1,000-2,000 Units by mouth 2 (two) times daily. 1,000 units at lunch and 2,000 units at dinner    . Cyanocobalamin (VITAMIN B-12 PO) Place 3,000 mcg under the tongue daily.    . diazepam (VALIUM) 5 MG tablet as needed.    . ferrous sulfate 325 (65 FE) MG tablet Take 325 mg by mouth daily with breakfast.    . ibuprofen (ADVIL,MOTRIN) 600 MG tablet TAKE 1 TABLET (600 MG TOTAL) BY MOUTH EVERY 8 (EIGHT) HOURS AS NEEDED FOR MODERATE PAIN. FOR PAIN 90 tablet 0  . mirtazapine (REMERON) 15 MG tablet Take 1 tablet (15 mg total) by mouth at bedtime. 90 tablet 1  . oxyCODONE-acetaminophen (PERCOCET) 10-325 MG tablet Take 1 tablet by mouth every 6 (six) hours as needed for pain. 120 tablet 0  . polyethylene glycol powder (GLYCOLAX/MIRALAX) powder Take 255 g by mouth once. 255 g 5  . Probiotic Product (ALIGN) 4 MG CAPS Take 4 mg by mouth daily. For intestinal flora restoration    . temazepam (RESTORIL) 30 MG capsule Take 1 capsule (30 mg total) by mouth at bedtime as needed. for sleep 90 capsule 1   No facility-administered medications prior to visit.     ROS Review of Systems  Constitutional: Positive for diaphoresis. Negative for activity change, appetite change, chills, fatigue and unexpected weight change.  HENT: Negative for congestion, mouth sores and sinus pressure.   Eyes: Negative for visual disturbance.  Respiratory: Negative for cough and chest tightness.   Gastrointestinal: Positive for abdominal pain. Negative for nausea.  Genitourinary: Negative for difficulty urinating, frequency and vaginal pain.  Musculoskeletal: Positive for back pain.  Negative for gait problem.  Skin: Negative for pallor and rash.  Neurological: Negative for dizziness, tremors, weakness, numbness and headaches.  Psychiatric/Behavioral: Positive for dysphoric mood. Negative for confusion, sleep disturbance and suicidal ideas.    Objective:  BP 110/72   Pulse 89   Temp 98.1 F (36.7 C)   Ht 5\' 4"  (1.626 m)   Wt 98 lb (44.5 kg)   SpO2 98%   BMI 16.82 kg/m   BP Readings from Last 3 Encounters:  11/09/16 110/72  08/11/16 140/62  05/11/16 108/62    Wt Readings from Last 3 Encounters:  11/09/16 98 lb (44.5 kg)  08/11/16 102 lb 6.4 oz (46.4 kg)  05/11/16 99 lb (44.9 kg)    Physical Exam  Constitutional: She appears well-developed. No distress.  HENT:  Head: Normocephalic.  Right Ear: External ear normal.  Left Ear: External ear normal.  Nose: Nose normal.  Mouth/Throat: Oropharynx is clear and moist.  Eyes: Conjunctivae are normal. Pupils are equal, round, and reactive to light. Right eye exhibits no discharge. Left eye exhibits no discharge.  Neck: Normal range of motion. Neck supple. No JVD present. No tracheal deviation present. No thyromegaly present.  Cardiovascular: Normal rate, regular rhythm and normal heart sounds.   Pulmonary/Chest: No stridor. No respiratory distress. She has no wheezes.  Abdominal: Soft. Bowel sounds are normal. She exhibits no distension and no mass. There is tenderness. There is no rebound and no guarding.  Musculoskeletal: She exhibits tenderness.  She exhibits no edema.  Lymphadenopathy:    She has no cervical adenopathy.  Neurological: She displays normal reflexes. No cranial nerve deficit. She exhibits normal muscle tone. Coordination normal.  Skin: No rash noted. No erythema.  Psychiatric: Her behavior is normal. Judgment and thought content normal.  sad  Lab Results  Component Value Date   WBC 4.4 05/11/2016   HGB 11.3 (L) 05/11/2016   HCT 33.6 (L) 05/11/2016   PLT 174.0 05/11/2016   GLUCOSE 68  (L) 05/11/2016   CHOL 127 05/11/2016   TRIG 94.0 05/11/2016   HDL 58.60 05/11/2016   LDLCALC 49 05/11/2016   ALT 6 05/11/2016   AST 10 05/11/2016   NA 142 05/11/2016   K 3.9 05/11/2016   CL 110 05/11/2016   CREATININE 1.29 (H) 05/11/2016   BUN 22 05/11/2016   CO2 26 05/11/2016   TSH 1.76 05/11/2016   INR 0.96 03/05/2014    No results found.  Assessment & Plan:   There are no diagnoses linked to this encounter. I am having Ms. [place] maintain her ALIGN, cholecalciferol, Cyanocobalamin (VITAMIN B-12 PO), ferrous sulfate, polyethylene glycol powder, ibuprofen, oxyCODONE-acetaminophen, diazepam, mirtazapine, and temazepam.  No orders of the defined types were placed in this encounter.    Follow-up: No Follow-up on file.  Walker Kehr, MD

## 2016-11-09 NOTE — Assessment & Plan Note (Signed)
Percocet Potential benefits of a long term opioids use as well as potential risks (i.e. addiction risk, apnea etc) and complications (i.e. Somnolence, constipation and others) were explained to the patient and were aknowledged. 

## 2016-11-09 NOTE — Assessment & Plan Note (Signed)
Rx May - June - July for Prcocet given

## 2016-11-09 NOTE — Assessment & Plan Note (Signed)
Grieving  

## 2016-11-11 DIAGNOSIS — Z933 Colostomy status: Secondary | ICD-10-CM | POA: Diagnosis not present

## 2016-12-13 DIAGNOSIS — Z933 Colostomy status: Secondary | ICD-10-CM | POA: Diagnosis not present

## 2016-12-19 DIAGNOSIS — Z933 Colostomy status: Secondary | ICD-10-CM | POA: Diagnosis not present

## 2016-12-22 ENCOUNTER — Other Ambulatory Visit: Payer: Self-pay | Admitting: Internal Medicine

## 2017-01-03 DIAGNOSIS — Z436 Encounter for attention to other artificial openings of urinary tract: Secondary | ICD-10-CM | POA: Diagnosis not present

## 2017-01-13 DIAGNOSIS — T83098A Other mechanical complication of other indwelling urethral catheter, initial encounter: Secondary | ICD-10-CM | POA: Diagnosis not present

## 2017-01-13 DIAGNOSIS — N131 Hydronephrosis with ureteral stricture, not elsewhere classified: Secondary | ICD-10-CM | POA: Diagnosis not present

## 2017-01-18 ENCOUNTER — Ambulatory Visit (INDEPENDENT_AMBULATORY_CARE_PROVIDER_SITE_OTHER): Payer: Medicare HMO | Admitting: *Deleted

## 2017-01-18 VITALS — BP 98/52 | HR 67 | Resp 20 | Ht 64.0 in | Wt 93.0 lb

## 2017-01-18 DIAGNOSIS — Z933 Colostomy status: Secondary | ICD-10-CM | POA: Diagnosis not present

## 2017-01-18 DIAGNOSIS — Z0000 Encounter for general adult medical examination without abnormal findings: Secondary | ICD-10-CM

## 2017-01-18 NOTE — Patient Instructions (Signed)
Continue doing brain stimulating activities (puzzles, reading, adult coloring books, staying active) to keep memory sharp.   Continue to eat heart healthy diet (full of fruits, vegetables, whole grains, lean protein, water--limit salt, fat, and sugar intake) and increase physical activity as tolerated.   [name] , Thank you for taking time to come for your Medicare Wellness Visit. I appreciate your ongoing commitment to your health goals. Please review the following plan we discussed and let me know if I can assist you in the future.   These are the goals we discussed: Goals    . Stay as healthy as posssible          Enjoy family and [place]       This is a list of the screening recommended for you and due dates:  Health Maintenance  Topic Date Due  . DEXA scan (bone density measurement)  12/05/1994  . Tetanus Vaccine  12/20/2016  . Flu Shot  03/08/2017  . Pneumonia vaccines  Completed

## 2017-01-18 NOTE — Progress Notes (Signed)
Pre visit review using our clinic review tool, if applicable. No additional management support is needed unless otherwise documented below in the visit note. 

## 2017-01-18 NOTE — Progress Notes (Signed)
Subjective:   [name] is a 83 y.o. female who presents for Medicare Annual (Subsequent) preventive examination.  Review of Systems:  No ROS.  Medicare Wellness Visit. Additional risk factors are reflected in the social history.  Cardiac Risk Factors include: advanced age (>16men, >61 women) Sleep patterns: has frequent nighttime awakenings, feels rested on waking, does not get up to void, gets up 1-2 times nightly to void and sleeps 4-6 hours nightly. Patient reports long-term insomnia issues, discussed recommended sleep tips and stress reduction tips.   Home Safety/Smoke Alarms: Feels safe in home. Smoke alarms in place.  Living environment; residence and Firearm Safety: 2-story house, no firearms. Lives alone, no needs for DME, son lives next door, good family support Seat Belt Safety/Bike Helmet: Wears seat belt.   Counseling:   Eye Exam- appointment yearly Dental- appointment every 6 months   Female:   Pap- N/A     Mammo- N/A      Dexa scan- N/D, patient declined referral        CCS- N/A     Objective:     Vitals: BP (!) 98/52   Pulse 67   Resp 20   Ht 5\' 4"  (1.626 m)   Wt 93 lb (42.2 kg)   SpO2 97%   BMI 15.96 kg/m   Body mass index is 15.96 kg/m.   Tobacco History  Smoking Status  . Current Every Day Smoker  . Packs/day: 0.25  . Types: Cigarettes  Smokeless Tobacco  . Never Used     Ready to quit: Not Answered Counseling given: Not Answered   Past Medical History:  Diagnosis Date  . Anal fistula   . Anemia   . Arthritis   . Colon perforation ([place])   . Colon polyp   . Colostomy in place [facility])   . Depression   . Endometrial cancer ([place])   . Fistula    vesicocolonic  . History of colon cancer   . History of nephrolithiasis   . LBP (low back pain)   . Malnutrition ([place])   . Nephrostomy status ([place])    replaced every 6-8 weeks  . Osteoporosis    Pt declined Rx  . 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 Small bowel obstruction ([place])    . Ureteral stenosis   . Vitamin B12 deficiency   . Vitamin D deficiency    Past Surgical History:  Procedure Laterality Date  . ABCESS DRAINAGE    . ABDOMINAL HYSTERECTOMY    . COLOSTOMY  07/12/2006   for colovesicle fistula - Dr Margot Chimes  . COLOSTOMY TAKEDOWN  07/17/2006   Dr Margot Chimes  . COLOSTOMY TAKEDOWN  01/13/2005   closed iliostomy -Dr Margot Chimes  . CYSTOSCOPY WITH LITHOLAPAXY N/A 04/17/2014   Procedure: CYSTOSCOPY WITH LITHOLAPAXY;  Surgeon: Jorja Loa, MD;  Location: [facility];  Service: Urology;  Laterality: N/A;  . iliostomy  2004   Done due to perf cecum at colonsocpy sone in [place]  . iliostomy revision  09/18/2002   for stenosis - Dr Margot Chimes  . LOW ANTERIOR BOWEL RESECTION  07/09/1988   For sigmoid cancer - Dr Margot Chimes  . NEPHROSTOMY  2008   bilateral- Dr. Jules Schick  . TONSILLECTOMY    . ureteral blockage      04/05/14   . URINARY DIVERSION     Family History  Problem Relation Age of Onset  . Heart disease Father   . Hypertension Other   . Kidney disease Other  History  Sexual Activity  . Sexual activity: Not Currently    Outpatient Encounter Prescriptions as of 01/18/2017  Medication Sig  . Cholecalciferol (VITAMIN D3) 1000 UNITS tablet Take 1,000-2,000 Units by mouth 2 (two) times daily. 1,000 units at lunch and 2,000 units at dinner  . Cyanocobalamin (VITAMIN B-12 PO) Place 3,000 mcg under the tongue daily.  . mirtazapine (REMERON) 15 MG tablet Take 1 tablet (15 mg total) by mouth at bedtime.  Marland Kitchen oxyCODONE-acetaminophen (PERCOCET) 10-325 MG tablet Take 1 tablet by mouth every 6 (six) hours as needed for pain.  . polyethylene glycol powder (GLYCOLAX/MIRALAX) powder Take 255 g by mouth once.  . Probiotic Product (ALIGN) 4 MG CAPS Take 4 mg by mouth daily. For intestinal flora restoration  . temazepam (RESTORIL) 30 MG capsule Take 1 capsule (30 mg total) by mouth at bedtime as needed. for sleep  . [DISCONTINUED] ferrous sulfate 325 (65 FE) MG tablet Take 325  mg by mouth daily with breakfast.  . [DISCONTINUED] ibuprofen (ADVIL,MOTRIN) 600 MG tablet TAKE 1 TABLET (600 MG TOTAL) BY MOUTH EVERY 8 (EIGHT) HOURS AS NEEDED FOR MODERATE PAIN. FOR PAIN (Patient not taking: Reported on 01/18/2017)  . [DISCONTINUED] mirtazapine (REMERON) 15 MG tablet TAKE 1 TABLET BY MOUTH EVERY NIGHT AT BEDTIME   No facility-administered encounter medications on file as of 01/18/2017.     Activities of Daily Living In your present state of health, do you have any difficulty performing the following activities: 01/18/2017  Hearing? Y  Vision? N  Difficulty concentrating or making decisions? N  Walking or climbing stairs? N  Dressing or bathing? N  Doing errands, shopping? N  Preparing Food and eating ? N  Using the Toilet? N  In the past six months, have you accidently leaked urine? N  Do you have problems with loss of bowel control? N  Managing your Medications? N  Managing your Finances? N  Housekeeping or managing your Housekeeping? N  Some recent data might be hidden    Patient Care Team: Plotnikov, Evie Lacks, MD as PCP - Maxine Glenn, MD (General Surgery) Franchot Gallo, MD (Urology) Neldon Mc, MD as Surgeon (General Surgery) Lafayette Dragon, MD (Inactive) as Consulting Physician (Gastroenterology)    Assessment:    Physical assessment deferred to PCP.  Exercise Activities and Dietary recommendations Current Exercise Habits: Home exercise routine, Type of exercise: walking, Time (Minutes): 20, Frequency (Times/Week): 7, Weekly Exercise (Minutes/Week): 140, Intensity: Mild, Exercise limited by: None identified  Diet (meal preparation, eat out, water intake, caffeinated beverages, dairy products, fruits and vegetables): in general, a "healthy" diet  , well balanced, low salt, Patient recently lost weight due to dental issues. She has more upcoming dental work.   Discussed patient eating to gain weight until upcoming dental work.  Encouraged using supplements when going through dental work and for patient to increase water and fluids to avoid dehydration during that period.   Goals    . Stay as healthy as posssible          Enjoy family and [place]      Fall Risk Fall Risk  01/18/2017 01/29/2016 12/31/2014 09/24/2014  Falls in the past year? No No Yes No  Number falls in past yr: - - 1 -  Injury with Fall? - - No -   Depression Screen PHQ 2/9 Scores 01/18/2017 01/29/2016 12/31/2014 09/24/2014  PHQ - 2 Score 2 0 1 0  PHQ- 9 Score 5 - - -  Cognitive Function MMSE - Mini Mental State Exam 01/18/2017  Orientation to time 5  Orientation to Place 5  Registration 3  Attention/ Calculation 4  Recall 2  Language- name 2 objects 2  Language- repeat 1  Language- follow 3 step command 3  Language- read & follow direction 1  Write a sentence 1  Copy design 1  Total score 28        Immunization History  Administered Date(s) Administered  . Influenza Split 05/11/2011, 05/07/2012  . Influenza Whole 05/11/2009, 07/12/2010  . Influenza, High Dose Seasonal PF 06/26/2013, 05/11/2016  . Influenza,inj,Quad PF,36+ Mos 03/28/2014, 07/08/2015  . Pneumococcal Conjugate-13 06/26/2004, 09/27/2013  . Pneumococcal Polysaccharide-23 01/29/2016  . Td 12/21/2006   Screening Tests Health Maintenance  Topic Date Due  . TETANUS/TDAP  12/09/2017 (Originally 12/20/2016)  . DEXA SCAN  01/18/2018 (Originally 12/05/1994)  . INFLUENZA VACCINE  03/08/2017  . PNA vac Low Risk Adult  Completed      Plan:    Continue doing brain stimulating activities (puzzles, reading, adult coloring books, staying active) to keep memory sharp.   Continue to eat heart healthy diet (full of fruits, vegetables, whole grains, lean protein, water--limit salt, fat, and sugar intake) and increase physical activity as tolerated.   I have personally reviewed and noted the following in the patient's chart:   . Medical and social history . Use of  alcohol, tobacco or illicit drugs  . Current medications and supplements . Functional ability and status . Nutritional status . Physical activity . Advanced directives . List of other physicians . Vitals . Screenings to include cognitive, depression, and falls . Referrals and appointments  In addition, I have reviewed and discussed with patient certain preventive protocols, quality metrics, and best practice recommendations. A written personalized care plan for preventive services as well as general preventive health recommendations were provided to patient.     Michiel Cowboy, RN  01/18/2017

## 2017-01-30 ENCOUNTER — Telehealth: Payer: Self-pay | Admitting: Internal Medicine

## 2017-01-30 NOTE — Telephone Encounter (Signed)
Called pt and informed pt to elevate feet to help with the swelling until her appt tomorrow.

## 2017-01-30 NOTE — Telephone Encounter (Signed)
Patient has called back in regard.  I have scheduled her to see Dr. Camila Li tomorrow 6/26.

## 2017-01-30 NOTE — Telephone Encounter (Signed)
Pt called in and said that both of her feet and in steps are swollen.  She has an appt coming up but would like to know what she needs to do about this?  She would like a nurse to call here. Told her she would probably need to be seen but she wanted me to send message anyway   Best number [phone]

## 2017-01-31 ENCOUNTER — Ambulatory Visit (INDEPENDENT_AMBULATORY_CARE_PROVIDER_SITE_OTHER): Payer: Medicare HMO | Admitting: Internal Medicine

## 2017-01-31 ENCOUNTER — Encounter: Payer: Self-pay | Admitting: Internal Medicine

## 2017-01-31 ENCOUNTER — Other Ambulatory Visit (INDEPENDENT_AMBULATORY_CARE_PROVIDER_SITE_OTHER): Payer: Medicare HMO

## 2017-01-31 DIAGNOSIS — M5441 Lumbago with sciatica, right side: Secondary | ICD-10-CM | POA: Diagnosis not present

## 2017-01-31 DIAGNOSIS — R634 Abnormal weight loss: Secondary | ICD-10-CM | POA: Diagnosis not present

## 2017-01-31 DIAGNOSIS — M5442 Lumbago with sciatica, left side: Secondary | ICD-10-CM | POA: Diagnosis not present

## 2017-01-31 DIAGNOSIS — G8929 Other chronic pain: Secondary | ICD-10-CM | POA: Diagnosis not present

## 2017-01-31 DIAGNOSIS — R609 Edema, unspecified: Secondary | ICD-10-CM | POA: Diagnosis not present

## 2017-01-31 DIAGNOSIS — R1084 Generalized abdominal pain: Secondary | ICD-10-CM

## 2017-01-31 LAB — TSH: TSH: 1.66 u[IU]/mL (ref 0.35–4.50)

## 2017-01-31 MED ORDER — FUROSEMIDE 20 MG PO TABS: 20.0000 mg | ORAL_TABLET | Freq: Every day | ORAL | 1 refills | Status: DC | PRN

## 2017-01-31 NOTE — Progress Notes (Signed)
Subjective:  Patient ID: [name], female    DOB: August 02, 1928  Age: 83 y.o. MRN: [id number]  CC: No chief complaint on file.   HPI [name] presents for chronic pain, B12 def, insomnia f/u  Outpatient Medications Prior to Visit  Medication Sig Dispense Refill  . Cholecalciferol (VITAMIN D3) 1000 UNITS tablet Take 1,000-2,000 Units by mouth 2 (two) times daily. 1,000 units at lunch and 2,000 units at dinner    . Cyanocobalamin (VITAMIN B-12 PO) Place 3,000 mcg under the tongue daily.    . mirtazapine (REMERON) 15 MG tablet Take 1 tablet (15 mg total) by mouth at bedtime. 90 tablet 1  . oxyCODONE-acetaminophen (PERCOCET) 10-325 MG tablet Take 1 tablet by mouth every 6 (six) hours as needed for pain. 120 tablet 0  . polyethylene glycol powder (GLYCOLAX/MIRALAX) powder Take 255 g by mouth once. 255 g 5  . Probiotic Product (ALIGN) 4 MG CAPS Take 4 mg by mouth daily. For intestinal flora restoration    . temazepam (RESTORIL) 30 MG capsule Take 1 capsule (30 mg total) by mouth at bedtime as needed. for sleep 90 capsule 1   No facility-administered medications prior to visit.     ROS Review of Systems  Constitutional: Positive for fatigue. Negative for activity change, appetite change, chills and unexpected weight change.  HENT: Negative for congestion, mouth sores and sinus pressure.   Eyes: Negative for visual disturbance.  Respiratory: Negative for cough and chest tightness.   Cardiovascular: Positive for leg swelling.  Gastrointestinal: Negative for abdominal pain and nausea.  Genitourinary: Negative for difficulty urinating, frequency and vaginal pain.  Musculoskeletal: Positive for arthralgias, back pain and gait problem.  Skin: Negative for pallor and rash.  Neurological: Negative for dizziness, tremors, weakness, numbness and headaches.  Psychiatric/Behavioral: Positive for dysphoric mood. Negative for confusion, self-injury, sleep disturbance and suicidal ideas.     Objective:  BP 106/68 (BP Location: Left Arm, Patient Position: Sitting, Cuff Size: Normal)   Pulse 73   Temp 97.7 F (36.5 C) (Oral)   Ht 5\' 4"  (1.626 m)   Wt 95 lb (43.1 kg)   SpO2 99%   BMI 16.31 kg/m   BP Readings from Last 3 Encounters:  01/31/17 106/68  01/18/17 (!) 98/52  11/09/16 110/72    Wt Readings from Last 3 Encounters:  01/31/17 95 lb (43.1 kg)  01/18/17 93 lb (42.2 kg)  11/09/16 98 lb (44.5 kg)    Physical Exam  Constitutional: She appears well-developed. No distress.  HENT:  Head: Normocephalic.  Right Ear: External ear normal.  Left Ear: External ear normal.  Nose: Nose normal.  Mouth/Throat: Oropharynx is clear and moist.  Eyes: Conjunctivae are normal. Pupils are equal, round, and reactive to light. Right eye exhibits no discharge. Left eye exhibits no discharge.  Neck: Normal range of motion. Neck supple. No JVD present. No tracheal deviation present. No thyromegaly present.  Cardiovascular: Normal rate, regular rhythm and normal heart sounds.   Pulmonary/Chest: No stridor. No respiratory distress. She has no wheezes.  Abdominal: Soft. Bowel sounds are normal. She exhibits no distension and no mass. There is no tenderness. There is no rebound and no guarding.  Musculoskeletal: She exhibits no edema or tenderness.  Lymphadenopathy:    She has no cervical adenopathy.  Neurological: She displays normal reflexes. No cranial nerve deficit. She exhibits normal muscle tone. Coordination normal.  Skin: No rash noted. No erythema.  Psychiatric: She has a normal mood and affect. Her  behavior is normal. Judgment and thought content normal.  trace edema B feet dorsally  Lab Results  Component Value Date   WBC 5.2 11/09/2016   HGB 10.7 (L) 11/09/2016   HCT 33.3 (L) 11/09/2016   PLT 219.0 11/09/2016   GLUCOSE 95 11/09/2016   CHOL 127 05/11/2016   TRIG 94.0 05/11/2016   HDL 58.60 05/11/2016   LDLCALC 49 05/11/2016   ALT 8 11/09/2016   AST 11  11/09/2016   NA 137 11/09/2016   K 4.1 11/09/2016   CL 108 11/09/2016   CREATININE 1.37 (H) 11/09/2016   BUN 21 11/09/2016   CO2 25 11/09/2016   TSH 1.76 05/11/2016   INR 0.96 03/05/2014    No results found.  Assessment & Plan:   There are no diagnoses linked to this encounter. I am having Ms. Koopman maintain her ALIGN, cholecalciferol, Cyanocobalamin (VITAMIN B-12 PO), polyethylene glycol powder, oxyCODONE-acetaminophen, temazepam, and mirtazapine.  No orders of the defined types were placed in this encounter.    Follow-up: No Follow-up on file.  Walker Kehr, MD

## 2017-01-31 NOTE — Assessment & Plan Note (Signed)
B feet Furosemide low dose rare Labs Improve nutrition ?CT if needed

## 2017-01-31 NOTE — Assessment & Plan Note (Signed)
Percocet handwritten Rx was given (Pharmacy request)  #120 each x 3 mo

## 2017-01-31 NOTE — Patient Instructions (Signed)
MC well w/Jill 

## 2017-01-31 NOTE — Assessment & Plan Note (Signed)
No change 

## 2017-01-31 NOTE — Assessment & Plan Note (Signed)
Wt Readings from Last 3 Encounters:  01/31/17 95 lb (43.1 kg)  01/18/17 93 lb (42.2 kg)  11/09/16 98 lb (44.5 kg)

## 2017-02-01 LAB — HEPATIC FUNCTION PANEL
ALBUMIN: 3.2 g/dL — AB (ref 3.5–5.2)
ALK PHOS: 96 U/L (ref 39–117)
ALT: 7 U/L (ref 0–35)
AST: 9 U/L (ref 0–37)
BILIRUBIN DIRECT: 0.1 mg/dL (ref 0.0–0.3)
Total Bilirubin: 0.2 mg/dL (ref 0.2–1.2)
Total Protein: 6.4 g/dL (ref 6.0–8.3)

## 2017-02-01 LAB — BASIC METABOLIC PANEL
BUN: 21 mg/dL (ref 6–23)
CALCIUM: 10.2 mg/dL (ref 8.4–10.5)
CO2: 25 mEq/L (ref 19–32)
Chloride: 107 mEq/L (ref 96–112)
Creatinine, Ser: 1.23 mg/dL — ABNORMAL HIGH (ref 0.40–1.20)
GFR: 43.88 mL/min — AB (ref >60.00)
Glucose, Bld: 109 mg/dL — ABNORMAL HIGH (ref 70–99)
POTASSIUM: 4 meq/L (ref 3.5–5.1)
SODIUM: 138 meq/L (ref 135–145)

## 2017-02-10 ENCOUNTER — Ambulatory Visit: Payer: Medicare HMO | Admitting: Internal Medicine

## 2017-02-17 DIAGNOSIS — Z933 Colostomy status: Secondary | ICD-10-CM | POA: Diagnosis not present

## 2017-02-20 DIAGNOSIS — N131 Hydronephrosis with ureteral stricture, not elsewhere classified: Secondary | ICD-10-CM | POA: Diagnosis not present

## 2017-02-20 DIAGNOSIS — Z436 Encounter for attention to other artificial openings of urinary tract: Secondary | ICD-10-CM | POA: Diagnosis not present

## 2017-03-20 DIAGNOSIS — Z933 Colostomy status: Secondary | ICD-10-CM | POA: Diagnosis not present

## 2017-04-05 ENCOUNTER — Other Ambulatory Visit: Payer: Self-pay | Admitting: Internal Medicine

## 2017-04-17 ENCOUNTER — Telehealth: Payer: Self-pay | Admitting: Internal Medicine

## 2017-04-17 NOTE — Telephone Encounter (Signed)
Pt stated that she has been taking meds like she should for her feet swelling and it is not helping.  She said they go down in the morning but they are swollen by night.  She would like to know what she needs to do next   Best number [phone]

## 2017-04-17 NOTE — Telephone Encounter (Signed)
Wear compression knee highs: on in am, off at hs Eat more protein Elevate feet 4 times a day Thx

## 2017-04-18 NOTE — Telephone Encounter (Signed)
Patient called back.  Gave patient Dr. Reola Calkins response.  Patient would like to know if she is still to take lasix?  Patient states she finished the lasix this morning and does not have refills.  Uses [facility] on [address] in [place].  Patient states the lasix help for the first part of the day and then by the evening her feet start swelling again. Patient has been elevating her feet.  States she will start compression stockings.

## 2017-04-18 NOTE — Telephone Encounter (Signed)
No need to take Lasix if it did not help Thx

## 2017-04-19 DIAGNOSIS — Z933 Colostomy status: Secondary | ICD-10-CM | POA: Diagnosis not present

## 2017-04-20 NOTE — Telephone Encounter (Signed)
Pt notified,she states she is going to continue to take it until she is seen in October. She thinks it may be working better than she originally thought

## 2017-04-25 DIAGNOSIS — Z933 Colostomy status: Secondary | ICD-10-CM | POA: Diagnosis not present

## 2017-04-25 DIAGNOSIS — Z436 Encounter for attention to other artificial openings of urinary tract: Secondary | ICD-10-CM | POA: Diagnosis not present

## 2017-04-25 DIAGNOSIS — N131 Hydronephrosis with ureteral stricture, not elsewhere classified: Secondary | ICD-10-CM | POA: Diagnosis not present

## 2017-04-27 DIAGNOSIS — Z436 Encounter for attention to other artificial openings of urinary tract: Secondary | ICD-10-CM | POA: Diagnosis not present

## 2017-04-27 DIAGNOSIS — T83092A Other mechanical complication of nephrostomy catheter, initial encounter: Secondary | ICD-10-CM | POA: Diagnosis not present

## 2017-04-27 DIAGNOSIS — N131 Hydronephrosis with ureteral stricture, not elsewhere classified: Secondary | ICD-10-CM | POA: Diagnosis not present

## 2017-05-02 DIAGNOSIS — H26493 Other secondary cataract, bilateral: Secondary | ICD-10-CM | POA: Diagnosis not present

## 2017-05-02 DIAGNOSIS — H524 Presbyopia: Secondary | ICD-10-CM | POA: Diagnosis not present

## 2017-05-02 DIAGNOSIS — H353131 Nonexudative age-related macular degeneration, bilateral, early dry stage: Secondary | ICD-10-CM | POA: Diagnosis not present

## 2017-05-04 ENCOUNTER — Ambulatory Visit: Payer: Medicare HMO | Admitting: Internal Medicine

## 2017-05-10 ENCOUNTER — Encounter: Payer: Self-pay | Admitting: Internal Medicine

## 2017-05-10 ENCOUNTER — Ambulatory Visit (INDEPENDENT_AMBULATORY_CARE_PROVIDER_SITE_OTHER): Payer: Medicare HMO | Admitting: Internal Medicine

## 2017-05-10 VITALS — BP 92/54 | HR 71 | Temp 97.6°F | Ht 64.0 in | Wt 92.0 lb

## 2017-05-10 DIAGNOSIS — R1084 Generalized abdominal pain: Secondary | ICD-10-CM

## 2017-05-10 DIAGNOSIS — E559 Vitamin D deficiency, unspecified: Secondary | ICD-10-CM

## 2017-05-10 DIAGNOSIS — F4321 Adjustment disorder with depressed mood: Secondary | ICD-10-CM

## 2017-05-10 DIAGNOSIS — H3530 Unspecified macular degeneration: Secondary | ICD-10-CM | POA: Diagnosis not present

## 2017-05-10 DIAGNOSIS — R634 Abnormal weight loss: Secondary | ICD-10-CM | POA: Diagnosis not present

## 2017-05-10 DIAGNOSIS — R69 Illness, unspecified: Secondary | ICD-10-CM | POA: Diagnosis not present

## 2017-05-10 DIAGNOSIS — Z23 Encounter for immunization: Secondary | ICD-10-CM | POA: Diagnosis not present

## 2017-05-10 DIAGNOSIS — E538 Deficiency of other specified B group vitamins: Secondary | ICD-10-CM | POA: Diagnosis not present

## 2017-05-10 DIAGNOSIS — R609 Edema, unspecified: Secondary | ICD-10-CM

## 2017-05-10 MED ORDER — FUROSEMIDE 20 MG PO TABS: 20.0000 mg | ORAL_TABLET | Freq: Every day | ORAL | 1 refills | Status: DC | PRN

## 2017-05-10 MED ORDER — MIRTAZAPINE 15 MG PO TABS: 15.0000 mg | ORAL_TABLET | Freq: Every day | ORAL | 2 refills | Status: DC

## 2017-05-10 NOTE — Assessment & Plan Note (Signed)
On vitamins

## 2017-05-10 NOTE — Assessment & Plan Note (Signed)
Vit D 

## 2017-05-10 NOTE — Assessment & Plan Note (Signed)
Lasix More protein in diet

## 2017-05-10 NOTE — Progress Notes (Signed)
Subjective:  Patient ID: [name], female    DOB: [birth year]  Age: 83 y.o. MRN: [id number]  CC: No chief complaint on file.   HPI [place] Le[place] presents for chronic pain, depression, wt loss, edema f/u  Outpatient Medications Prior to Visit  Medication Sig Dispense Refill  . Cholecalciferol (VITAMIN D3) 1000 UNITS tablet Take 1,000-2,000 Units by mouth 2 (two) times daily. 1,000 units at lunch and 2,000 units at dinner    . Cyanocobalamin (VITAMIN B-12 PO) Place 3,000 mcg under the tongue daily.    . furosemide (LASIX) 20 MG tablet TAKE 1 TABLET (20 MG TOTAL) BY MOUTH DAILY AS NEEDED FOR SWELLING 30 tablet 1  . mirtazapine (REMERON) 15 MG tablet Take 1 tablet (15 mg total) by mouth at bedtime. 90 tablet 1  . oxyCODONE-acetaminophen (PERCOCET) 10-325 MG tablet Take 1 tablet by mouth every 6 (six) hours as needed for pain. 120 tablet 0  . polyethylene glycol powder (GLYCOLAX/MIRALAX) powder Take 255 g by mouth once. 255 g 5  . Probiotic Product (ALIGN) 4 MG CAPS Take 4 mg by mouth daily. For intestinal flora restoration    . temazepam (RESTORIL) 30 MG capsule Take 1 capsule (30 mg total) by mouth at bedtime as needed. for sleep 90 capsule 1   No facility-administered medications prior to visit.     ROS Review of Systems  Constitutional: Positive for fatigue and unexpected weight change. Negative for activity change, appetite change and chills.  HENT: Negative for congestion, mouth sores and sinus pressure.   Eyes: Negative for visual disturbance.  Respiratory: Negative for cough and chest tightness.   Cardiovascular: Positive for leg swelling.  Gastrointestinal: Positive for abdominal pain. Negative for nausea.  Genitourinary: Negative for difficulty urinating, frequency and vaginal pain.  Musculoskeletal: Positive for arthralgias and back pain. Negative for gait problem.  Skin: Negative for pallor and rash.  Neurological: Negative for dizziness, tremors, weakness, numbness  and headaches.  Psychiatric/Behavioral: Negative for confusion, sleep disturbance and suicidal ideas. The patient is nervous/anxious.     Objective:  BP (!) 92/54 (BP Location: Left Arm, Patient Position: Sitting, Cuff Size: Normal)   Pulse 71   Temp 97.6 F (36.4 C) (Oral)   Ht 5\' 4"  (1.626 m)   Wt 92 lb (41.7 kg)   SpO2 98%   BMI 15.79 kg/m   BP Readings from Last 3 Encounters:  05/10/17 (!) 92/54  01/31/17 106/68  01/18/17 (!) 98/52    Wt Readings from Last 3 Encounters:  05/10/17 92 lb (41.7 kg)  01/31/17 95 lb (43.1 kg)  01/18/17 93 lb (42.2 kg)    Physical Exam  Constitutional: She appears well-developed. No distress.  HENT:  Head: Normocephalic.  Right Ear: External ear normal.  Left Ear: External ear normal.  Nose: Nose normal.  Mouth/Throat: Oropharynx is clear and moist.  Eyes: Pupils are equal, round, and reactive to light. Conjunctivae are normal. Right eye exhibits no discharge. Left eye exhibits no discharge.  Neck: Normal range of motion. Neck supple. No JVD present. No tracheal deviation present. No thyromegaly present.  Cardiovascular: Normal rate, regular rhythm and normal heart sounds.   Pulmonary/Chest: No stridor. No respiratory distress. She has no wheezes.  Abdominal: Soft. Bowel sounds are normal. She exhibits no distension and no mass. There is no tenderness. There is no rebound and no guarding.  Musculoskeletal: She exhibits tenderness. She exhibits no edema.  Lymphadenopathy:    She has no cervical adenopathy.  Neurological: She  displays normal reflexes. No cranial nerve deficit. She exhibits normal muscle tone. Coordination normal.  Skin: No rash noted. No erythema.  Psychiatric: She has a normal mood and affect. Her behavior is normal. Judgment and thought content normal.  thin Abdomen and LS spine - tender catheter  Lab Results  Component Value Date   WBC 5.2 11/09/2016   HGB 10.7 (L) 11/09/2016   HCT 33.3 (L) 11/09/2016   PLT 219.0  11/09/2016   GLUCOSE 109 (H) 01/31/2017   CHOL 127 05/11/2016   TRIG 94.0 05/11/2016   HDL 58.60 05/11/2016   LDLCALC 49 05/11/2016   ALT 7 01/31/2017   AST 9 01/31/2017   NA 138 01/31/2017   K 4.0 01/31/2017   CL 107 01/31/2017   CREATININE 1.23 (H) 01/31/2017   BUN 21 01/31/2017   CO2 25 01/31/2017   TSH 1.66 01/31/2017   INR 0.96 03/05/2014    No results found.  Assessment & Plan:   There are no diagnoses linked to this encounter. I am having Ms. Lemieux maintain her ALIGN, cholecalciferol, Cyanocobalamin (VITAMIN B-12 PO), polyethylene glycol powder, oxyCODONE-acetaminophen, temazepam, mirtazapine, and furosemide.  No orders of the defined types were placed in this encounter.    Follow-up: No Follow-up on file.  Walker Kehr, MD

## 2017-05-10 NOTE — Assessment & Plan Note (Signed)
The pt is dealing with her loss better

## 2017-05-10 NOTE — Assessment & Plan Note (Signed)
Percocet  Chronic due to renal stents, adhesions  Potential benefits of a long term opioids use as well as potential risks (i.e. addiction risk, apnea etc) and complications (i.e. Somnolence, constipation and others) were explained to the patient and were aknowledged.

## 2017-05-10 NOTE — Assessment & Plan Note (Signed)
Increase protein in diet

## 2017-05-10 NOTE — Assessment & Plan Note (Signed)
On B12 

## 2017-05-15 ENCOUNTER — Other Ambulatory Visit: Payer: Self-pay

## 2017-05-15 MED ORDER — FUROSEMIDE 20 MG PO TABS: 20.0000 mg | ORAL_TABLET | Freq: Every day | ORAL | 0 refills | Status: DC | PRN

## 2017-05-15 NOTE — Progress Notes (Signed)
Pt ins prefers 90d/refaxed Furosemide for 90d/thx dmf

## 2017-05-18 NOTE — Addendum Note (Signed)
Addended by: Karren Cobble on: 05/18/2017 01:51 PM   Modules accepted: Orders

## 2017-05-24 DIAGNOSIS — N3945 Continuous leakage: Secondary | ICD-10-CM | POA: Diagnosis not present

## 2017-05-24 DIAGNOSIS — N824 Other female intestinal-genital tract fistulae: Secondary | ICD-10-CM | POA: Diagnosis not present

## 2017-05-25 DIAGNOSIS — Z933 Colostomy status: Secondary | ICD-10-CM | POA: Diagnosis not present

## 2017-06-23 DIAGNOSIS — Z933 Colostomy status: Secondary | ICD-10-CM | POA: Diagnosis not present

## 2017-06-25 ENCOUNTER — Other Ambulatory Visit: Payer: Self-pay | Admitting: Internal Medicine

## 2017-06-27 DIAGNOSIS — Z436 Encounter for attention to other artificial openings of urinary tract: Secondary | ICD-10-CM | POA: Diagnosis not present

## 2017-06-27 DIAGNOSIS — N131 Hydronephrosis with ureteral stricture, not elsewhere classified: Secondary | ICD-10-CM | POA: Diagnosis not present

## 2017-07-24 DIAGNOSIS — Z933 Colostomy status: Secondary | ICD-10-CM | POA: Diagnosis not present

## 2017-08-11 ENCOUNTER — Encounter: Payer: Self-pay | Admitting: Internal Medicine

## 2017-08-11 ENCOUNTER — Ambulatory Visit: Payer: Medicare HMO | Admitting: Internal Medicine

## 2017-08-11 DIAGNOSIS — R609 Edema, unspecified: Secondary | ICD-10-CM

## 2017-08-11 DIAGNOSIS — R69 Illness, unspecified: Secondary | ICD-10-CM | POA: Diagnosis not present

## 2017-08-11 DIAGNOSIS — E538 Deficiency of other specified B group vitamins: Secondary | ICD-10-CM | POA: Diagnosis not present

## 2017-08-11 DIAGNOSIS — R1084 Generalized abdominal pain: Secondary | ICD-10-CM

## 2017-08-11 DIAGNOSIS — F331 Major depressive disorder, recurrent, moderate: Secondary | ICD-10-CM | POA: Diagnosis not present

## 2017-08-11 MED ORDER — MIRTAZAPINE 15 MG PO TABS: 15.0000 mg | ORAL_TABLET | Freq: Every day | ORAL | 3 refills | Status: DC

## 2017-08-11 NOTE — Assessment & Plan Note (Signed)
Percocet  Chronic due to renal stents, adhesions  Potential benefits of a long term opioids use as well as potential risks (i.e. addiction risk, apnea etc) and complications (i.e. Somnolence, constipation and others) were explained to the patient and were aknowledged.

## 2017-08-11 NOTE — Assessment & Plan Note (Signed)
On B12 

## 2017-08-11 NOTE — Assessment & Plan Note (Signed)
Remeron  

## 2017-08-11 NOTE — Patient Instructions (Signed)
MC well w/Jill 

## 2017-08-11 NOTE — Assessment & Plan Note (Signed)
Lasix More protein in diet

## 2017-08-11 NOTE — Progress Notes (Signed)
Subjective:  Patient ID: [name], female    DOB: 1927-12-05  Age: 84 y.o. MRN: [id number]  CC: No chief complaint on file.   HPI [name] presents for chronic pain, grief, wt loss f/u C/o L dist shin wound  Outpatient Medications Prior to Visit  Medication Sig Dispense Refill  . Cholecalciferol (VITAMIN D3) 1000 UNITS tablet Take 1,000-2,000 Units by mouth 2 (two) times daily. 1,000 units at lunch and 2,000 units at dinner    . Cyanocobalamin (VITAMIN B-12 PO) Place 3,000 mcg under the tongue daily.    . furosemide (LASIX) 20 MG tablet Take 1 tablet (20 mg total) by mouth daily as needed. For swelling 90 tablet 0  . mirtazapine (REMERON) 15 MG tablet Take 1 tablet (15 mg total) by mouth at bedtime. 90 tablet 2  . oxyCODONE-acetaminophen (PERCOCET) 10-325 MG tablet Take 1 tablet by mouth every 6 (six) hours as needed for pain. 120 tablet 0  . polyethylene glycol powder (GLYCOLAX/MIRALAX) powder Take 255 g by mouth once. 255 g 5  . Probiotic Product (ALIGN) 4 MG CAPS Take 4 mg by mouth daily. For intestinal flora restoration    . temazepam (RESTORIL) 30 MG capsule TAKE 1 CAPSULE BY MOUTH AT BEDTIME AS NEEDED FOR SLEEP 90 capsule 1   No facility-administered medications prior to visit.     ROS Review of Systems  Constitutional: Negative for activity change, appetite change, chills, fatigue and unexpected weight change.  HENT: Negative for congestion, mouth sores and sinus pressure.   Eyes: Negative for visual disturbance.  Respiratory: Negative for cough and chest tightness.   Cardiovascular: Positive for leg swelling.  Gastrointestinal: Positive for abdominal pain. Negative for nausea.  Genitourinary: Negative for difficulty urinating, frequency and vaginal pain.  Musculoskeletal: Positive for arthralgias and back pain. Negative for gait problem.  Skin: Negative for pallor and rash.  Neurological: Negative for dizziness, tremors, weakness, numbness and headaches.    Psychiatric/Behavioral: Negative for confusion and sleep disturbance.    Objective:  BP (!) 96/54 (BP Location: Left Arm, Patient Position: Sitting, Cuff Size: Normal)   Pulse 77   Temp 97.8 F (36.6 C) (Oral)   Ht 5\' 4"  (1.626 m)   Wt 94 lb (42.6 kg)   SpO2 98%   BMI 16.14 kg/m   BP Readings from Last 3 Encounters:  08/11/17 (!) 96/54  05/10/17 (!) 92/54  01/31/17 106/68    Wt Readings from Last 3 Encounters:  08/11/17 94 lb (42.6 kg)  05/10/17 92 lb (41.7 kg)  01/31/17 95 lb (43.1 kg)    Physical Exam  Constitutional: She appears well-developed. No distress.  HENT:  Head: Normocephalic.  Right Ear: External ear normal.  Left Ear: External ear normal.  Nose: Nose normal.  Mouth/Throat: Oropharynx is clear and moist.  Eyes: Conjunctivae are normal. Pupils are equal, round, and reactive to light. Right eye exhibits no discharge. Left eye exhibits no discharge.  Neck: Normal range of motion. Neck supple. No JVD present. No tracheal deviation present. No thyromegaly present.  Cardiovascular: Normal rate, regular rhythm and normal heart sounds.  Pulmonary/Chest: No stridor. No respiratory distress. She has no wheezes.  Abdominal: Soft. Bowel sounds are normal. She exhibits no distension and no mass. There is no tenderness. There is no rebound and no guarding.  Musculoskeletal: She exhibits tenderness. She exhibits no edema.  Lymphadenopathy:    She has no cervical adenopathy.  Neurological: She displays normal reflexes. No cranial nerve deficit. She exhibits  normal muscle tone. Coordination normal.  Skin: No rash noted. No erythema.  Psychiatric: She has a normal mood and affect. Her behavior is normal. Judgment and thought content normal.  urostomy bag LS/abd is tender Thin Scab on R shin is healing  Lab Results  Component Value Date   WBC 5.2 11/09/2016   HGB 10.7 (L) 11/09/2016   HCT 33.3 (L) 11/09/2016   PLT 219.0 11/09/2016   GLUCOSE 109 (H) 01/31/2017    CHOL 127 05/11/2016   TRIG 94.0 05/11/2016   HDL 58.60 05/11/2016   LDLCALC 49 05/11/2016   ALT 7 01/31/2017   AST 9 01/31/2017   NA 138 01/31/2017   K 4.0 01/31/2017   CL 107 01/31/2017   CREATININE 1.23 (H) 01/31/2017   BUN 21 01/31/2017   CO2 25 01/31/2017   TSH 1.66 01/31/2017   INR 0.96 03/05/2014    No results found.  Assessment & Plan:   There are no diagnoses linked to this encounter. I am having [name] maintain her ALIGN, cholecalciferol, Cyanocobalamin (VITAMIN B-12 PO), polyethylene glycol powder, oxyCODONE-acetaminophen, mirtazapine, furosemide, and temazepam.  No orders of the defined types were placed in this encounter.    Follow-up: No Follow-up on file.  Walker Kehr, MD

## 2017-08-14 DIAGNOSIS — C541 Malignant neoplasm of endometrium: Secondary | ICD-10-CM | POA: Diagnosis not present

## 2017-08-14 DIAGNOSIS — Z466 Encounter for fitting and adjustment of urinary device: Secondary | ICD-10-CM | POA: Diagnosis not present

## 2017-08-14 DIAGNOSIS — Z436 Encounter for attention to other artificial openings of urinary tract: Secondary | ICD-10-CM | POA: Diagnosis not present

## 2017-08-14 DIAGNOSIS — N131 Hydronephrosis with ureteral stricture, not elsewhere classified: Secondary | ICD-10-CM | POA: Diagnosis not present

## 2017-08-23 DIAGNOSIS — Z933 Colostomy status: Secondary | ICD-10-CM | POA: Diagnosis not present

## 2017-09-03 ENCOUNTER — Other Ambulatory Visit: Payer: Self-pay | Admitting: Internal Medicine

## 2017-09-22 DIAGNOSIS — Z933 Colostomy status: Secondary | ICD-10-CM | POA: Diagnosis not present

## 2017-10-09 ENCOUNTER — Telehealth: Payer: Self-pay | Admitting: Internal Medicine

## 2017-10-09 DIAGNOSIS — N131 Hydronephrosis with ureteral stricture, not elsewhere classified: Secondary | ICD-10-CM | POA: Diagnosis not present

## 2017-10-09 DIAGNOSIS — Z436 Encounter for attention to other artificial openings of urinary tract: Secondary | ICD-10-CM | POA: Diagnosis not present

## 2017-10-09 NOTE — Telephone Encounter (Signed)
Copied from [place] [id number]. Topic: Quick Communication - See Telephone Encounter >> Oct 09, 2017 10:49 AM Bea Graff, NT wrote: CRM for notification. See Telephone encounter for: [name] from [facility] in [place] calling and states that the  oxyCODONE-acetaminophen was not wrote on the correct prescription paper and this is unable to be filled. She states this can be either wrote on the right prescription paper or if can be sent over electronically. Please call pt to let her know which way the doctor will be sending rx over. CB#: [phone]  10/09/17.

## 2017-10-09 NOTE — Telephone Encounter (Signed)
Notified pharmacist w/MD response. informher not sure who gave her the rx , but it wasn't Dr. Alain Marion. Last script MD gave her was back in 2016.pharmacist state pt actually took rx back it will not be filled...[name]

## 2017-10-09 NOTE — Telephone Encounter (Signed)
I don't have any other pads/paper for Rx Thx

## 2017-10-09 NOTE — Telephone Encounter (Signed)
Rec'd call from [facility] stating she has pt on the phone concerning the msg below. Pt states Dr. Alain Marion gave her the script when she saw him back in January. Inform Shana per this chart last oxycodone rx was given back in 2016. Pull up the [place] registry it shows refills date for Feb, Jan and dec all refilled by Dr. Leary Roca below).    09/11/2017 1 05/10/2017 Oxycodone-Acetaminophen 10-325 120 30 Al Plo [id number] Nor ([id number]) 0 60.00 MME Medicare [place]  08/09/2017 1 05/10/2017 Oxycodone-Acetaminophen 10-325 120 30 Al Plo [id number] Nor ([id number]) 0 60.00 MME Medicare [place]  07/10/2017 1 05/10/2017 Oxycodone-Acetaminophen 10-325 120 30 Al Plo [id number] Nor (0439) 0 60.00 MME Medicare    I'm not sure what happen, but pt is going to bring the rx that she have back tomorrow. In the meantime will have to see if pt have duplicate chart or something...Johny Chess

## 2017-10-10 ENCOUNTER — Other Ambulatory Visit: Payer: Medicare HMO

## 2017-10-10 ENCOUNTER — Telehealth: Payer: Self-pay

## 2017-10-10 DIAGNOSIS — Z79899 Other long term (current) drug therapy: Secondary | ICD-10-CM

## 2017-10-10 MED ORDER — OXYCODONE-ACETAMINOPHEN 10-325 MG PO TABS: 1.0000 | ORAL_TABLET | Freq: Four times a day (QID) | ORAL | 0 refills | Status: DC | PRN

## 2017-10-10 NOTE — Telephone Encounter (Signed)
Pt walk-in and brought wriiten rx's that was given to her on 08/11/17 for the Percocet 10/325 mg. MD gave pt rx's written on the old rx pad. (see phone note from yesterday). Also pt has not had a UDS.... Re-printing rx's on correct prescription paper due to pt waiting in lobby.Marland KitchenJohny Chess

## 2017-10-10 NOTE — Telephone Encounter (Signed)
Reprinted scripts for March and April. Place on MD counter top to sign. Also pt went down stairs to have UDS.Marland KitchenJohny Chess

## 2017-10-10 NOTE — Telephone Encounter (Signed)
Thx

## 2017-10-15 LAB — PAIN MGMT, PROFILE 8 W/CONF, U
6 Acetylmorphine: NEGATIVE ng/mL (ref <10)
ALPHAHYDROXYALPRAZOLAM: NEGATIVE ng/mL (ref <25)
ALPHAHYDROXYMIDAZOLAM: NEGATIVE ng/mL (ref <50)
ALPHAHYDROXYTRIAZOLAM: NEGATIVE ng/mL (ref <50)
AMPHETAMINES: NEGATIVE ng/mL (ref <500)
Alcohol Metabolites: NEGATIVE ng/mL (ref <500)
Aminoclonazepam: NEGATIVE ng/mL (ref <25)
BENZODIAZEPINES: POSITIVE ng/mL — AB (ref <100)
BUPRENORPHINE, URINE: NEGATIVE ng/mL (ref <5)
CREATININE: 123.4 mg/dL
Cocaine Metabolite: NEGATIVE ng/mL (ref <150)
Codeine: NEGATIVE ng/mL (ref <50)
HYDROCODONE: NEGATIVE ng/mL (ref <50)
Hydromorphone: NEGATIVE ng/mL (ref <50)
Hydroxyethylflurazepam: NEGATIVE ng/mL (ref <50)
Lorazepam: NEGATIVE ng/mL (ref <50)
MARIJUANA METABOLITE: NEGATIVE ng/mL (ref <20)
MDMA: NEGATIVE ng/mL (ref <500)
MORPHINE: NEGATIVE ng/mL (ref <50)
NORDIAZEPAM: 258 ng/mL — AB (ref <50)
NORHYDROCODONE: NEGATIVE ng/mL (ref <50)
Noroxycodone: 8577 ng/mL — ABNORMAL HIGH (ref <50)
OPIATES: NEGATIVE ng/mL (ref <100)
OXIDANT: NEGATIVE ug/mL (ref <200)
OXYMORPHONE: 6031 ng/mL — AB (ref <50)
Oxycodone: 2008 ng/mL — ABNORMAL HIGH (ref <50)
Oxycodone: POSITIVE ng/mL — AB (ref <100)
Temazepam: >1000 ng/mL — ABNORMAL HIGH (ref <50)
pH: 7.31 (ref 4.5–9.0)

## 2017-10-23 DIAGNOSIS — Z933 Colostomy status: Secondary | ICD-10-CM | POA: Diagnosis not present

## 2017-10-31 DIAGNOSIS — H353131 Nonexudative age-related macular degeneration, bilateral, early dry stage: Secondary | ICD-10-CM | POA: Diagnosis not present

## 2017-10-31 DIAGNOSIS — H26493 Other secondary cataract, bilateral: Secondary | ICD-10-CM | POA: Diagnosis not present

## 2017-11-10 ENCOUNTER — Ambulatory Visit: Payer: Medicare HMO | Admitting: Internal Medicine

## 2017-11-15 ENCOUNTER — Encounter: Payer: Self-pay | Admitting: Internal Medicine

## 2017-11-15 ENCOUNTER — Ambulatory Visit: Payer: Medicare HMO | Admitting: Internal Medicine

## 2017-11-15 DIAGNOSIS — M6740 Ganglion, unspecified site: Secondary | ICD-10-CM | POA: Diagnosis not present

## 2017-11-15 DIAGNOSIS — R634 Abnormal weight loss: Secondary | ICD-10-CM

## 2017-11-15 DIAGNOSIS — E559 Vitamin D deficiency, unspecified: Secondary | ICD-10-CM

## 2017-11-15 DIAGNOSIS — M810 Age-related osteoporosis without current pathological fracture: Secondary | ICD-10-CM | POA: Diagnosis not present

## 2017-11-15 DIAGNOSIS — E538 Deficiency of other specified B group vitamins: Secondary | ICD-10-CM

## 2017-11-15 DIAGNOSIS — R1084 Generalized abdominal pain: Secondary | ICD-10-CM | POA: Diagnosis not present

## 2017-11-15 MED ORDER — OXYCODONE-ACETAMINOPHEN 10-325 MG PO TABS: 1.0000 | ORAL_TABLET | Freq: Four times a day (QID) | ORAL | 0 refills | Status: DC | PRN

## 2017-11-15 MED ORDER — MIRTAZAPINE 15 MG PO TABS: 15.0000 mg | ORAL_TABLET | Freq: Every day | ORAL | 3 refills | Status: DC

## 2017-11-15 MED ORDER — METHYLPREDNISOLONE ACETATE 40 MG/ML IJ SUSP
10.0000 mg | Freq: Once | INTRAMUSCULAR | Status: AC
  Administered 2017-11-15: 10 mg via INTRALESIONAL

## 2017-11-15 NOTE — Assessment & Plan Note (Signed)
Percocet  Chronic due to renal stents, adhesions  Potential benefits of a long term opioids use as well as potential risks (i.e. addiction risk, apnea etc) and complications (i.e. Somnolence, constipation and others) were explained to the patient and were aknowledged.

## 2017-11-15 NOTE — Assessment & Plan Note (Signed)
Vit D 

## 2017-11-15 NOTE — Assessment & Plan Note (Signed)
Wt Readings from Last 3 Encounters:  11/15/17 94 lb (42.6 kg)  08/11/17 94 lb (42.6 kg)  05/10/17 92 lb (41.7 kg)

## 2017-11-15 NOTE — Patient Instructions (Addendum)
Ganglion Cyst A ganglion cyst is a noncancerous, fluid-filled lump that occurs near joints or tendons. The ganglion cyst grows out of a joint or the lining of a tendon. It most often develops in the hand or wrist, but it can also develop in the shoulder, elbow, hip, knee, ankle, or foot. The round or oval ganglion cyst can be the size of a pea or larger than a grape. Increased activity may enlarge the size of the cyst because more fluid starts to build up. What are the causes? It is not known what causes a ganglion cyst to grow. However, it may be related to:  Inflammation or irritation around the joint.  An injury.  Repetitive movements or overuse.  Arthritis.  What increases the risk? Risk factors include:  Being a woman.  Being age 54-50.  What are the signs or symptoms? Symptoms may include:  A lump. This most often appears on the hand or wrist, but it can occur in other areas of the body.  Tingling.  Pain.  Numbness.  Muscle weakness.  Weak grip.  Less movement in a joint.  How is this diagnosed? Ganglion cysts are most often diagnosed based on a physical exam. Your health care provider will feel the lump and may shine a light alongside it. If it is a ganglion cyst, a light often shines through it. Your health care provider may order an X-ray, ultrasound, or MRI to rule out other conditions. How is this treated? Ganglion cysts usually go away on their own without treatment. If pain or other symptoms are involved, treatment may be needed. Treatment is also needed if the ganglion cyst limits your movement or if it gets infected. Treatment may include:  Wearing a brace or splint on your wrist or finger.  Taking anti-inflammatory medicine.  Draining fluid from the lump with a needle (aspiration).  Injecting a steroid into the joint.  Surgery to remove the ganglion cyst.  Follow these instructions at home:  Do not press on the ganglion cyst, poke it with a  needle, or hit it.  Take medicines only as directed by your health care provider.  Wear your brace or splint as directed by your health care provider.  Watch your ganglion cyst for any changes.  Keep all follow-up visits as directed by your health care provider. This is important. Contact a health care provider if:  Your ganglion cyst becomes larger or more painful.  You have increased redness, red streaks, or swelling.  You have pus coming from the lump.  You have weakness or numbness in the affected area.  You have a fever or chills. This information is not intended to replace advice given to you by your health care provider. Make sure you discuss any questions you have with your health care provider. Document Released: 07/22/2000 Document Revised: 12/31/2015 Document Reviewed: 01/07/2014 Elsevier Interactive Patient Education  2018 [place] it easy for a day or two. Use ice 20 min four times a day x 24 hrs. Call if problems.

## 2017-11-15 NOTE — Assessment & Plan Note (Signed)
Options discussed 

## 2017-11-15 NOTE — Assessment & Plan Note (Signed)
On B12 

## 2017-11-15 NOTE — Addendum Note (Signed)
Addended by: Karren Cobble on: 11/15/2017 01:14 PM   Modules accepted: Orders

## 2017-11-15 NOTE — Progress Notes (Signed)
Subjective:  Patient ID: [name], female    DOB: 03/21/1928  Age: 84 y.o. MRN: [id number]  CC: No chief complaint on file.   HPI [place] presents for chronic pain, depression, wt loss C/o lump on a wrist C/o R index finger numbness this am  Outpatient Medications Prior to Visit  Medication Sig Dispense Refill  . Cholecalciferol (VITAMIN D3) 1000 UNITS tablet Take 1,000-2,000 Units by mouth 2 (two) times daily. 1,000 units at lunch and 2,000 units at dinner    . Cyanocobalamin (VITAMIN B-12 PO) Place 3,000 mcg under the tongue daily.    . furosemide (LASIX) 20 MG tablet TAKE 1 TABLET (20 MG TOTAL) BY MOUTH DAILY AS NEEDED. FOR SWELLING 90 tablet 0  . mirtazapine (REMERON) 15 MG tablet Take 1 tablet (15 mg total) by mouth at bedtime. 90 tablet 3  . oxyCODONE-acetaminophen (PERCOCET) 10-325 MG tablet Take 1 tablet by mouth every 6 (six) hours as needed for pain. 120 tablet 0  . polyethylene glycol powder (GLYCOLAX/MIRALAX) powder Take 255 g by mouth once. 255 g 5  . Probiotic Product (ALIGN) 4 MG CAPS Take 4 mg by mouth daily. For intestinal flora restoration    . temazepam (RESTORIL) 30 MG capsule TAKE 1 CAPSULE BY MOUTH AT BEDTIME AS NEEDED FOR SLEEP 90 capsule 1   No facility-administered medications prior to visit.     ROS Review of Systems  Constitutional: Positive for fatigue. Negative for activity change, appetite change, chills and unexpected weight change.  HENT: Negative for congestion, mouth sores and sinus pressure.   Eyes: Negative for visual disturbance.  Respiratory: Negative for cough and chest tightness.   Gastrointestinal: Negative for abdominal pain and nausea.  Genitourinary: Negative for difficulty urinating, frequency and vaginal pain.  Musculoskeletal: Positive for arthralgias and back pain. Negative for gait problem.  Skin: Negative for pallor and rash.  Neurological: Negative for dizziness, tremors, weakness, numbness and headaches.    Psychiatric/Behavioral: Positive for dysphoric mood. Negative for confusion and sleep disturbance. The patient is nervous/anxious.     Objective:  BP (!) 96/54 (BP Location: Left Arm, Patient Position: Sitting, Cuff Size: Normal)   Pulse 80   Temp 97.8 F (36.6 C) (Oral)   Ht 5\' 4"  (1.626 m)   Wt 94 lb (42.6 kg)   SpO2 98%   BMI 16.14 kg/m   BP Readings from Last 3 Encounters:  11/15/17 (!) 96/54  08/11/17 (!) 96/54  05/10/17 (!) 92/54    Wt Readings from Last 3 Encounters:  11/15/17 94 lb (42.6 kg)  08/11/17 94 lb (42.6 kg)  05/10/17 92 lb (41.7 kg)    Physical Exam  Constitutional: She appears well-developed. No distress.  HENT:  Head: Normocephalic.  Right Ear: External ear normal.  Left Ear: External ear normal.  Nose: Nose normal.  Mouth/Throat: Oropharynx is clear and moist.  Eyes: Pupils are equal, round, and reactive to light. Conjunctivae are normal. Right eye exhibits no discharge. Left eye exhibits no discharge.  Neck: Normal range of motion. Neck supple. No JVD present. No tracheal deviation present. No thyromegaly present.  Cardiovascular: Normal rate, regular rhythm and normal heart sounds.  Pulmonary/Chest: No stridor. No respiratory distress. She has no wheezes.  Abdominal: Soft. Bowel sounds are normal. She exhibits no distension and no mass. There is no tenderness. There is no rebound and no guarding.  Musculoskeletal: She exhibits tenderness. She exhibits no edema.  LS tender  Lymphadenopathy:    She has no  cervical adenopathy.  Neurological: She displays normal reflexes. No cranial nerve deficit. She exhibits normal muscle tone. Coordination normal.  Skin: No rash noted. No erythema.  Psychiatric: Her behavior is normal. Judgment and thought content normal.  Sad  R  Wrist ganglion 1 cm - palmar aspect    Procedure Note :    Procedure :   Sonography examination   Indication: swelling /mass on    Equipment used: Sonosite M-Turbo with  HFL38x/13-6 MHz transducer linear probe. The images were stored in the unit and later transferred in storage.  The patient was placed in a decubitus position.   This study revealed a subcutaneous heteroechotic lesion on L lat foot c/w ganglion. Needle entry point was marked.   Impression:  ganglion cyst  Procedure:  Cyst aspiration  Indication:  Growing cyst aspiration and steroid injection  Risks including bleeding, infection, relapse and others as well as benefits were explained to the patient in detail. He was placed in the decubitus position. Skin was prepped with Betadine and alcohol and injected with 1 cc of 2% lidocaine. 20 gauge needle was introduced in the cyst and 1-1.5 cc of clear gel-like material was aspirated and discarded. The cavity was injected with 10 mg of Depo-medrol and 0.5 cc of 2 % Lidocaine. Bandaid. ACE wrap.  Tolerated well. Complications none. Instructions provided.  Instructions:  Take it easy for a day or two. Use ice 20 min four times a day x 24 hrs. Call if problems.     Lab Results  Component Value Date   WBC 5.2 11/09/2016   HGB 10.7 (L) 11/09/2016   HCT 33.3 (L) 11/09/2016   PLT 219.0 11/09/2016   GLUCOSE 109 (H) 01/31/2017   CHOL 127 05/11/2016   TRIG 94.0 05/11/2016   HDL 58.60 05/11/2016   LDLCALC 49 05/11/2016   ALT 7 01/31/2017   AST 9 01/31/2017   NA 138 01/31/2017   K 4.0 01/31/2017   CL 107 01/31/2017   CREATININE 1.23 (H) 01/31/2017   BUN 21 01/31/2017   CO2 25 01/31/2017   TSH 1.66 01/31/2017   INR 0.96 03/05/2014    No results found.  Assessment & Plan:   There are no diagnoses linked to this encounter. I am having [place] B. [place] maintain her ALIGN, cholecalciferol, Cyanocobalamin (VITAMIN B-12 PO), polyethylene glycol powder, temazepam, mirtazapine, furosemide, and oxyCODONE-acetaminophen.  No orders of the defined types were placed in this encounter.    Follow-up: No follow-ups on file.  Walker Kehr,  MD

## 2017-11-22 DIAGNOSIS — Z933 Colostomy status: Secondary | ICD-10-CM | POA: Diagnosis not present

## 2017-12-02 ENCOUNTER — Other Ambulatory Visit: Payer: Self-pay | Admitting: Internal Medicine

## 2017-12-11 DIAGNOSIS — Z436 Encounter for attention to other artificial openings of urinary tract: Secondary | ICD-10-CM | POA: Diagnosis not present

## 2017-12-11 DIAGNOSIS — Z466 Encounter for fitting and adjustment of urinary device: Secondary | ICD-10-CM | POA: Diagnosis not present

## 2017-12-11 DIAGNOSIS — N131 Hydronephrosis with ureteral stricture, not elsewhere classified: Secondary | ICD-10-CM | POA: Diagnosis not present

## 2017-12-25 ENCOUNTER — Other Ambulatory Visit: Payer: Self-pay | Admitting: Internal Medicine

## 2017-12-25 DIAGNOSIS — Z933 Colostomy status: Secondary | ICD-10-CM | POA: Diagnosis not present

## 2018-01-08 ENCOUNTER — Telehealth: Payer: Self-pay | Admitting: Internal Medicine

## 2018-01-08 NOTE — Telephone Encounter (Signed)
MD out of the office will hold until he return tomorrow for his response.Marland KitchenJohny Chess

## 2018-01-08 NOTE — Telephone Encounter (Signed)
Copied from [place] [id number]. Topic: Quick Communication - Rx Refill/Question >> Jan 08, 2018 11:53 AM Lennox Solders wrote: Medication:oxycodone Has the patient contacted their pharmacy? yes (Agent: If no, request that the patient contact the pharmacy for the refill.) pt has an appointment next month: If yes, when and what did the pharmacy advise?)  Preferred Pharmacy (with phone number or street name): [facility] [place] on [address]: Please be advised that RX refills may take up to 3 business days. We ask that you follow-up with your pharmacy.

## 2018-01-10 NOTE — Telephone Encounter (Signed)
What is the question? Is it a ref for Percocet? It was ref through July I think Thx

## 2018-01-12 NOTE — Telephone Encounter (Signed)
Error

## 2018-01-24 DIAGNOSIS — Z933 Colostomy status: Secondary | ICD-10-CM | POA: Diagnosis not present

## 2018-01-29 DIAGNOSIS — Z933 Colostomy status: Secondary | ICD-10-CM | POA: Diagnosis not present

## 2018-02-12 DIAGNOSIS — Z436 Encounter for attention to other artificial openings of urinary tract: Secondary | ICD-10-CM | POA: Diagnosis not present

## 2018-02-12 DIAGNOSIS — N820 Vesicovaginal fistula: Secondary | ICD-10-CM | POA: Diagnosis not present

## 2018-02-12 DIAGNOSIS — N131 Hydronephrosis with ureteral stricture, not elsewhere classified: Secondary | ICD-10-CM | POA: Diagnosis not present

## 2018-02-14 ENCOUNTER — Encounter: Payer: Self-pay | Admitting: Internal Medicine

## 2018-02-14 ENCOUNTER — Ambulatory Visit: Payer: Medicare HMO | Admitting: Internal Medicine

## 2018-02-14 VITALS — BP 96/54 | HR 92 | Temp 98.2°F | Ht 64.0 in | Wt 92.0 lb

## 2018-02-14 DIAGNOSIS — G8929 Other chronic pain: Secondary | ICD-10-CM | POA: Diagnosis not present

## 2018-02-14 DIAGNOSIS — G4700 Insomnia, unspecified: Secondary | ICD-10-CM | POA: Insufficient documentation

## 2018-02-14 DIAGNOSIS — Z23 Encounter for immunization: Secondary | ICD-10-CM

## 2018-02-14 DIAGNOSIS — N183 Chronic kidney disease, stage 3 (moderate): Secondary | ICD-10-CM

## 2018-02-14 DIAGNOSIS — M5441 Lumbago with sciatica, right side: Secondary | ICD-10-CM | POA: Diagnosis not present

## 2018-02-14 DIAGNOSIS — F331 Major depressive disorder, recurrent, moderate: Secondary | ICD-10-CM

## 2018-02-14 DIAGNOSIS — R1084 Generalized abdominal pain: Secondary | ICD-10-CM

## 2018-02-14 DIAGNOSIS — K56609 Unspecified intestinal obstruction, unspecified as to partial versus complete obstruction: Secondary | ICD-10-CM

## 2018-02-14 DIAGNOSIS — M5442 Lumbago with sciatica, left side: Secondary | ICD-10-CM

## 2018-02-14 DIAGNOSIS — R69 Illness, unspecified: Secondary | ICD-10-CM | POA: Diagnosis not present

## 2018-02-14 DIAGNOSIS — I959 Hypotension, unspecified: Secondary | ICD-10-CM

## 2018-02-14 DIAGNOSIS — E538 Deficiency of other specified B group vitamins: Secondary | ICD-10-CM

## 2018-02-14 DIAGNOSIS — N179 Acute kidney failure, unspecified: Secondary | ICD-10-CM

## 2018-02-14 MED ORDER — OXYCODONE-ACETAMINOPHEN 10-325 MG PO TABS: 1.0000 | ORAL_TABLET | Freq: Four times a day (QID) | ORAL | 0 refills | Status: DC | PRN

## 2018-02-14 MED ORDER — TEMAZEPAM 30 MG PO CAPS: ORAL_CAPSULE | ORAL | 1 refills | Status: DC

## 2018-02-14 MED ORDER — MIRTAZAPINE 15 MG PO TABS: 15.0000 mg | ORAL_TABLET | Freq: Every day | ORAL | 3 refills | Status: AC

## 2018-02-14 NOTE — Addendum Note (Signed)
Addended by: Karren Cobble on: 02/14/2018 09:46 AM   Modules accepted: Orders

## 2018-02-14 NOTE — Assessment & Plan Note (Signed)
Remeron  

## 2018-02-14 NOTE — Patient Instructions (Signed)
MC well w/jill

## 2018-02-14 NOTE — Progress Notes (Signed)
Subjective:  Patient ID: [name], female    DOB: 05/12/1928  Age: 84 y.o. MRN: [id number]  CC: No chief complaint on file.   HPI [place] B [place] presents for chronic pain, anxiety f/u C/o abd pain, constpation, nausea spells. C/o fatigue  Outpatient Medications Prior to Visit  Medication Sig Dispense Refill  . Cholecalciferol (VITAMIN D3) 1000 UNITS tablet Take 1,000-2,000 Units by mouth 2 (two) times daily. 1,000 units at lunch and 2,000 units at dinner    . Cyanocobalamin (VITAMIN B-12 PO) Place 3,000 mcg under the tongue daily.    . furosemide (LASIX) 20 MG tablet TAKE 1 TABLET (20 MG TOTAL) BY MOUTH DAILY AS NEEDED. FOR SWELLING 90 tablet 0  . LUTEIN PO Take by mouth.    . mirtazapine (REMERON) 15 MG tablet Take 1 tablet (15 mg total) by mouth at bedtime. 90 tablet 3  . oxyCODONE-acetaminophen (PERCOCET) 10-325 MG tablet Take 1 tablet by mouth every 6 (six) hours as needed for pain. 120 tablet 0  . polyethylene glycol powder (GLYCOLAX/MIRALAX) powder Take 255 g by mouth once. 255 g 5  . Probiotic Product (ALIGN) 4 MG CAPS Take 4 mg by mouth daily. For intestinal flora restoration    . temazepam (RESTORIL) 30 MG capsule TAKE 1 CAPSULE BY MOUTH AT BEDTIME AS NEEDED FOR SLEEP 90 capsule 1   No facility-administered medications prior to visit.     ROS: Review of Systems  Constitutional: Positive for fatigue. Negative for activity change, appetite change, chills and unexpected weight change.  HENT: Negative for congestion, mouth sores and sinus pressure.   Eyes: Negative for visual disturbance.  Respiratory: Negative for cough and chest tightness.   Gastrointestinal: Positive for abdominal pain, constipation and nausea.  Genitourinary: Negative for difficulty urinating, frequency and vaginal pain.  Musculoskeletal: Positive for back pain. Negative for gait problem.  Skin: Negative for pallor and rash.  Neurological: Negative for dizziness, tremors, weakness, numbness and  headaches.  Psychiatric/Behavioral: Positive for decreased concentration, dysphoric mood and sleep disturbance. Negative for confusion and suicidal ideas. The patient is nervous/anxious.     Objective:  BP (!) 96/54 (BP Location: Left Arm, Patient Position: Sitting, Cuff Size: Normal)   Pulse 92   Temp 98.2 F (36.8 C) (Oral)   Ht 5\' 4"  (1.626 m)   Wt 92 lb (41.7 kg)   SpO2 97%   BMI 15.79 kg/m   BP Readings from Last 3 Encounters:  02/14/18 (!) 96/54  11/15/17 (!) 96/54  08/11/17 (!) 96/54    Wt Readings from Last 3 Encounters:  02/14/18 92 lb (41.7 kg)  11/15/17 94 lb (42.6 kg)  08/11/17 94 lb (42.6 kg)    Physical Exam  Constitutional: She appears well-developed. No distress.  HENT:  Head: Normocephalic.  Right Ear: External ear normal.  Left Ear: External ear normal.  Nose: Nose normal.  Mouth/Throat: Oropharynx is clear and moist.  Eyes: Pupils are equal, round, and reactive to light. Conjunctivae are normal. Right eye exhibits no discharge. Left eye exhibits no discharge.  Neck: Normal range of motion. Neck supple. No JVD present. No tracheal deviation present. No thyromegaly present.  Cardiovascular: Normal rate, regular rhythm and normal heart sounds.  Pulmonary/Chest: No stridor. No respiratory distress. She has no wheezes.  Abdominal: Soft. Bowel sounds are normal. She exhibits no distension and no mass. There is tenderness. There is no rebound and no guarding.  Musculoskeletal: She exhibits tenderness. She exhibits no edema.  Lymphadenopathy:  She has no cervical adenopathy.  Neurological: She displays normal reflexes. No cranial nerve deficit. She exhibits normal muscle tone. Coordination normal.  Skin: No rash noted. No erythema.  Psychiatric: Her behavior is normal. Judgment and thought content normal.  sad NAD colost back urost bag  Lab Results  Component Value Date   WBC 5.2 11/09/2016   HGB 10.7 (L) 11/09/2016   HCT 33.3 (L) 11/09/2016   PLT  219.0 11/09/2016   GLUCOSE 109 (H) 01/31/2017   CHOL 127 05/11/2016   TRIG 94.0 05/11/2016   HDL 58.60 05/11/2016   LDLCALC 49 05/11/2016   ALT 7 01/31/2017   AST 9 01/31/2017   NA 138 01/31/2017   K 4.0 01/31/2017   CL 107 01/31/2017   CREATININE 1.23 (H) 01/31/2017   BUN 21 01/31/2017   CO2 25 01/31/2017   TSH 1.66 01/31/2017   INR 0.96 03/05/2014    No results found.  Assessment & Plan:   There are no diagnoses linked to this encounter.   No orders of the defined types were placed in this encounter.    Follow-up: No follow-ups on file.  Walker Kehr, MD

## 2018-02-14 NOTE — Assessment & Plan Note (Signed)
On B12 

## 2018-02-14 NOTE — Assessment & Plan Note (Signed)
Chronic  Temazepam prn  Potential benefits of a long term benzodiazepines  use as well as potential risks  and complications were explained to the patient and were aknowledged.

## 2018-02-14 NOTE — Assessment & Plan Note (Signed)
Percocet  Chronic due to renal stents, adhesions  Potential benefits of a long term opioids use as well as potential risks (i.e. addiction risk, apnea etc) and complications (i.e. Somnolence, constipation and others) were explained to the patient and were aknowledged. Miralax po

## 2018-02-14 NOTE — Assessment & Plan Note (Signed)
Chronic  Potential benefits of a long term opioids use as well as potential risks (i.e. addiction risk, apnea etc) and complications (i.e. Somnolence, constipation and others) were explained to the patient and were aknowledged.  Percocet

## 2018-02-15 ENCOUNTER — Inpatient Hospital Stay (HOSPITAL_COMMUNITY)
Admission: EM | Admit: 2018-02-15 | Discharge: 2018-03-01 | DRG: 388 | Disposition: A | Discharge status: Skilled Nursing Facility | Payer: Medicare HMO | Attending: Family Medicine | Admitting: Family Medicine

## 2018-02-15 ENCOUNTER — Emergency Department (HOSPITAL_COMMUNITY): Payer: Medicare HMO

## 2018-02-15 ENCOUNTER — Other Ambulatory Visit: Payer: Self-pay

## 2018-02-15 ENCOUNTER — Encounter (HOSPITAL_COMMUNITY): Payer: Self-pay | Admitting: Emergency Medicine

## 2018-02-15 DIAGNOSIS — E876 Hypokalemia: Secondary | ICD-10-CM | POA: Diagnosis present

## 2018-02-15 DIAGNOSIS — G8929 Other chronic pain: Secondary | ICD-10-CM | POA: Diagnosis present

## 2018-02-15 DIAGNOSIS — R1084 Generalized abdominal pain: Secondary | ICD-10-CM | POA: Diagnosis present

## 2018-02-15 DIAGNOSIS — K56609 Unspecified intestinal obstruction, unspecified as to partial versus complete obstruction: Secondary | ICD-10-CM

## 2018-02-15 DIAGNOSIS — G4700 Insomnia, unspecified: Secondary | ICD-10-CM | POA: Diagnosis present

## 2018-02-15 DIAGNOSIS — N898 Other specified noninflammatory disorders of vagina: Secondary | ICD-10-CM | POA: Diagnosis present

## 2018-02-15 DIAGNOSIS — Z936 Other artificial openings of urinary tract status: Secondary | ICD-10-CM

## 2018-02-15 DIAGNOSIS — K565 Intestinal adhesions [bands], unspecified as to partial versus complete obstruction: Principal | ICD-10-CM | POA: Diagnosis present

## 2018-02-15 DIAGNOSIS — I361 Nonrheumatic tricuspid (valve) insufficiency: Secondary | ICD-10-CM | POA: Diagnosis not present

## 2018-02-15 DIAGNOSIS — Z90710 Acquired absence of both cervix and uterus: Secondary | ICD-10-CM | POA: Diagnosis not present

## 2018-02-15 DIAGNOSIS — N179 Acute kidney failure, unspecified: Secondary | ICD-10-CM

## 2018-02-15 DIAGNOSIS — B9620 Unspecified Escherichia coli [E. coli] as the cause of diseases classified elsewhere: Secondary | ICD-10-CM | POA: Diagnosis present

## 2018-02-15 DIAGNOSIS — J90 Pleural effusion, not elsewhere classified: Secondary | ICD-10-CM | POA: Diagnosis not present

## 2018-02-15 DIAGNOSIS — R110 Nausea: Secondary | ICD-10-CM | POA: Diagnosis present

## 2018-02-15 DIAGNOSIS — N183 Chronic kidney disease, stage 3 unspecified: Secondary | ICD-10-CM | POA: Diagnosis present

## 2018-02-15 DIAGNOSIS — E8809 Other disorders of plasma-protein metabolism, not elsewhere classified: Secondary | ICD-10-CM | POA: Diagnosis not present

## 2018-02-15 DIAGNOSIS — R140 Abdominal distension (gaseous): Secondary | ICD-10-CM | POA: Diagnosis not present

## 2018-02-15 DIAGNOSIS — I959 Hypotension, unspecified: Secondary | ICD-10-CM | POA: Diagnosis present

## 2018-02-15 DIAGNOSIS — Z681 Body mass index (BMI) 19 or less, adult: Secondary | ICD-10-CM

## 2018-02-15 DIAGNOSIS — E875 Hyperkalemia: Secondary | ICD-10-CM | POA: Diagnosis present

## 2018-02-15 DIAGNOSIS — M545 Low back pain: Secondary | ICD-10-CM | POA: Diagnosis present

## 2018-02-15 DIAGNOSIS — Z9889 Other specified postprocedural states: Secondary | ICD-10-CM

## 2018-02-15 DIAGNOSIS — I495 Sick sinus syndrome: Secondary | ICD-10-CM | POA: Diagnosis not present

## 2018-02-15 DIAGNOSIS — Z66 Do not resuscitate: Secondary | ICD-10-CM | POA: Diagnosis present

## 2018-02-15 DIAGNOSIS — Z933 Colostomy status: Secondary | ICD-10-CM | POA: Diagnosis not present

## 2018-02-15 DIAGNOSIS — R0602 Shortness of breath: Secondary | ICD-10-CM | POA: Diagnosis not present

## 2018-02-15 DIAGNOSIS — F17210 Nicotine dependence, cigarettes, uncomplicated: Secondary | ICD-10-CM | POA: Diagnosis present

## 2018-02-15 DIAGNOSIS — K8050 Calculus of bile duct without cholangitis or cholecystitis without obstruction: Secondary | ICD-10-CM

## 2018-02-15 DIAGNOSIS — K56699 Other intestinal obstruction unspecified as to partial versus complete obstruction: Secondary | ICD-10-CM | POA: Diagnosis not present

## 2018-02-15 DIAGNOSIS — Z8542 Personal history of malignant neoplasm of other parts of uterus: Secondary | ICD-10-CM

## 2018-02-15 DIAGNOSIS — E43 Unspecified severe protein-calorie malnutrition: Secondary | ICD-10-CM | POA: Diagnosis present

## 2018-02-15 DIAGNOSIS — D509 Iron deficiency anemia, unspecified: Secondary | ICD-10-CM | POA: Diagnosis not present

## 2018-02-15 DIAGNOSIS — C549 Malignant neoplasm of corpus uteri, unspecified: Secondary | ICD-10-CM | POA: Diagnosis present

## 2018-02-15 DIAGNOSIS — E870 Hyperosmolality and hypernatremia: Secondary | ICD-10-CM | POA: Diagnosis present

## 2018-02-15 DIAGNOSIS — D638 Anemia in other chronic diseases classified elsewhere: Secondary | ICD-10-CM | POA: Diagnosis present

## 2018-02-15 DIAGNOSIS — Z515 Encounter for palliative care: Secondary | ICD-10-CM | POA: Diagnosis not present

## 2018-02-15 DIAGNOSIS — Z923 Personal history of irradiation: Secondary | ICD-10-CM

## 2018-02-15 DIAGNOSIS — K8070 Calculus of gallbladder and bile duct without cholecystitis without obstruction: Secondary | ICD-10-CM | POA: Diagnosis present

## 2018-02-15 DIAGNOSIS — R112 Nausea with vomiting, unspecified: Secondary | ICD-10-CM | POA: Diagnosis not present

## 2018-02-15 DIAGNOSIS — R188 Other ascites: Secondary | ICD-10-CM

## 2018-02-15 DIAGNOSIS — Z789 Other specified health status: Secondary | ICD-10-CM

## 2018-02-15 DIAGNOSIS — R935 Abnormal findings on diagnostic imaging of other abdominal regions, including retroperitoneum: Secondary | ICD-10-CM

## 2018-02-15 DIAGNOSIS — C187 Malignant neoplasm of sigmoid colon: Secondary | ICD-10-CM | POA: Diagnosis not present

## 2018-02-15 DIAGNOSIS — N823 Fistula of vagina to large intestine: Secondary | ICD-10-CM | POA: Diagnosis present

## 2018-02-15 DIAGNOSIS — R0600 Dyspnea, unspecified: Secondary | ICD-10-CM

## 2018-02-15 DIAGNOSIS — Z85038 Personal history of other malignant neoplasm of large intestine: Secondary | ICD-10-CM

## 2018-02-15 DIAGNOSIS — M810 Age-related osteoporosis without current pathological fracture: Secondary | ICD-10-CM | POA: Diagnosis present

## 2018-02-15 DIAGNOSIS — R109 Unspecified abdominal pain: Secondary | ICD-10-CM | POA: Diagnosis not present

## 2018-02-15 DIAGNOSIS — R001 Bradycardia, unspecified: Secondary | ICD-10-CM | POA: Diagnosis not present

## 2018-02-15 DIAGNOSIS — F329 Major depressive disorder, single episode, unspecified: Secondary | ICD-10-CM | POA: Diagnosis present

## 2018-02-15 DIAGNOSIS — K603 Anal fistula: Secondary | ICD-10-CM | POA: Diagnosis not present

## 2018-02-15 DIAGNOSIS — N184 Chronic kidney disease, stage 4 (severe): Secondary | ICD-10-CM | POA: Diagnosis present

## 2018-02-15 DIAGNOSIS — Z7189 Other specified counseling: Secondary | ICD-10-CM | POA: Diagnosis not present

## 2018-02-15 DIAGNOSIS — K8020 Calculus of gallbladder without cholecystitis without obstruction: Secondary | ICD-10-CM

## 2018-02-15 HISTORY — DX: Malignant neoplasm of sigmoid colon: C18.7

## 2018-02-15 HISTORY — DX: Calculus of gallbladder without cholecystitis without obstruction: K80.20

## 2018-02-15 HISTORY — DX: Calculus of bile duct without cholangitis or cholecystitis without obstruction: K80.50

## 2018-02-15 LAB — COMPREHENSIVE METABOLIC PANEL
ALK PHOS: 119 U/L (ref 38–126)
ALT: 13 U/L (ref 0–44)
AST: 13 U/L — AB (ref 15–41)
Albumin: 2.6 g/dL — ABNORMAL LOW (ref 3.5–5.0)
Anion gap: 11 (ref 5–15)
BUN: 40 mg/dL — ABNORMAL HIGH (ref 8–23)
CALCIUM: 9.4 mg/dL (ref 8.9–10.3)
CHLORIDE: 108 mmol/L (ref 98–111)
CO2: 21 mmol/L — AB (ref 22–32)
CREATININE: 1.92 mg/dL — AB (ref 0.44–1.00)
GFR calc Af Amer: 26 mL/min — ABNORMAL LOW (ref >60)
GFR calc non Af Amer: 22 mL/min — ABNORMAL LOW (ref >60)
GLUCOSE: 111 mg/dL — AB (ref 70–99)
Potassium: 3.4 mmol/L — ABNORMAL LOW (ref 3.5–5.1)
SODIUM: 140 mmol/L (ref 135–145)
Total Bilirubin: 0.6 mg/dL (ref 0.3–1.2)
Total Protein: 6.3 g/dL — ABNORMAL LOW (ref 6.5–8.1)

## 2018-02-15 LAB — I-STAT CG4 LACTIC ACID, ED
Lactic Acid, Venous: 1.1 mmol/L (ref 0.5–1.9)
Lactic Acid, Venous: 1.16 mmol/L (ref 0.5–1.9)

## 2018-02-15 LAB — CBC WITH DIFFERENTIAL/PLATELET
BASOS PCT: 0 %
Basophils Absolute: 0 10*3/uL (ref 0.0–0.1)
EOS ABS: 0 10*3/uL (ref 0.0–0.7)
Eosinophils Relative: 0 %
HCT: 27.5 % — ABNORMAL LOW (ref 36.0–46.0)
HEMOGLOBIN: 7.9 g/dL — AB (ref 12.0–15.0)
Lymphocytes Relative: 11 %
Lymphs Abs: 0.8 10*3/uL (ref 0.7–4.0)
MCH: 20.9 pg — AB (ref 26.0–34.0)
MCHC: 28.7 g/dL — ABNORMAL LOW (ref 30.0–36.0)
MCV: 72.8 fL — ABNORMAL LOW (ref 78.0–100.0)
Monocytes Absolute: 0.4 10*3/uL (ref 0.1–1.0)
Monocytes Relative: 5 %
NEUTROS PCT: 84 %
Neutro Abs: 6 10*3/uL (ref 1.7–7.7)
PLATELETS: 371 10*3/uL (ref 150–400)
RBC: 3.78 MIL/uL — AB (ref 3.87–5.11)
RDW: 18.3 % — ABNORMAL HIGH (ref 11.5–15.5)
WBC: 7.2 10*3/uL (ref 4.0–10.5)

## 2018-02-15 LAB — LIPASE, BLOOD: Lipase: 20 U/L (ref 11–51)

## 2018-02-15 MED ORDER — MORPHINE SULFATE (PF) 4 MG/ML IV SOLN
4.0000 mg | Freq: Once | INTRAVENOUS | Status: AC
  Administered 2018-02-15: 4 mg via INTRAVENOUS
  Filled 2018-02-15: qty 1

## 2018-02-15 MED ORDER — SODIUM CHLORIDE 0.9 % IV BOLUS
1000.0000 mL | Freq: Once | INTRAVENOUS | Status: AC
  Administered 2018-02-15: 1000 mL via INTRAVENOUS

## 2018-02-15 MED ORDER — PIPERACILLIN-TAZOBACTAM 3.375 G IVPB 30 MIN
3.3750 g | Freq: Once | INTRAVENOUS | Status: AC
  Administered 2018-02-15: 3.375 g via INTRAVENOUS
  Filled 2018-02-15: qty 50

## 2018-02-15 MED ORDER — ONDANSETRON HCL 4 MG/2ML IJ SOLN
4.0000 mg | Freq: Once | INTRAMUSCULAR | Status: AC
  Administered 2018-02-15: 4 mg via INTRAVENOUS
  Filled 2018-02-15: qty 2

## 2018-02-15 MED ORDER — MORPHINE SULFATE (PF) 4 MG/ML IV SOLN
4.0000 mg | Freq: Once | INTRAVENOUS | Status: AC
Start: 2018-02-15 — End: 2018-02-15
  Administered 2018-02-15: 4 mg via INTRAVENOUS
  Filled 2018-02-15: qty 1

## 2018-02-15 NOTE — ED Triage Notes (Signed)
Pt reports since yesterday she had colostomy blockage. Reports had it last week and was able to get unblocked herself.

## 2018-02-15 NOTE — ED Notes (Signed)
ED Provider at bedside. 

## 2018-02-15 NOTE — ED Notes (Signed)
Patient transported to CT 

## 2018-02-15 NOTE — ED Notes (Signed)
Pt returned from CT. NG reconnected to intermittent suction. Pt reports that she feels better after NG reconnected. About 75cc of output.

## 2018-02-15 NOTE — ED Provider Notes (Signed)
[facility] DEPT Provider Note   CSN: [id number] Arrival date & time: 02/15/18  1730     History   Chief Complaint Chief Complaint  Patient presents with  . colostomy blockage    HPI [place] is a 84 y.o. female.  HPI 84 year old female with extensive past medical history as below including multiple intra-abdominal surgeries and colon perforation here with abdominal distention.  Patient states that over the last 2 days, she has had little to no ostomy output.  She said associated progressively worsening abdominal distention and pain.  Pain is aching, cramp-like, severe.  She has associated nausea and vomiting.  She has not been able to eat or drink.  The pain seems to come and go and intermittently worsen when her abdomen becomes very rigid.  She continues to make urine through her nephrostomy tubes.  No fevers.  No chills.  Past Medical History:  Diagnosis Date  . Anal fistula   . Anemia   . Arthritis   . Colon perforation ([place])   . Colon polyp   . Colostomy in place [facility])   . Depression   . Endometrial cancer ([place])   . Fistula    vesicocolonic  . History of colon cancer   . History of nephrolithiasis   . LBP (low back pain)   . Malnutrition ([place])   . Nephrostomy status ([place])    replaced every 6-8 weeks  . Osteoporosis    Pt declined Rx  . 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 Small bowel obstruction ([place])   . Ureteral stenosis   . Vitamin B12 deficiency   . Vitamin D deficiency     Patient Active Problem List   Diagnosis Date Noted  . Insomnia 02/14/2018  . Ganglion cyst 11/15/2017  . Macular degeneration (senile) of retina 05/10/2017  . Edema 01/31/2017  . Grieving 05/11/2016  . Well adult exam 01/29/2016  . Cramps, extremity 05/07/2012  . UTI (lower urinary tract infection) 03/07/2012  . Elevated LFTs 12/30/2011  . Colostomy dysfunction ([place]) 04/15/2011  . Abdominal pain, generalized 10/05/2010  . PARESTHESIA  09/15/2010  . WEIGHT LOSS 05/07/2010  . FLATULENCE 03/05/2010  . TOBACCO USE, QUIT 07/08/2009  . UNSPECIFIED PYELONEPHRITIS 12/07/2007  . Vitamin D deficiency 08/03/2007  . NEPHROLITHIASIS, HX OF 08/03/2007  . ANEMIA-NOS 04/27/2007  . LOW BACK PAIN 04/27/2007  . CANCER, ENDOMETRIUM 02/22/2007  . POLYP, COLON 02/22/2007  . B12 deficiency 02/22/2007  . Major depressive disorder, recurrent episode, moderate ([place]) 02/22/2007  . FISTULA, ANAL 02/22/2007  . OBSTRUCTION, URETERIC NEC 02/22/2007  . HOT FLASHES 02/22/2007  . Osteoporosis 02/22/2007  . COLON CANCER, HX OF 02/22/2007    Past Surgical History:  Procedure Laterality Date  . ABCESS DRAINAGE    . ABDOMINAL HYSTERECTOMY    . COLOSTOMY  07/12/2006   for colovesicle fistula - Dr Margot Chimes  . COLOSTOMY TAKEDOWN  07/17/2006   Dr Margot Chimes  . COLOSTOMY TAKEDOWN  01/13/2005   closed iliostomy -Dr Margot Chimes  . CYSTOSCOPY WITH LITHOLAPAXY N/A 04/17/2014   Procedure: CYSTOSCOPY WITH LITHOLAPAXY;  Surgeon: Jorja Loa, MD;  Location: [facility] ORS;  Service: Urology;  Laterality: N/A;  . iliostomy  2004   Done due to perf cecum at colonsocpy sone in [place]  . iliostomy revision  09/18/2002   for stenosis - Dr Margot Chimes  . LOW ANTERIOR BOWEL RESECTION  07/09/1988   For sigmoid cancer - Dr Margot Chimes  . NEPHROSTOMY  2008   bilateral-  Dr. Jules Schick  . TONSILLECTOMY    . ureteral blockage      04/05/14   . URINARY DIVERSION       OB History   None      Home Medications    Prior to Admission medications   Medication Sig Start Date End Date Taking? Authorizing Provider  Cholecalciferol (VITAMIN D3) 1000 UNITS tablet Take 1,000-2,000 Units by mouth 2 (two) times daily. 1,000 units at lunch and 2,000 units at dinner   Yes [provider]  Cyanocobalamin (VITAMIN B-12 PO) Place 3,000 mcg under the tongue daily.   Yes [provider]  furosemide (LASIX) 20 MG tablet TAKE 1 TABLET (20 MG TOTAL) BY MOUTH DAILY AS NEEDED. FOR  SWELLING 12/04/17  Yes Plotnikov, Evie Lacks, MD  LUTEIN PO Take by mouth.   Yes [provider]  mirtazapine (REMERON) 15 MG tablet Take 1 tablet (15 mg total) by mouth at bedtime. 02/14/18  Yes Plotnikov, Evie Lacks, MD  oxyCODONE-acetaminophen (PERCOCET) 10-325 MG tablet Take 1 tablet by mouth every 6 (six) hours as needed for pain. 02/14/18  Yes Plotnikov, Evie Lacks, MD  polyethylene glycol powder (GLYCOLAX/MIRALAX) powder Take 255 g by mouth once. 04/01/15  Yes Plotnikov, Evie Lacks, MD  Probiotic Product (ALIGN) 4 MG CAPS Take 4 mg by mouth daily. For intestinal flora restoration   Yes [provider]  temazepam (RESTORIL) 30 MG capsule TAKE 1 CAPSULE BY MOUTH AT BEDTIME AS NEEDED FOR SLEEP 02/14/18  Yes Plotnikov, Evie Lacks, MD    Family History Family History  Problem Relation Age of Onset  . Heart disease Father   . Hypertension Other   . Kidney disease Other     Social History Social History   Tobacco Use  . Smoking status: Current Every Day Smoker    Packs/day: 0.25    Types: Cigarettes  . Smokeless tobacco: Never Used  Substance Use Topics  . Alcohol use: Yes    Comment: 1-2 drinks per week   . Drug use: No     Allergies   Ciprofloxacin; Flagyl [metronidazole hcl]; Gabapentin; Raloxifene; and Venlafaxine   Review of Systems Review of Systems  Constitutional: Positive for fatigue. Negative for chills and fever.  HENT: Negative for congestion, rhinorrhea and sore throat.   Eyes: Negative for visual disturbance.  Respiratory: Negative for cough, shortness of breath and wheezing.   Cardiovascular: Negative for chest pain and leg swelling.  Gastrointestinal: Positive for abdominal distention, abdominal pain, nausea and vomiting. Negative for diarrhea.  Genitourinary: Negative for dysuria, flank pain, vaginal bleeding and vaginal discharge.  Musculoskeletal: Negative for neck pain.  Skin: Negative for rash.  Allergic/Immunologic: Negative for  immunocompromised state.  Neurological: Positive for weakness. Negative for syncope and headaches.  Hematological: Does not bruise/bleed easily.  All other systems reviewed and are negative.    Physical Exam Updated Vital Signs BP (!) 97/56   Pulse 76   Temp 98.1 F (36.7 C) (Oral)   Resp 18   Ht 5\' 5"  (1.651 m)   Wt 41.7 kg (92 lb)   SpO2 99%   BMI 15.31 kg/m   Physical Exam  Constitutional: She is oriented to person, place, and time. She appears well-developed and well-nourished. She appears distressed.  HENT:  Head: Normocephalic and atraumatic.  Eyes: Conjunctivae are normal.  Neck: Neck supple.  Cardiovascular: Normal rate, regular rhythm and normal heart sounds.  Pulmonary/Chest: Effort normal. No respiratory distress. She has no wheezes.  Abdominal: Soft. Normal appearance.  She exhibits distension. There is generalized tenderness and tenderness in the periumbilical area.  Left-sided ostomy in place, minimal amount of liquid stool in vault  Musculoskeletal: She exhibits no edema.  Neurological: She is alert and oriented to person, place, and time. She exhibits normal muscle tone.  Skin: Skin is warm. Capillary refill takes less than 2 seconds. No rash noted.  Nursing note and vitals reviewed.    ED Treatments / Results  Labs (all labs ordered are listed, but only abnormal results are displayed) Labs Reviewed  CBC WITH DIFFERENTIAL/PLATELET - Abnormal; Notable for the following components:      Result Value   RBC 3.78 (*)    Hemoglobin 7.9 (*)    HCT 27.5 (*)    MCV 72.8 (*)    MCH 20.9 (*)    MCHC 28.7 (*)    RDW 18.3 (*)    All other components within normal limits  COMPREHENSIVE METABOLIC PANEL - Abnormal; Notable for the following components:   Potassium 3.4 (*)    CO2 21 (*)    Glucose, Bld 111 (*)    BUN 40 (*)    Creatinine, Ser 1.92 (*)    Total Protein 6.3 (*)    Albumin 2.6 (*)    AST 13 (*)    GFR calc non Af Amer 22 (*)    GFR calc Af Amer  26 (*)    All other components within normal limits  CULTURE, BLOOD (ROUTINE X 2)  CULTURE, BLOOD (ROUTINE X 2)  LIPASE, BLOOD  I-STAT CG4 LACTIC ACID, ED  I-STAT CG4 LACTIC ACID, ED  TYPE AND SCREEN    EKG None  Radiology Ct Abdomen Pelvis Wo Contrast  Result Date: 02/15/2018 CLINICAL DATA:  84 year old female with history of colon cancer and colostomy presenting with obstruction. EXAM: CT ABDOMEN AND PELVIS WITHOUT CONTRAST TECHNIQUE: Multidetector CT imaging of the abdomen and pelvis was performed following the standard protocol without IV contrast. COMPARISON:  Abdominal radiograph dated 02/15/2018. FINDINGS: Evaluation of this exam is limited in the absence of intravenous contrast. Lower chest: The visualized lung bases are clear. There is hypoattenuation of the cardiac blood pool suggestive of a degree of anemia. Clinical correlation is recommended. No intra-abdominal free air or free fluid. Hepatobiliary: The liver is unremarkable on this noncontrast CT. No intrahepatic biliary ductal dilatation. There are multiple stones within the gallbladder with the largest stone measuring approximately 3 cm in length. Ultrasound may provide better evaluation of the gallbladder if clinically indicated. No pericholecystic fluid. Multiple large stones noted common bile duct. These stones measure up to 11 mm in diameter. Pancreas: Unremarkable. No pancreatic ductal dilatation or surrounding inflammatory changes. Spleen: Normal in size without focal abnormality. Adrenals/Urinary Tract: The adrenal glands are unremarkable. Bilateral percutaneous nephrostomy tubes noted. The tip of the left nephrostomy tube is in the interpolar collecting system of the left kidney. There is mild fullness of the left renal collecting system and left ureter. There is no hydronephrosis on the right. Mechanical or Amplatzer device noted in the proximal left ureter, distal left ureter, as well as 2 additional devices in the distal  right ureter. The urinary bladder is minimally distended and contains air-fluid level. This air is likely related to recent instrumentation, however an infectious process is not excluded. Correlation with urinalysis recommended. There is diffuse thickened and hazy appearance of the bladder wall with stranding of the perivesical fat. Stomach/Bowel: An enteric tube is partially visualized with tip in the body of the  stomach. Postsurgical changes of partial colon resection with a left anterior colostomy. Contrast noted in the distal colon and rectum distal to the colostomy, likely related to rectally administered contrast. There is dilatation of fluid-filled loops of small bowel proximal to the colostomy measuring up to 5 cm in diameter. There is a probable transition in the right hemipelvis where there is abutment and tethering of loops of bowel likely representing adhesions (coronal series 4 image 58 and axial series 2, image 55). Evaluation of this area and small bowel is limited due to non opacification with oral contrast. Vascular/Lymphatic: There is moderate aortoiliac atherosclerotic disease. The abdominal aorta and IVC otherwise grossly unremarkable on this noncontrast CT. No portal venous gas. No definite adenopathy. Reproductive: Hysterectomy. There is an ill-defined 3.1 x 4.9 x 5.0 cm complex collection containing low attenuating fluid/debris and air within the pelvis in the region of the hysterectomy concerning for an infected collection or abscess. This collection may also represent a necrotic mass. There is apparent area of irregularity in the wall of the sigmoid colon (series 4, image 63 and axial series 2, image 52) which may represent an area of fistula between the sigmoid colon and this collection. Evaluation is very limited due to postsurgical changes and absence of contrast. There is diffuse infiltration of the pelvic floor fat and soft tissue infiltration of the posterior pelvis/perirectal space.  Other: There is loss of subcutaneous fat and cachexia. There is a lipoma in the anterior left proximal thigh. Musculoskeletal: Osteopenia with degenerative changes of the spine and bilateral hips arthritis. No acute osseous pathology. IMPRESSION: 1. Small-bowel obstruction with transition within the pelvis likely secondary to adhesions. 2. Ill-defined complex collection within the pelvis anterior to the rectum and posterior to the bladder may represent an infected collection/abscess or a necrotic mass. There is irregularity of the wall of the sigmoid colon in this area which may represent a fistula tract extending into the collection. Evaluation is very limited due to postsurgical changes and absence of contrast. Clinical correlation is recommended. 3. Mild fullness of the left renal collecting system and ureter. Diffuse thickening of the bladder wall with air-fluid level content. This may be reactive or related to recent instrumentation or represent cystitis. Correlation with urinalysis recommended. 4. Cholelithiasis as well as multiple stones within the common bile duct. 5.  Aortic Atherosclerosis (ICD10-I70.0). Electronically Signed   By: Anner Crete M.D.   On: 02/15/2018 21:56   Dg Abdomen Acute W/chest  Result Date: 02/15/2018 CLINICAL DATA:  Abdominal distention, evaluate for obstruction EXAM: DG ABDOMEN ACUTE W/ 1V CHEST COMPARISON:  04/22/2016 abdominal CT FINDINGS: A nasogastric tube tip overlaps the proximal stomach. There are bilateral percutaneous nephrostomy tubes. A nasogastric tube tip overlaps the proximal stomach. Dilated small bowel loops with fluid levels. No pneumoperitoneum. Bilateral ureteral occluders. Large lung volumes. There is no edema, consolidation, effusion, or pneumothorax. IMPRESSION: 1. Findings of small bowel obstruction. 2. Nasogastric tube in good position. Electronically Signed   By: Monte Fantasia M.D.   On: 02/15/2018 19:20    Procedures Procedures (including  critical care time)  Medications Ordered in ED Medications  piperacillin-tazobactam (ZOSYN) IVPB 3.375 g (3.375 g Intravenous New Bag/Given 02/15/18 2225)  sodium chloride 0.9 % bolus 1,000 mL (has no administration in time range)  sodium chloride 0.9 % bolus 1,000 mL (0 mLs Intravenous Stopped 02/15/18 2046)  ondansetron (ZOFRAN) injection 4 mg (4 mg Intravenous Given 02/15/18 1825)  morphine 4 MG/ML injection 4 mg (4 mg Intravenous Given  02/15/18 1826)  morphine 4 MG/ML injection 4 mg (4 mg Intravenous Given 02/15/18 2042)     Initial Impression / Assessment and Plan / ED Course  I have reviewed the triage vital signs and the nursing notes.  Pertinent labs & imaging results that were available during my care of the patient were reviewed by me and considered in my medical decision making (see chart for details).  Clinical Course as of Feb 16 2244  Thu Jul 11, 726  [id number] 84 year old with history of colostomy and near total colectomy here with abdominal distention, nausea, and vomiting.  Exam is concerning for small bowel obstruction.  Will place NG empirically given her vomiting, start fluids and analgesia, and obtain labs.   [CI]    Clinical Course User Index [CI] Duffy Bruce, MD    Labs, imaging reviewed. Pt with likely pre-renal AKI. LA normal, no WBC, doubt sepsis. Hgb 7.9 - MCV low, ? IDA, chronic GI loss - no active bleeding now, T&S sent. Otherwise, CT pending.  CT as above. Pt with obstruction, no perforation. ? Mass versus abscess in pelvis - pt has h/o known fistula, suspect this is related to her prior surgeries but could also be recurrence of endometrial CA. D/w Dr. Ninfa Linden of surgery who will see. Otherwise, pt improved s/p NGT. Admit for SBO protocol, fluids, work-up.  Final Clinical Impressions(s) / ED Diagnoses   Final diagnoses:  SBO (small bowel obstruction) (HCC)  Microcytic anemia  AKI (acute kidney injury) [facility])    ED Discharge Orders    None         Duffy Bruce, MD 02/15/18 2245

## 2018-02-16 ENCOUNTER — Encounter (HOSPITAL_COMMUNITY): Payer: Self-pay | Admitting: Surgery

## 2018-02-16 ENCOUNTER — Inpatient Hospital Stay (HOSPITAL_COMMUNITY): Payer: Medicare HMO

## 2018-02-16 DIAGNOSIS — K8020 Calculus of gallbladder without cholecystitis without obstruction: Secondary | ICD-10-CM | POA: Diagnosis not present

## 2018-02-16 DIAGNOSIS — R0600 Dyspnea, unspecified: Secondary | ICD-10-CM | POA: Diagnosis not present

## 2018-02-16 DIAGNOSIS — G4700 Insomnia, unspecified: Secondary | ICD-10-CM | POA: Diagnosis present

## 2018-02-16 DIAGNOSIS — I959 Hypotension, unspecified: Secondary | ICD-10-CM | POA: Diagnosis not present

## 2018-02-16 DIAGNOSIS — E870 Hyperosmolality and hypernatremia: Secondary | ICD-10-CM | POA: Diagnosis not present

## 2018-02-16 DIAGNOSIS — R531 Weakness: Secondary | ICD-10-CM | POA: Diagnosis not present

## 2018-02-16 DIAGNOSIS — N179 Acute kidney failure, unspecified: Secondary | ICD-10-CM | POA: Diagnosis not present

## 2018-02-16 DIAGNOSIS — R935 Abnormal findings on diagnostic imaging of other abdominal regions, including retroperitoneum: Secondary | ICD-10-CM | POA: Diagnosis not present

## 2018-02-16 DIAGNOSIS — R69 Illness, unspecified: Secondary | ICD-10-CM | POA: Diagnosis not present

## 2018-02-16 DIAGNOSIS — Z923 Personal history of irradiation: Secondary | ICD-10-CM | POA: Diagnosis not present

## 2018-02-16 DIAGNOSIS — Z933 Colostomy status: Secondary | ICD-10-CM | POA: Diagnosis not present

## 2018-02-16 DIAGNOSIS — R0602 Shortness of breath: Secondary | ICD-10-CM | POA: Diagnosis not present

## 2018-02-16 DIAGNOSIS — M545 Low back pain: Secondary | ICD-10-CM | POA: Diagnosis not present

## 2018-02-16 DIAGNOSIS — F17210 Nicotine dependence, cigarettes, uncomplicated: Secondary | ICD-10-CM | POA: Diagnosis present

## 2018-02-16 DIAGNOSIS — Z85038 Personal history of other malignant neoplasm of large intestine: Secondary | ICD-10-CM | POA: Diagnosis not present

## 2018-02-16 DIAGNOSIS — K8050 Calculus of bile duct without cholangitis or cholecystitis without obstruction: Secondary | ICD-10-CM | POA: Diagnosis not present

## 2018-02-16 DIAGNOSIS — C549 Malignant neoplasm of corpus uteri, unspecified: Secondary | ICD-10-CM | POA: Diagnosis not present

## 2018-02-16 DIAGNOSIS — R109 Unspecified abdominal pain: Secondary | ICD-10-CM | POA: Diagnosis present

## 2018-02-16 DIAGNOSIS — N184 Chronic kidney disease, stage 4 (severe): Secondary | ICD-10-CM | POA: Diagnosis not present

## 2018-02-16 DIAGNOSIS — Z515 Encounter for palliative care: Secondary | ICD-10-CM | POA: Diagnosis not present

## 2018-02-16 DIAGNOSIS — Z7401 Bed confinement status: Secondary | ICD-10-CM | POA: Diagnosis not present

## 2018-02-16 DIAGNOSIS — R2689 Other abnormalities of gait and mobility: Secondary | ICD-10-CM | POA: Diagnosis not present

## 2018-02-16 DIAGNOSIS — Z681 Body mass index (BMI) 19 or less, adult: Secondary | ICD-10-CM | POA: Diagnosis not present

## 2018-02-16 DIAGNOSIS — E876 Hypokalemia: Secondary | ICD-10-CM | POA: Diagnosis not present

## 2018-02-16 DIAGNOSIS — D509 Iron deficiency anemia, unspecified: Secondary | ICD-10-CM | POA: Diagnosis not present

## 2018-02-16 DIAGNOSIS — Z90710 Acquired absence of both cervix and uterus: Secondary | ICD-10-CM | POA: Diagnosis not present

## 2018-02-16 DIAGNOSIS — Z8542 Personal history of malignant neoplasm of other parts of uterus: Secondary | ICD-10-CM | POA: Diagnosis not present

## 2018-02-16 DIAGNOSIS — E43 Unspecified severe protein-calorie malnutrition: Secondary | ICD-10-CM | POA: Diagnosis not present

## 2018-02-16 DIAGNOSIS — R140 Abdominal distension (gaseous): Secondary | ICD-10-CM | POA: Diagnosis not present

## 2018-02-16 DIAGNOSIS — Z9889 Other specified postprocedural states: Secondary | ICD-10-CM

## 2018-02-16 DIAGNOSIS — R1084 Generalized abdominal pain: Secondary | ICD-10-CM | POA: Diagnosis not present

## 2018-02-16 DIAGNOSIS — N135 Crossing vessel and stricture of ureter without hydronephrosis: Secondary | ICD-10-CM | POA: Diagnosis not present

## 2018-02-16 DIAGNOSIS — M810 Age-related osteoporosis without current pathological fracture: Secondary | ICD-10-CM | POA: Diagnosis present

## 2018-02-16 DIAGNOSIS — Z7189 Other specified counseling: Secondary | ICD-10-CM | POA: Diagnosis not present

## 2018-02-16 DIAGNOSIS — E8809 Other disorders of plasma-protein metabolism, not elsewhere classified: Secondary | ICD-10-CM | POA: Diagnosis present

## 2018-02-16 DIAGNOSIS — R110 Nausea: Secondary | ICD-10-CM | POA: Diagnosis not present

## 2018-02-16 DIAGNOSIS — J90 Pleural effusion, not elsewhere classified: Secondary | ICD-10-CM | POA: Diagnosis not present

## 2018-02-16 DIAGNOSIS — R5381 Other malaise: Secondary | ICD-10-CM | POA: Diagnosis not present

## 2018-02-16 DIAGNOSIS — C187 Malignant neoplasm of sigmoid colon: Secondary | ICD-10-CM

## 2018-02-16 DIAGNOSIS — M2550 Pain in unspecified joint: Secondary | ICD-10-CM | POA: Diagnosis not present

## 2018-02-16 DIAGNOSIS — Z936 Other artificial openings of urinary tract status: Secondary | ICD-10-CM | POA: Diagnosis not present

## 2018-02-16 DIAGNOSIS — D638 Anemia in other chronic diseases classified elsewhere: Secondary | ICD-10-CM | POA: Diagnosis present

## 2018-02-16 DIAGNOSIS — K5651 Intestinal adhesions [bands], with partial obstruction: Secondary | ICD-10-CM | POA: Diagnosis not present

## 2018-02-16 DIAGNOSIS — R29898 Other symptoms and signs involving the musculoskeletal system: Secondary | ICD-10-CM | POA: Diagnosis not present

## 2018-02-16 DIAGNOSIS — N183 Chronic kidney disease, stage 3 unspecified: Secondary | ICD-10-CM | POA: Diagnosis present

## 2018-02-16 DIAGNOSIS — R278 Other lack of coordination: Secondary | ICD-10-CM | POA: Diagnosis not present

## 2018-02-16 DIAGNOSIS — M6281 Muscle weakness (generalized): Secondary | ICD-10-CM | POA: Diagnosis not present

## 2018-02-16 DIAGNOSIS — R001 Bradycardia, unspecified: Secondary | ICD-10-CM | POA: Diagnosis not present

## 2018-02-16 DIAGNOSIS — I361 Nonrheumatic tricuspid (valve) insufficiency: Secondary | ICD-10-CM | POA: Diagnosis not present

## 2018-02-16 DIAGNOSIS — F329 Major depressive disorder, single episode, unspecified: Secondary | ICD-10-CM | POA: Diagnosis present

## 2018-02-16 DIAGNOSIS — I495 Sick sinus syndrome: Secondary | ICD-10-CM | POA: Diagnosis not present

## 2018-02-16 DIAGNOSIS — R498 Other voice and resonance disorders: Secondary | ICD-10-CM | POA: Diagnosis not present

## 2018-02-16 DIAGNOSIS — G8929 Other chronic pain: Secondary | ICD-10-CM | POA: Diagnosis present

## 2018-02-16 DIAGNOSIS — K56609 Unspecified intestinal obstruction, unspecified as to partial versus complete obstruction: Secondary | ICD-10-CM | POA: Diagnosis not present

## 2018-02-16 DIAGNOSIS — N823 Fistula of vagina to large intestine: Secondary | ICD-10-CM | POA: Diagnosis not present

## 2018-02-16 DIAGNOSIS — R41841 Cognitive communication deficit: Secondary | ICD-10-CM | POA: Diagnosis not present

## 2018-02-16 DIAGNOSIS — K603 Anal fistula: Secondary | ICD-10-CM | POA: Diagnosis not present

## 2018-02-16 DIAGNOSIS — K565 Intestinal adhesions [bands], unspecified as to partial versus complete obstruction: Secondary | ICD-10-CM | POA: Diagnosis not present

## 2018-02-16 DIAGNOSIS — K651 Peritoneal abscess: Secondary | ICD-10-CM | POA: Diagnosis not present

## 2018-02-16 DIAGNOSIS — E875 Hyperkalemia: Secondary | ICD-10-CM | POA: Diagnosis present

## 2018-02-16 HISTORY — DX: Malignant neoplasm of sigmoid colon: C18.7

## 2018-02-16 LAB — CBC
HCT: 21.3 % — ABNORMAL LOW (ref 36.0–46.0)
HCT: 22.7 % — ABNORMAL LOW (ref 36.0–46.0)
HEMOGLOBIN: 6.1 g/dL — AB (ref 12.0–15.0)
Hemoglobin: 6.6 g/dL — CL (ref 12.0–15.0)
MCH: 21 pg — AB (ref 26.0–34.0)
MCH: 21.2 pg — ABNORMAL LOW (ref 26.0–34.0)
MCHC: 28.6 g/dL — ABNORMAL LOW (ref 30.0–36.0)
MCHC: 29.1 g/dL — ABNORMAL LOW (ref 30.0–36.0)
MCV: 72.8 fL — ABNORMAL LOW (ref 78.0–100.0)
MCV: 73.2 fL — ABNORMAL LOW (ref 78.0–100.0)
PLATELETS: 307 10*3/uL (ref 150–400)
Platelets: 298 10*3/uL (ref 150–400)
RBC: 2.91 MIL/uL — AB (ref 3.87–5.11)
RBC: 3.12 MIL/uL — ABNORMAL LOW (ref 3.87–5.11)
RDW: 18.5 % — ABNORMAL HIGH (ref 11.5–15.5)
RDW: 18.5 % — AB (ref 11.5–15.5)
WBC: 6 10*3/uL (ref 4.0–10.5)
WBC: 6.7 10*3/uL (ref 4.0–10.5)

## 2018-02-16 LAB — BASIC METABOLIC PANEL
ANION GAP: 11 (ref 5–15)
BUN: 37 mg/dL — ABNORMAL HIGH (ref 8–23)
CHLORIDE: 113 mmol/L — AB (ref 98–111)
CO2: 20 mmol/L — AB (ref 22–32)
Calcium: 8.5 mg/dL — ABNORMAL LOW (ref 8.9–10.3)
Creatinine, Ser: 1.69 mg/dL — ABNORMAL HIGH (ref 0.44–1.00)
GFR calc non Af Amer: 26 mL/min — ABNORMAL LOW (ref >60)
GFR, EST AFRICAN AMERICAN: 30 mL/min — AB (ref >60)
Glucose, Bld: 71 mg/dL (ref 70–99)
Potassium: 3.1 mmol/L — ABNORMAL LOW (ref 3.5–5.1)
Sodium: 144 mmol/L (ref 135–145)

## 2018-02-16 LAB — ABO/RH: ABO/RH(D): O NEG

## 2018-02-16 LAB — IRON AND TIBC
IRON: 8 ug/dL — AB (ref 28–170)
SATURATION RATIOS: 4 % — AB (ref 10.4–31.8)
TIBC: 184 ug/dL — ABNORMAL LOW (ref 250–450)
UIBC: 176 ug/dL

## 2018-02-16 LAB — PREPARE RBC (CROSSMATCH)

## 2018-02-16 LAB — HEMOGLOBIN AND HEMATOCRIT, BLOOD
HEMATOCRIT: 35.5 % — AB (ref 36.0–46.0)
Hemoglobin: 10.8 g/dL — ABNORMAL LOW (ref 12.0–15.0)

## 2018-02-16 LAB — OCCULT BLOOD X 1 CARD TO LAB, STOOL: Fecal Occult Bld: NEGATIVE

## 2018-02-16 LAB — FERRITIN: FERRITIN: 19 ng/mL (ref 11–307)

## 2018-02-16 MED ORDER — CHLORHEXIDINE GLUCONATE 0.12 % MT SOLN
15.0000 mL | Freq: Two times a day (BID) | OROMUCOSAL | Status: DC
  Administered 2018-02-16 – 2018-02-28 (×13): 15 mL via OROMUCOSAL
  Filled 2018-02-16 (×16): qty 15

## 2018-02-16 MED ORDER — SODIUM CHLORIDE 0.9% IV SOLUTION
Freq: Once | INTRAVENOUS | Status: AC
  Administered 2018-02-16: 12:00:00 via INTRAVENOUS

## 2018-02-16 MED ORDER — FAMOTIDINE IN NACL 20-0.9 MG/50ML-% IV SOLN
20.0000 mg | INTRAVENOUS | Status: DC
  Administered 2018-02-17 – 2018-02-20 (×4): 20 mg via INTRAVENOUS
  Filled 2018-02-16 (×4): qty 50

## 2018-02-16 MED ORDER — LORAZEPAM 2 MG/ML IJ SOLN
2.0000 mg | Freq: Once | INTRAMUSCULAR | Status: AC
  Administered 2018-02-16: 2 mg via INTRAVENOUS
  Filled 2018-02-16: qty 1

## 2018-02-16 MED ORDER — ONDANSETRON HCL 4 MG PO TABS: 4.0000 mg | ORAL_TABLET | Freq: Four times a day (QID) | ORAL | Status: DC | PRN

## 2018-02-16 MED ORDER — FAMOTIDINE IN NACL 20-0.9 MG/50ML-% IV SOLN
20.0000 mg | Freq: Two times a day (BID) | INTRAVENOUS | Status: DC
  Administered 2018-02-16 (×2): 20 mg via INTRAVENOUS
  Filled 2018-02-16 (×2): qty 50

## 2018-02-16 MED ORDER — PIPERACILLIN-TAZOBACTAM IN DEX 2-0.25 GM/50ML IV SOLN
2.2500 g | Freq: Four times a day (QID) | INTRAVENOUS | Status: DC
  Administered 2018-02-16 – 2018-02-20 (×18): 2.25 g via INTRAVENOUS
  Filled 2018-02-16 (×18): qty 50

## 2018-02-16 MED ORDER — POTASSIUM CHLORIDE IN NACL 20-0.9 MEQ/L-% IV SOLN
INTRAVENOUS | Status: DC
  Administered 2018-02-16 – 2018-02-17 (×3): via INTRAVENOUS
  Filled 2018-02-16 (×3): qty 1000

## 2018-02-16 MED ORDER — ONDANSETRON HCL 4 MG/2ML IJ SOLN
4.0000 mg | Freq: Four times a day (QID) | INTRAMUSCULAR | Status: DC | PRN
  Administered 2018-02-20: 4 mg via INTRAVENOUS
  Filled 2018-02-16: qty 2

## 2018-02-16 MED ORDER — MAGNESIUM SULFATE 2 GM/50ML IV SOLN
2.0000 g | Freq: Once | INTRAVENOUS | Status: AC
  Administered 2018-02-16: 2 g via INTRAVENOUS
  Filled 2018-02-16: qty 50

## 2018-02-16 MED ORDER — POTASSIUM CHLORIDE 10 MEQ/100ML IV SOLN
10.0000 meq | INTRAVENOUS | Status: AC
  Administered 2018-02-16 (×6): 10 meq via INTRAVENOUS
  Filled 2018-02-16 (×6): qty 100

## 2018-02-16 MED ORDER — SODIUM CHLORIDE 0.9% FLUSH
3.0000 mL | Freq: Two times a day (BID) | INTRAVENOUS | Status: DC
  Administered 2018-02-16 – 2018-02-28 (×21): 3 mL via INTRAVENOUS
  Administered 2018-03-01: 10 mL via INTRAVENOUS

## 2018-02-16 MED ORDER — HYDROMORPHONE HCL 1 MG/ML IJ SOLN
0.5000 mg | INTRAMUSCULAR | Status: DC | PRN
  Administered 2018-02-16 – 2018-02-24 (×21): 0.5 mg via INTRAVENOUS
  Filled 2018-02-16 (×3): qty 1
  Filled 2018-02-16: qty 0.5
  Filled 2018-02-16 (×2): qty 1
  Filled 2018-02-16: qty 0.5
  Filled 2018-02-16: qty 1
  Filled 2018-02-16: qty 0.5
  Filled 2018-02-16 (×12): qty 1

## 2018-02-16 MED ORDER — ORAL CARE MOUTH RINSE
15.0000 mL | Freq: Two times a day (BID) | OROMUCOSAL | Status: DC
  Administered 2018-02-18 – 2018-02-19 (×3): 15 mL via OROMUCOSAL

## 2018-02-16 MED ORDER — DIATRIZOATE MEGLUMINE & SODIUM 66-10 % PO SOLN
90.0000 mL | Freq: Once | ORAL | Status: AC
  Administered 2018-02-16: 90 mL via NASOGASTRIC
  Filled 2018-02-16: qty 90

## 2018-02-16 MED ORDER — HEPARIN SODIUM (PORCINE) 5000 UNIT/ML IJ SOLN
5000.0000 [IU] | Freq: Three times a day (TID) | INTRAMUSCULAR | Status: DC
  Administered 2018-02-16: 5000 [IU] via SUBCUTANEOUS
  Filled 2018-02-16: qty 1

## 2018-02-16 NOTE — H&P (Signed)
History and Physical    BLAKLEIGH STRAW IZT:[id number] DOB: [birth year] DOA: 02/15/2018  PCP: Cassandria Anger, MD  Patient coming from: home  I have personally briefly reviewed patient's old medical records in [place]  Chief Complaint: abdominal pain and distension  HPI: [name] is a 84 y.o. female with medical history significant of endometrial cancer s/p hysterectomy, sigmoidal cancer s/p partial bowel resection, ileostomy and colostomy bad placement, nephrolithiasis s/p bilateral nephrostomy tube placement,  Hyponatremia, severe malnutrition, and insomnia, p/w worsening abdominal pain and distension since last week. About a week ago, pt has noticed the decrease of colostomy output and gradual abdominal distension. She could initially still keep food down. However the symptoms got worse over time, she only had very little output this morning from colostomy bag, has been having severe abdominal cramps, felt her abdomen is very distended, no flatulus, and is nauseated with anorexia and no appetite at home. She did not vomit but felt bad enough to check into ER for further evaluation. She denies fever, chills, SOB, cough, or other associated symptoms. She has never had small bowel obstruction in the past despite having extensive surgical histories. She did not see black stool in colostomy bag   ED Course:  In ER, KUB showed small bowel obstruction and NG tube was placed with significant amount of gastric output. Subsequent CT abdomen pelvis wo contrast showed: "Small-bowel obstruction with transition within the pelvis likely secondary to adhesions; Ill-defined complex collection within the pelvis anterior to the rectum and posterior to the bladder may represent an infected collection/abscess or a necrotic mass. There is irregularity of the wall of the sigmoid colon in this area which may represent a fistula tract extending into the collection. Evaluation is very limited due to  postsurgical changes and absence of contrast. Clinical correlation is recommended; Mild fullness of the left renal collecting system and ureter. Diffuse thickening of the bladder wall with air-fluid level content. This may be reactive or related to recent instrumentation or represent cystitis. Correlation with urinalysis recommended; Cholelithiasis as well as multiple stones within the common bile Duct." surgery consulted by ER and recommendations pending. Labs showed K 3.4. BUN 40 (from 21 in 2018), Cr 1.92 (from 1.23 in 2018), albumin 2.6, normal lactic acid, wbc 7.2, Hb 7.9 (from 10.7 in 2018), MCV 72.8, platelet 371. Pt is on IVF and iv morphine PRN for pain control in ER.   Review of Systems: As per HPI otherwise 10 point review of systems negative.     Past Medical History:  Diagnosis Date  . Anal fistula   . Anemia   . Arthritis   . Colon perforation ([place])   . Colon polyp   . Colostomy in place [facility])   . Depression   . Endometrial cancer ([place])   . Fistula    vesicocolonic  . History of colon cancer   . History of nephrolithiasis   . LBP (low back pain)   . Malnutrition ([place])   . Nephrostomy status ([place])    replaced every 6-8 weeks  . Osteoporosis    Pt declined Rx  . 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 Small bowel obstruction ([place])   . Ureteral stenosis   . Vitamin B12 deficiency   . Vitamin D deficiency     Past Surgical History:  Procedure Laterality Date  . ABCESS DRAINAGE    . ABDOMINAL HYSTERECTOMY    . COLOSTOMY  07/12/2006   for colovesicle fistula -  Dr Margot Chimes  . COLOSTOMY TAKEDOWN  07/17/2006   Dr Margot Chimes  . COLOSTOMY TAKEDOWN  01/13/2005   closed iliostomy -Dr Margot Chimes  . CYSTOSCOPY WITH LITHOLAPAXY N/A 04/17/2014   Procedure: CYSTOSCOPY WITH LITHOLAPAXY;  Surgeon: Jorja Loa, MD;  Location: [facility];  Service: Urology;  Laterality: N/A;  . iliostomy  2004   Done due to perf cecum at colonsocpy sone in [place]  . iliostomy revision  09/18/2002    for stenosis - Dr Margot Chimes  . LOW ANTERIOR BOWEL RESECTION  07/09/1988   For sigmoid cancer - Dr Margot Chimes  . NEPHROSTOMY  2008   bilateral- Dr. Jules Schick  . TONSILLECTOMY    . ureteral blockage      04/05/14   . URINARY DIVERSION       reports that she has been smoking cigarettes.  She has been smoking about 0.25 packs per day. She has never used smokeless tobacco. She reports that she drinks alcohol. She reports that she does not use drugs.  Allergies  Allergen Reactions  . Ciprofloxacin Other (See Comments)    Stomach   . Flagyl [Metronidazole Hcl]     nausea  . Gabapentin     unknown  . Raloxifene     unknown  . Venlafaxine     Family History  Problem Relation Age of Onset  . Heart disease Father   . Hypertension Other   . Kidney disease Other       Prior to Admission medications   Medication Sig Start Date End Date Taking? Authorizing Provider  Cholecalciferol (VITAMIN D3) 1000 UNITS tablet Take 1,000-2,000 Units by mouth 2 (two) times daily. 1,000 units at lunch and 2,000 units at dinner   Yes [provider]  Cyanocobalamin (VITAMIN B-12 PO) Place 3,000 mcg under the tongue daily.   Yes [provider]  furosemide (LASIX) 20 MG tablet TAKE 1 TABLET (20 MG TOTAL) BY MOUTH DAILY AS NEEDED. FOR SWELLING 12/04/17  Yes Plotnikov, Evie Lacks, MD  LUTEIN PO Take by mouth.   Yes [provider]  mirtazapine (REMERON) 15 MG tablet Take 1 tablet (15 mg total) by mouth at bedtime. 02/14/18  Yes Plotnikov, Evie Lacks, MD  oxyCODONE-acetaminophen (PERCOCET) 10-325 MG tablet Take 1 tablet by mouth every 6 (six) hours as needed for pain. 02/14/18  Yes Plotnikov, Evie Lacks, MD  polyethylene glycol powder (GLYCOLAX/MIRALAX) powder Take 255 g by mouth once. 04/01/15  Yes Plotnikov, Evie Lacks, MD  Probiotic Product (ALIGN) 4 MG CAPS Take 4 mg by mouth daily. For intestinal flora restoration   Yes [provider]  temazepam (RESTORIL) 30 MG capsule TAKE 1  CAPSULE BY MOUTH AT BEDTIME AS NEEDED FOR SLEEP 02/14/18  Yes Plotnikov, Evie Lacks, MD    Physical Exam: Vitals:   02/15/18 1740 02/15/18 2035 02/15/18 2100  BP: 92/66 (!) 105/49 (!) 97/56  Pulse: 100 79 76  Resp: 18    Temp: 98.1 F (36.7 C)    TempSrc: Oral    SpO2: 99% 99% 99%  Weight: 41.7 kg (92 lb)    Height: 5\' 5"  (1.651 m)      Constitutional: NAD, calm, comfortable Vitals:   02/15/18 1740 02/15/18 2035 02/15/18 2100  BP: 92/66 (!) 105/49 (!) 97/56  Pulse: 100 79 76  Resp: 18    Temp: 98.1 F (36.7 C)    TempSrc: Oral    SpO2: 99% 99% 99%  Weight: 41.7 kg (92 lb)    Height: 5\' 5"  (1.651 m)  General: cachectic female, ill appearance, no acute distress though Eyes: PERRL, lids normal; conjunctival pallor ENMT: Mucous membranes are moist. Posterior pharynx clear of any exudate or lesions.Normal dentition. NG tube in place Neck: normal, supple, no masses, no thyromegaly Respiratory: clear to auscultation bilaterally, no wheezing, no crackles. Normal respiratory effort. No accessory muscle use.  Cardiovascular: Regular rate and rhythm, no murmurs / rubs / gallops. No extremity edema. 2+ pedal pulses. No carotid bruits.  Abdomen: diffuse tenderness, abdomen mildly distended, there's guarding, no masses palpated. No hepatosplenomegaly. Bowel sounds very active.  Musculoskeletal: no clubbing / cyanosis. No joint deformity upper and lower extremities. Good ROM, no contractures. Normal muscle tone.  Skin: no rashes, lesions, ulcers. No induration Neurologic: CN 2-12 grossly intact. Sensation intact, DTR normal. Strength 5/5 in all 4.  Psychiatric: Normal judgment and insight. Alert and oriented x 3. Normal mood.      Labs on Admission: I have personally reviewed following labs and imaging studies  CBC: Recent Labs  Lab 02/15/18 1819  WBC 7.2  NEUTROABS 6.0  HGB 7.9*  HCT 27.5*  MCV 72.8*  PLT 902   Basic Metabolic Panel: Recent Labs  Lab 02/15/18 1819  NA  140  K 3.4*  CL 108  CO2 21*  GLUCOSE 111*  BUN 40*  CREATININE 1.92*  CALCIUM 9.4   GFR: Estimated Creatinine Clearance: 13.3 mL/min (A) (by C-G formula based on SCr of 1.92 mg/dL (H)). Liver Function Tests: Recent Labs  Lab 02/15/18 1819  AST 13*  ALT 13  ALKPHOS 119  BILITOT 0.6  PROT 6.3*  ALBUMIN 2.6*   Recent Labs  Lab 02/15/18 1819  LIPASE 20   No results for input(s): AMMONIA in the last 168 hours. Coagulation Profile: No results for input(s): INR, PROTIME in the last 168 hours. Cardiac Enzymes: No results for input(s): CKTOTAL, CKMB, CKMBINDEX, TROPONINI in the last 168 hours. BNP (last 3 results) No results for input(s): PROBNP in the last 8760 hours. HbA1C: No results for input(s): HGBA1C in the last 72 hours. CBG: No results for input(s): GLUCAP in the last 168 hours. Lipid Profile: No results for input(s): CHOL, HDL, LDLCALC, TRIG, CHOLHDL, LDLDIRECT in the last 72 hours. Thyroid Function Tests: No results for input(s): TSH, T4TOTAL, FREET4, T3FREE, THYROIDAB in the last 72 hours. Anemia Panel: No results for input(s): VITAMINB12, FOLATE, FERRITIN, TIBC, IRON, RETICCTPCT in the last 72 hours. Urine analysis:    Component Value Date/Time   COLORURINE Yellow 03/07/2012 1453   APPEARANCEUR CLOUDY 03/07/2012 1453   LABSPEC >=1.030 03/07/2012 1453   PHURINE 6.0 03/07/2012 1453   GLUCOSEU NEGATIVE 03/07/2012 1453   HGBUR MODERATE 03/07/2012 1453   BILIRUBINUR NEGATIVE 03/07/2012 1453   KETONESUR NEGATIVE 03/07/2012 1453   PROTEINUR >300 (A) 12/07/2007 1146   UROBILINOGEN 0.2 03/07/2012 1453   NITRITE NEGATIVE 03/07/2012 1453   LEUKOCYTESUR LARGE 03/07/2012 1453    Radiological Exams on Admission: Ct Abdomen Pelvis Wo Contrast  Result Date: 02/15/2018 CLINICAL DATA:  84 year old female with history of colon cancer and colostomy presenting with obstruction. EXAM: CT ABDOMEN AND PELVIS WITHOUT CONTRAST TECHNIQUE: Multidetector CT imaging of the  abdomen and pelvis was performed following the standard protocol without IV contrast. COMPARISON:  Abdominal radiograph dated 02/15/2018. FINDINGS: Evaluation of this exam is limited in the absence of intravenous contrast. Lower chest: The visualized lung bases are clear. There is hypoattenuation of the cardiac blood pool suggestive of a degree of anemia. Clinical correlation is recommended. No intra-abdominal free air  or free fluid. Hepatobiliary: The liver is unremarkable on this noncontrast CT. No intrahepatic biliary ductal dilatation. There are multiple stones within the gallbladder with the largest stone measuring approximately 3 cm in length. Ultrasound may provide better evaluation of the gallbladder if clinically indicated. No pericholecystic fluid. Multiple large stones noted common bile duct. These stones measure up to 11 mm in diameter. Pancreas: Unremarkable. No pancreatic ductal dilatation or surrounding inflammatory changes. Spleen: Normal in size without focal abnormality. Adrenals/Urinary Tract: The adrenal glands are unremarkable. Bilateral percutaneous nephrostomy tubes noted. The tip of the left nephrostomy tube is in the interpolar collecting system of the left kidney. There is mild fullness of the left renal collecting system and left ureter. There is no hydronephrosis on the right. Mechanical or Amplatzer device noted in the proximal left ureter, distal left ureter, as well as 2 additional devices in the distal right ureter. The urinary bladder is minimally distended and contains air-fluid level. This air is likely related to recent instrumentation, however an infectious process is not excluded. Correlation with urinalysis recommended. There is diffuse thickened and hazy appearance of the bladder wall with stranding of the perivesical fat. Stomach/Bowel: An enteric tube is partially visualized with tip in the body of the stomach. Postsurgical changes of partial colon resection with a left  anterior colostomy. Contrast noted in the distal colon and rectum distal to the colostomy, likely related to rectally administered contrast. There is dilatation of fluid-filled loops of small bowel proximal to the colostomy measuring up to 5 cm in diameter. There is a probable transition in the right hemipelvis where there is abutment and tethering of loops of bowel likely representing adhesions (coronal series 4 image 58 and axial series 2, image 55). Evaluation of this area and small bowel is limited due to non opacification with oral contrast. Vascular/Lymphatic: There is moderate aortoiliac atherosclerotic disease. The abdominal aorta and IVC otherwise grossly unremarkable on this noncontrast CT. No portal venous gas. No definite adenopathy. Reproductive: Hysterectomy. There is an ill-defined 3.1 x 4.9 x 5.0 cm complex collection containing low attenuating fluid/debris and air within the pelvis in the region of the hysterectomy concerning for an infected collection or abscess. This collection may also represent a necrotic mass. There is apparent area of irregularity in the wall of the sigmoid colon (series 4, image 63 and axial series 2, image 52) which may represent an area of fistula between the sigmoid colon and this collection. Evaluation is very limited due to postsurgical changes and absence of contrast. There is diffuse infiltration of the pelvic floor fat and soft tissue infiltration of the posterior pelvis/perirectal space. Other: There is loss of subcutaneous fat and cachexia. There is a lipoma in the anterior left proximal thigh. Musculoskeletal: Osteopenia with degenerative changes of the spine and bilateral hips arthritis. No acute osseous pathology. IMPRESSION: 1. Small-bowel obstruction with transition within the pelvis likely secondary to adhesions. 2. Ill-defined complex collection within the pelvis anterior to the rectum and posterior to the bladder may represent an infected collection/abscess  or a necrotic mass. There is irregularity of the wall of the sigmoid colon in this area which may represent a fistula tract extending into the collection. Evaluation is very limited due to postsurgical changes and absence of contrast. Clinical correlation is recommended. 3. Mild fullness of the left renal collecting system and ureter. Diffuse thickening of the bladder wall with air-fluid level content. This may be reactive or related to recent instrumentation or represent cystitis. Correlation with urinalysis  recommended. 4. Cholelithiasis as well as multiple stones within the common bile duct. 5.  Aortic Atherosclerosis (ICD10-I70.0). Electronically Signed   By: Anner Crete M.D.   On: 02/15/2018 21:56   Dg Abdomen Acute W/chest  Result Date: 02/15/2018 CLINICAL DATA:  Abdominal distention, evaluate for obstruction EXAM: DG ABDOMEN ACUTE W/ 1V CHEST COMPARISON:  04/22/2016 abdominal CT FINDINGS: A nasogastric tube tip overlaps the proximal stomach. There are bilateral percutaneous nephrostomy tubes. A nasogastric tube tip overlaps the proximal stomach. Dilated small bowel loops with fluid levels. No pneumoperitoneum. Bilateral ureteral occluders. Large lung volumes. There is no edema, consolidation, effusion, or pneumothorax. IMPRESSION: 1. Findings of small bowel obstruction. 2. Nasogastric tube in good position. Electronically Signed   By: Monte Fantasia M.D.   On: 02/15/2018 19:20    EKG: none  Assessment/Plan Principal Problem:   SBO (small bowel obstruction) (HCC) Active Problems:   CANCER, ENDOMETRIUM   FISTULA, ANAL   Abdominal pain, generalized   Insomnia   Colostomy in place [facility])   Nausea   Abdominal distension   Cholelithiasis   Choledocholithiasis   Cancer of sigmoid colon (HCC)   H/O abdominal hysterectomy   H/O ileostomy   Abnormal CT of the abdomen   Hypokalemia   Hypoalbuminemia   Severe protein-calorie malnutrition (HCC)   Microcytic anemia   Acute renal failure  superimposed on stage 3 chronic kidney disease (HCC)   Hypotension   Plan: -admit to telemetry monitoring -continue to NGT suctioning; iv hydration given mild hypotension and acute on chronic renal failure and NPO status -iv PRN dilaudid (will avoid morphine due to acute on chronic renal failure) -SBO is most likely related to adhesion from previous surgeries; however, there's abnormal findings on CT a/p with possible necrotic mass vs abscess, pending surgical evaluation and recommendation; for now, will empirically initiate iv zosyn to cover both gram negative and anaerotics, even though pt is afebrile and wbc is normal; blood culture sent, pending result -hold off all po home meds -iv ativan once for sleep as pt takes Restoril at home for insomnia -cholelithiasis and choledocholithiasis are incidental findings and not related to current SBO, no need to treat -replete potassium via iv -iron profile for microcytic anemia; repeat CBC in am; consider transfusion given it's a significant drop from baseline, will empirically start iv pepcid (Producer, television/film/video of iv protonix)    DVT prophylaxis: heparin [place]  Code Status: full code   Family Communication: no family at bedside  Disposition Plan: to be determined  Consults called: general surgery by ER Admission status: tele   Paticia Stack MD [facility] Pager [phone]  If 7PM-7AM, please contact night-coverage [email or web address] Password [email or web address]  02/16/2018, 12:30 AM

## 2018-02-16 NOTE — Progress Notes (Signed)
PHARMACY NOTE:  ANTIMICROBIAL RENAL DOSAGE ADJUSTMENT  Current antimicrobial regimen includes a mismatch between antimicrobial dosage and estimated renal function.  As per policy approved by the Pharmacy & Therapeutics and Medical Executive Committees, the antimicrobial dosage will be adjusted accordingly.  Current antimicrobial dosage:  Zosyn 3.375 Gm IV q8h  Indication: IAI  Renal Function:  Estimated Creatinine Clearance: 13.3 mL/min (A) (by C-G formula based on SCr of 1.92 mg/dL (H)). []      On intermittent HD, scheduled: []      On CRRT    Antimicrobial dosage has been changed to:  Zosyn 2.25 gm IV q6h     Thank you for allowing pharmacy to be a part of this patient's care.  Dorrene German, [facility] 02/16/2018 12:14 AM

## 2018-02-16 NOTE — ED Notes (Signed)
ED TO INPATIENT HANDOFF REPORT  Name/Age/Gender [name] 84 y.o. female  Code Status    Code Status Orders  (From admission, onward)        Start     Ordered   02/16/18 0014  Full code  Continuous     02/16/18 0015    Code Status History    Date Active Date Inactive Code Status Order ID Comments User Context   03/05/2014 1554 03/06/2014 0331 Full Code [id number]  Jacqulynn Cadet, MD [facility]    Advance Directive Documentation     Most Recent Value  Type of Advance Directive  Healthcare Power of Attorney, Living will  Pre-existing out of facility DNR order (yellow form or pink MOST form)  -  "MOST" Form in Place?  -      Home/SNF/Other Home  Chief Complaint 0  Level of Care/Admitting Diagnosis ED Disposition    ED Disposition Condition [place]: [place] [[id number]]  Level of Care: Telemetry [5]  Admit to tele based on following criteria: Other see comments  Comments: hypotension  Diagnosis: SBO (small bowel obstruction) [facility]) [[id number]]  Admitting Physician: Paticia Stack [[id number]]  Attending Physician: Paticia Stack [[id number]]  Estimated length of stay: 5 - 7 days  Certification:: I certify this patient will need inpatient services for at least 2 midnights  PT Class (Do Not Modify): Inpatient [101]  PT Acc Code (Do Not Modify): Private [1]       Medical History Past Medical History:  Diagnosis Date  . Anal fistula   . Anemia   . Arthritis   . Colon perforation ([place])   . Colon polyp   . Colostomy in place [facility])   . Depression   . Endometrial cancer ([place])   . Fistula    vesicocolonic  . History of colon cancer   . History of nephrolithiasis   . LBP (low back pain)   . Malnutrition ([place])   . Nephrostomy status ([place])    replaced every 6-8 weeks  . Osteoporosis    Pt declined Rx  . 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 Small bowel obstruction ([place])   . Ureteral stenosis   . Vitamin B12 deficiency   .  Vitamin D deficiency     Allergies Allergies  Allergen Reactions  . Ciprofloxacin Other (See Comments)    Stomach   . Flagyl [Metronidazole Hcl]     nausea  . Gabapentin     unknown  . Raloxifene     unknown  . Venlafaxine     IV Location/Drains/Wounds Patient Lines/Drains/Airways Status   Active Line/Drains/Airways    Name:   Placement date:   Placement time:   Site:   Days:   Peripheral IV 02/15/18 Right Antecubital   02/15/18    1820    Antecubital   1   NG/OG Tube Nasogastric 16 Fr. Right nare Xray Measured external length of tube   02/15/18    1845    Right nare   1   Incision (Closed) 04/17/14 Perineum Other (Comment)   04/17/14    0906     1401          Labs/Imaging Results for orders placed or performed during the hospital encounter of 02/15/18 (from the past 48 hour(s))  CBC with Differential     Status: Abnormal   Collection Time: 02/15/18  6:19 PM  Result Value Ref Range   WBC 7.2  4.0 - 10.5 K/uL   RBC 3.78 (L) 3.87 - 5.11 MIL/uL   Hemoglobin 7.9 (L) 12.0 - 15.0 g/dL   HCT 27.5 (L) 36.0 - 46.0 %   MCV 72.8 (L) 78.0 - 100.0 fL   MCH 20.9 (L) 26.0 - 34.0 pg   MCHC 28.7 (L) 30.0 - 36.0 g/dL   RDW 18.3 (H) 11.5 - 15.5 %   Platelets 371 150 - 400 K/uL   Neutrophils Relative % 84 %   Lymphocytes Relative 11 %   Monocytes Relative 5 %   Eosinophils Relative 0 %   Basophils Relative 0 %   Neutro Abs 6.0 1.7 - 7.7 K/uL   Lymphs Abs 0.8 0.7 - 4.0 K/uL   Monocytes Absolute 0.4 0.1 - 1.0 K/uL   Eosinophils Absolute 0.0 0.0 - 0.7 K/uL   Basophils Absolute 0.0 0.0 - 0.1 K/uL   RBC Morphology Schistocytes present     Comment: ELLIPTOCYTES Performed at [facility], [place] [address], [place], [place] [address]   Comprehensive metabolic panel     Status: Abnormal   Collection Time: 02/15/18  6:19 PM  Result Value Ref Range   Sodium 140 135 - 145 mmol/L   Potassium 3.4 (L) 3.5 - 5.1 mmol/L   Chloride 108 98 - 111 mmol/L    Comment: Please note  change in reference range.   CO2 21 (L) 22 - 32 mmol/L   Glucose, Bld 111 (H) 70 - 99 mg/dL    Comment: Please note change in reference range.   BUN 40 (H) 8 - 23 mg/dL    Comment: Please note change in reference range.   Creatinine, Ser 1.92 (H) 0.44 - 1.00 mg/dL   Calcium 9.4 8.9 - 10.3 mg/dL   Total Protein 6.3 (L) 6.5 - 8.1 g/dL   Albumin 2.6 (L) 3.5 - 5.0 g/dL   AST 13 (L) 15 - 41 U/L   ALT 13 0 - 44 U/L    Comment: Please note change in reference range.   Alkaline Phosphatase 119 38 - 126 U/L   Total Bilirubin 0.6 0.3 - 1.2 mg/dL   GFR calc non Af Amer 22 (L) >60 mL/min   GFR calc Af Amer 26 (L) >60 mL/min    Comment: (NOTE) The eGFR has been calculated using the CKD EPI equation. This calculation has not been validated in all clinical situations. eGFR's persistently <60 mL/min signify possible Chronic Kidney Disease.    Anion gap 11 5 - 15    Comment: Performed at [facility], [place] [address], [place], [place] [address]  Lipase, blood     Status: None   Collection Time: 02/15/18  6:19 PM  Result Value Ref Range   Lipase 20 11 - 51 U/L    Comment: Performed at [facility], [place] [address] [place], [place] [address]  I-Stat CG4 Lactic Acid, ED     Status: None   Collection Time: 02/15/18  6:26 PM  Result Value Ref Range   Lactic Acid, Venous 1.10 0.5 - 1.9 mmol/L  I-Stat CG4 Lactic Acid, ED     Status: None   Collection Time: 02/15/18 10:34 PM  Result Value Ref Range   Lactic Acid, Venous 1.16 0.5 - 1.9 mmol/L  CBC     Status: Abnormal   Collection Time: 02/15/18 11:33 PM  Result Value Ref Range   WBC 6.7 4.0 - 10.5 K/uL   RBC 3.12 (L) 3.87 - 5.11 MIL/uL  Hemoglobin 6.6 (LL) 12.0 - 15.0 g/dL    Comment: CRITICAL RESULT CALLED TO, READ BACK BY AND VERIFIED WITH: OXENDINE,J RN AT 1243 02/16/18 BY TIBBITTS,K    HCT 22.7 (L) 36.0 - 46.0 %   MCV 72.8 (L) 78.0 - 100.0 fL   MCH 21.2 (L) 26.0 - 34.0 pg   MCHC 29.1 (L) 30.0 - 36.0  g/dL   RDW 18.5 (H) 11.5 - 15.5 %   Platelets 307 150 - 400 K/uL    Comment: Performed at [facility], [place] [address] [place], [place] [address]  Type and screen [place]     Status: None   Collection Time: 02/15/18 11:45 PM  Result Value Ref Range   ABO/RH(D) O NEG    Antibody Screen NEG    Sample Expiration      02/18/2018 Performed at [facility], [place] [address] [place], [place] [address]   ABO/Rh     Status: None   Collection Time: 02/15/18 11:45 PM  Result Value Ref Range   ABO/RH(D)      Jenetta Downer NEG Performed at [place] [address] [place], [place] [address]    Ct Abdomen Pelvis Wo Contrast  Result Date: 02/15/2018 CLINICAL DATA:  84 year old female with history of colon cancer and colostomy presenting with obstruction. EXAM: CT ABDOMEN AND PELVIS WITHOUT CONTRAST TECHNIQUE: Multidetector CT imaging of the abdomen and pelvis was performed following the standard protocol without IV contrast. COMPARISON:  Abdominal radiograph dated 02/15/2018. FINDINGS: Evaluation of this exam is limited in the absence of intravenous contrast. Lower chest: The visualized lung bases are clear. There is hypoattenuation of the cardiac blood pool suggestive of a degree of anemia. Clinical correlation is recommended. No intra-abdominal free air or free fluid. Hepatobiliary: The liver is unremarkable on this noncontrast CT. No intrahepatic biliary ductal dilatation. There are multiple stones within the gallbladder with the largest stone measuring approximately 3 cm in length. Ultrasound may provide better evaluation of the gallbladder if clinically indicated. No pericholecystic fluid. Multiple large stones noted common bile duct. These stones measure up to 11 mm in diameter. Pancreas: Unremarkable. No pancreatic ductal dilatation or surrounding inflammatory changes. Spleen: Normal in size without focal abnormality.  Adrenals/Urinary Tract: The adrenal glands are unremarkable. Bilateral percutaneous nephrostomy tubes noted. The tip of the left nephrostomy tube is in the interpolar collecting system of the left kidney. There is mild fullness of the left renal collecting system and left ureter. There is no hydronephrosis on the right. Mechanical or Amplatzer device noted in the proximal left ureter, distal left ureter, as well as 2 additional devices in the distal right ureter. The urinary bladder is minimally distended and contains air-fluid level. This air is likely related to recent instrumentation, however an infectious process is not excluded. Correlation with urinalysis recommended. There is diffuse thickened and hazy appearance of the bladder wall with stranding of the perivesical fat. Stomach/Bowel: An enteric tube is partially visualized with tip in the body of the stomach. Postsurgical changes of partial colon resection with a left anterior colostomy. Contrast noted in the distal colon and rectum distal to the colostomy, likely related to rectally administered contrast. There is dilatation of fluid-filled loops of small bowel proximal to the colostomy measuring up to 5 cm in diameter. There is a probable transition in the right hemipelvis where there is abutment and tethering of loops of bowel likely representing adhesions (coronal series 4 image 58 and axial  series 2, image 55). Evaluation of this area and small bowel is limited due to non opacification with oral contrast. Vascular/Lymphatic: There is moderate aortoiliac atherosclerotic disease. The abdominal aorta and IVC otherwise grossly unremarkable on this noncontrast CT. No portal venous gas. No definite adenopathy. Reproductive: Hysterectomy. There is an ill-defined 3.1 x 4.9 x 5.0 cm complex collection containing low attenuating fluid/debris and air within the pelvis in the region of the hysterectomy concerning for an infected collection or abscess. This  collection may also represent a necrotic mass. There is apparent area of irregularity in the wall of the sigmoid colon (series 4, image 63 and axial series 2, image 52) which may represent an area of fistula between the sigmoid colon and this collection. Evaluation is very limited due to postsurgical changes and absence of contrast. There is diffuse infiltration of the pelvic floor fat and soft tissue infiltration of the posterior pelvis/perirectal space. Other: There is loss of subcutaneous fat and cachexia. There is a lipoma in the anterior left proximal thigh. Musculoskeletal: Osteopenia with degenerative changes of the spine and bilateral hips arthritis. No acute osseous pathology. IMPRESSION: 1. Small-bowel obstruction with transition within the pelvis likely secondary to adhesions. 2. Ill-defined complex collection within the pelvis anterior to the rectum and posterior to the bladder may represent an infected collection/abscess or a necrotic mass. There is irregularity of the wall of the sigmoid colon in this area which may represent a fistula tract extending into the collection. Evaluation is very limited due to postsurgical changes and absence of contrast. Clinical correlation is recommended. 3. Mild fullness of the left renal collecting system and ureter. Diffuse thickening of the bladder wall with air-fluid level content. This may be reactive or related to recent instrumentation or represent cystitis. Correlation with urinalysis recommended. 4. Cholelithiasis as well as multiple stones within the common bile duct. 5.  Aortic Atherosclerosis (ICD10-I70.0). Electronically Signed   By: Anner Crete M.D.   On: 02/15/2018 21:56   Dg Abdomen Acute W/chest  Result Date: 02/15/2018 CLINICAL DATA:  Abdominal distention, evaluate for obstruction EXAM: DG ABDOMEN ACUTE W/ 1V CHEST COMPARISON:  04/22/2016 abdominal CT FINDINGS: A nasogastric tube tip overlaps the proximal stomach. There are bilateral  percutaneous nephrostomy tubes. A nasogastric tube tip overlaps the proximal stomach. Dilated small bowel loops with fluid levels. No pneumoperitoneum. Bilateral ureteral occluders. Large lung volumes. There is no edema, consolidation, effusion, or pneumothorax. IMPRESSION: 1. Findings of small bowel obstruction. 2. Nasogastric tube in good position. Electronically Signed   By: Monte Fantasia M.D.   On: 02/15/2018 19:20    Pending Labs Unresulted Labs (From admission, onward)   Start     Ordered   02/16/18 1660  Basic metabolic panel  Tomorrow morning,   R     02/16/18 0015   02/16/18 0500  CBC  Tomorrow morning,   R     02/16/18 0015   02/16/18 0043  Ferritin  Once,   R     02/16/18 0042   02/16/18 0042  Iron and TIBC  Once,   R     02/16/18 0042   02/16/18 0012  CBC  (heparin)  Once,   R    Comments:  Baseline for heparin therapy IF NOT ALREADY DRAWN.  Notify MD if PLT < 100 K.    02/16/18 0015   02/16/18 0012  Creatinine, serum  (heparin)  Once,   R    Comments:  Baseline for heparin therapy IF NOT ALREADY DRAWN.  02/16/18 0015   02/15/18 2203  Blood culture (routine x 2)  BLOOD CULTURE X 2,   STAT     02/15/18 2202      Vitals/Pain Today's Vitals   02/15/18 2100 02/15/18 2130 02/15/18 2230 02/16/18 0000  BP: (!) 97/56 (!) 91/52 (!) 98/55 (!) 101/41  Pulse: 76 77 72 74  Resp:      Temp:      TempSrc:      SpO2: 99% 100% 100% 100%  Weight:      Height:        Isolation Precautions No active isolations  Medications Medications  0.9 % NaCl with KCl 20 mEq/ L  infusion (has no administration in time range)  piperacillin-tazobactam (ZOSYN) IVPB 2.25 g (has no administration in time range)  heparin injection 5,000 Units (has no administration in time range)  sodium chloride flush (NS) 0.9 % injection 3 mL (has no administration in time range)  HYDROmorphone (DILAUDID) injection 0.5 mg (has no administration in time range)  ondansetron (ZOFRAN) tablet 4 mg (has no  administration in time range)    Or  ondansetron (ZOFRAN) injection 4 mg (has no administration in time range)  LORazepam (ATIVAN) injection 2 mg (has no administration in time range)  famotidine (PEPCID) IVPB 20 mg premix (has no administration in time range)  sodium chloride 0.9 % bolus 1,000 mL (0 mLs Intravenous Stopped 02/15/18 2046)  ondansetron (ZOFRAN) injection 4 mg (4 mg Intravenous Given 02/15/18 1825)  morphine 4 MG/ML injection 4 mg (4 mg Intravenous Given 02/15/18 1826)  morphine 4 MG/ML injection 4 mg (4 mg Intravenous Given 02/15/18 2042)  piperacillin-tazobactam (ZOSYN) IVPB 3.375 g (0 g Intravenous Stopped 02/15/18 2333)  sodium chloride 0.9 % bolus 1,000 mL (0 mLs Intravenous Stopped 02/16/18 0034)    Mobility walks with person assist

## 2018-02-16 NOTE — Progress Notes (Signed)
[name] is a 84 y.o. female with medical history significant of endometrial cancer s/p hysterectomy, sigmoidal cancer s/p partial bowel resection, ileostomy and colostomy bad placement, nephrolithiasis s/p bilateral nephrostomy tube placement,  Hyponatremia, severe malnutrition, and insomnia, p/w worsening abdominal pain and distension since last week. CT abd pelvis w no contrast done on admission revealed SBO and suspected intra abdominal abscess vs necrotic mass. General surgery consulted and following. Highly appreciated.  02/16/18: Seen and examined at her bedside. NG tube to suction. Decreased stool output from colostomy bag. Pain is well controlled. Hg 6.1. 2U PRBCs to be transfused. Hold off chemical DVT ppx. AKI on CKD 3 on IV fluid. Hypokalemia 3.1, on IV Kcl replacement. Cholelithiasis and cholidocholithiasis, normal LFTs and normal bilirubin.  Please refer to H&P dictated by Dr Crisoforo Oxford on 02/16/18 for further details of the assessment and plan.

## 2018-02-16 NOTE — Progress Notes (Signed)
Initial Nutrition Assessment  DOCUMENTATION CODES:   Underweight, Severe malnutrition in context of chronic illness  INTERVENTION:   - Once diet advanced, Ensure Enlive po TID, each supplement provides 350 kcal and 20 grams of protein (pt prefers chocolate flavor)  NUTRITION DIAGNOSIS:   Severe Malnutrition related to chronic illness, poor appetite(history of endometrial and sigmoidal cancer, colostomy) as evidenced by severe fat depletion, severe muscle depletion, moderate muscle depletion.  GOAL:   Patient will meet greater than or equal to 90% of their needs  MONITOR:   Diet advancement, Weight trends, I & O's, Labs, Skin  REASON FOR ASSESSMENT:   Malnutrition Screening Tool    ASSESSMENT:   84 year old female who presented to ED with colostomy blockage. PMH significant for multiple intra-abdominal surgeries and colon perforation, anal fistula, anemia, depression, nephrolithiasis s/p bilateral nephrostomy tube placement, endometrial cancer s/p hysterectomy, and sigmoidal cancer s/p partial bowel resection.  Spoke with pt at bedside who reports feeling better today. Pt with NG tube to low intermittent suction.  Pt states that her appetite is "terrible" and that her appetite has decreased "gradually." Pt reports that food no longer tastes good. Pt states she will make one of her favorite meals but does not want to eat it when it is ready.  Pt reports frustration with not being able to eat foods she likes and that are "healthy" likes beans, sweet potatoes, and corn. Pt states that she has "problems" with these foods. Pt endorses eating eggs. RD encouraged continued protein intake. Pt noted that she does not eat as fast as those around her.  Pt states that she has "1 decent meal" each day and then "picks" at food during other mealtimes. Pt reports a typical meal may include a sandwich with deli meat, lettuce, tomato, and mayo. Pt reports snacking on cheese and crackers. Pt is  interested in drinking Ensure oral nutrition supplements after discharge and is agreeable to receiving Ensure Enlive once diet advanced.  Pt endorses weight loss over the past 6-8 months. Pt reports her UBW as 100-101 lbs. Weight history in chart since April 2018 is limited to stated weights rather than measured weights so unsure of accuracy. RD obtained bed weight: 40.0 kg (88 lbs). RD recorded measured weight in pt's chart.  Per surgery note, pt to start small bowel protocol today.  Medications reviewed and include: IV KCl, IV Pepcid, 2 grams IV magnesium sulfate once  Labs reviewed: potassium 3.1 (L), chloride 113 (H), CO2 20 (L), BN 37 (H), creatinine 1.69 (H), hemoglobin 6.1 (L), HCT 21.3 (L)  NG output: 200 ml since admission  NUTRITION - FOCUSED PHYSICAL EXAM:    Most Recent Value  Orbital Region  Severe depletion  Upper Arm Region  Severe depletion  Thoracic and Lumbar Region  Severe depletion  Buccal Region  Severe depletion  Temple Region  Severe depletion  Clavicle Bone Region  Severe depletion  Clavicle and Acromion Bone Region  Severe depletion  Scapular Bone Region  Severe depletion  Dorsal Hand  Moderate depletion  Patellar Region  Severe depletion  Anterior Thigh Region  Severe depletion  Posterior Calf Region  Severe depletion  Edema (RD Assessment)  None  Hair  Reviewed  Eyes  Reviewed  Mouth  Reviewed  Skin  Reviewed  Nails  Reviewed       Diet Order:   Diet Order           Diet NPO time specified  Diet effective now  EDUCATION NEEDS:   No education needs have been identified at this time  Skin:  Skin Assessment: Skin Integrity Issues: Skin Integrity Issues:: Stage II Stage II: sacrum  Last BM:  02/15/18 colostomy LLQ  Height:   Ht Readings from Last 1 Encounters:  02/15/18 5\' 5"  (1.651 m)    Weight:   Wt Readings from Last 1 Encounters:  02/16/18 88 lb 2.9 oz (40 kg)    Ideal Body Weight:  56.82 kg  BMI:  Body mass index  is 14.67 kg/m.  Estimated Nutritional Needs:   Kcal:  1400-1600 kcal/day  Protein:  65-80 grams/day  Fluid:  1.4-1.6 L/day    Gaynell Face, MS, RD, LDN Pager: [phone] Weekend/After Hours: [phone]

## 2018-02-16 NOTE — Consult Note (Addendum)
[facility] Surgery Consult Note  [place] [birth year]  [id number].    Requesting MD: Crisoforo Oxford Chief Complaint/Reason for Consult: SBO HPI:  Patient is an 84 year old female who presented to [facility] with abdominal pain and distention that has worsened over the last week. Reports decreased output from colostomy, distention, abdominal cramping and nausea with decreased appetite. Has not had a bowel obstruction ever before. Denies flatus. She denies black or bloody stool output. She does report beige foul smelling drainage from both vagina and rectum that is more chronic. Denies fevers, chills, SOB, chest pain. NGT was placed overnight and since this she feels less distended and has less pain.   Past abdominal surgeries: low anterior colon resection 1989 Dr. Margot Chimes, ileostomy 2004, ileostomy revision 2004, ileostomy takedown 2006, colostomy 2007, abdominal hysterectomy   ROS: Review of Systems  Constitutional: Negative for chills and fever.  Respiratory: Negative for shortness of breath and wheezing.   Cardiovascular: Negative for chest pain and palpitations.  Gastrointestinal: Positive for abdominal pain and nausea. Negative for blood in stool, constipation, diarrhea, melena and vomiting.  Genitourinary: Negative for dysuria.       Patient reports beige foul-smelling drainage from both vagina and rectum  All other systems reviewed and are negative.   Family History  Problem Relation Age of Onset  . Heart disease Father   . Hypertension Other   . Kidney disease Other     Past Medical History:  Diagnosis Date  . Anal fistula   . Anemia   . Arthritis   . Colon perforation ([place])   . Colon polyp   . Colostomy in place [facility])   . Depression   . Endometrial cancer ([place])   . Fistula    vesicocolonic  . History of colon cancer   . History of nephrolithiasis   . LBP (low back pain)   . Malnutrition ([place])   . Nephrostomy status ([place])    replaced every 6-8 weeks  . Osteoporosis     Pt declined Rx  . 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 Small bowel obstruction ([place])   . Ureteral stenosis   . Vitamin B12 deficiency   . Vitamin D deficiency     Past Surgical History:  Procedure Laterality Date  . ABCESS DRAINAGE    . ABDOMINAL HYSTERECTOMY    . COLOSTOMY  07/12/2006   for colovesicle fistula - Dr Margot Chimes  . COLOSTOMY TAKEDOWN  07/17/2006   Dr Margot Chimes  . COLOSTOMY TAKEDOWN  01/13/2005   closed iliostomy -Dr Margot Chimes  . CYSTOSCOPY WITH LITHOLAPAXY N/A 04/17/2014   Procedure: CYSTOSCOPY WITH LITHOLAPAXY;  Surgeon: Jorja Loa, MD;  Location: [facility] ORS;  Service: Urology;  Laterality: N/A;  . iliostomy  2004   Done due to perf cecum at colonsocpy sone in [place]  . iliostomy revision  09/18/2002   for stenosis - Dr Margot Chimes  . LOW ANTERIOR BOWEL RESECTION  07/09/1988   For sigmoid cancer - Dr Margot Chimes  . NEPHROSTOMY  2008   bilateral- Dr. Jules Schick  . TONSILLECTOMY    . ureteral blockage      04/05/14   . URINARY DIVERSION      Social History:  reports that she has been smoking cigarettes.  She has been smoking about 0.25 packs per day. She has never used smokeless tobacco. She reports that she drinks alcohol. She reports that she does not use drugs.  Allergies:  Allergies  Allergen Reactions  . Ciprofloxacin Other (See  Comments)    Stomach   . Flagyl [Metronidazole Hcl]     nausea  . Gabapentin     unknown  . Raloxifene     unknown  . Venlafaxine     Medications Prior to Admission  Medication Sig Dispense Refill  . Cholecalciferol (VITAMIN D3) 1000 UNITS tablet Take 1,000-2,000 Units by mouth 2 (two) times daily. 1,000 units at lunch and 2,000 units at dinner    . Cyanocobalamin (VITAMIN B-12 PO) Place 3,000 mcg under the tongue daily.    . furosemide (LASIX) 20 MG tablet TAKE 1 TABLET (20 MG TOTAL) BY MOUTH DAILY AS NEEDED. FOR SWELLING 90 tablet 0  . LUTEIN PO Take by mouth.    . mirtazapine (REMERON) 15 MG tablet Take 1 tablet (15 mg total)  by mouth at bedtime. 90 tablet 3  . oxyCODONE-acetaminophen (PERCOCET) 10-325 MG tablet Take 1 tablet by mouth every 6 (six) hours as needed for pain. 120 tablet 0  . polyethylene glycol powder (GLYCOLAX/MIRALAX) powder Take 255 g by mouth once. 255 g 5  . Probiotic Product (ALIGN) 4 MG CAPS Take 4 mg by mouth daily. For intestinal flora restoration    . temazepam (RESTORIL) 30 MG capsule TAKE 1 CAPSULE BY MOUTH AT BEDTIME AS NEEDED FOR SLEEP 90 capsule 1    Blood pressure (!) 96/44, pulse 73, temperature 98 F (36.7 C), temperature source Oral, resp. rate 20, height '5\' 5"'$  (1.651 m), weight 41.7 kg (92 lb), SpO2 99 %. Physical Exam: Physical Exam  Constitutional: She is oriented to person, place, and time. She appears well-developed. She appears cachectic. She is cooperative.  Non-toxic appearance. No distress.  HENT:  Head: Normocephalic and atraumatic.  Right Ear: External ear normal.  Left Ear: External ear normal.  Nose: Nose normal.  Mouth/Throat: Oropharynx is clear and moist.  Eyes: Pupils are equal, round, and reactive to light. Conjunctivae, EOM and lids are normal. No scleral icterus.  Neck: Normal range of motion and phonation normal. Neck supple.  Cardiovascular: Normal rate and regular rhythm.  Pulses:      Radial pulses are 2+ on the right side, and 2+ on the left side.       Dorsalis pedis pulses are 2+ on the right side, and 2+ on the left side.  Pulmonary/Chest: Effort normal and breath sounds normal.  Abdominal: Soft. She exhibits distension (mild). Bowel sounds are decreased. There is no hepatosplenomegaly. There is no tenderness. There is no rigidity, no rebound and no guarding. No hernia.  Colostomy present in LLQ with liquid stool in bag  Musculoskeletal:  ROM grossly intact in bilateral upper and lower extremities. No gross deformities of bilateral upper and lower extremities  Neurological: She is alert and oriented to person, place, and time. No sensory deficit.   Skin: Skin is warm, dry and intact.  Psychiatric: She has a normal mood and affect. Her speech is normal and behavior is normal.    Results for orders placed or performed during the hospital encounter of 02/15/18 (from the past 48 hour(s))  CBC with Differential     Status: Abnormal   Collection Time: 02/15/18  6:19 PM  Result Value Ref Range   WBC 7.2 4.0 - 10.5 K/uL   RBC 3.78 (L) 3.87 - 5.11 MIL/uL   Hemoglobin 7.9 (L) 12.0 - 15.0 g/dL   HCT 27.5 (L) 36.0 - 46.0 %   MCV 72.8 (L) 78.0 - 100.0 fL   MCH 20.9 (L) 26.0 - 34.0 pg  MCHC 28.7 (L) 30.0 - 36.0 g/dL   RDW 18.3 (H) 11.5 - 15.5 %   Platelets 371 150 - 400 K/uL   Neutrophils Relative % 84 %   Lymphocytes Relative 11 %   Monocytes Relative 5 %   Eosinophils Relative 0 %   Basophils Relative 0 %   Neutro Abs 6.0 1.7 - 7.7 K/uL   Lymphs Abs 0.8 0.7 - 4.0 K/uL   Monocytes Absolute 0.4 0.1 - 1.0 K/uL   Eosinophils Absolute 0.0 0.0 - 0.7 K/uL   Basophils Absolute 0.0 0.0 - 0.1 K/uL   RBC Morphology Schistocytes present     Comment: ELLIPTOCYTES Performed at [facility] [address], [place], [place] [address]   Comprehensive metabolic panel     Status: Abnormal   Collection Time: 02/15/18  6:19 PM  Result Value Ref Range   Sodium 140 135 - 145 mmol/L   Potassium 3.4 (L) 3.5 - 5.1 mmol/L   Chloride 108 98 - 111 mmol/L    Comment: Please note change in reference range.   CO2 21 (L) 22 - 32 mmol/L   Glucose, Bld 111 (H) 70 - 99 mg/dL    Comment: Please note change in reference range.   BUN 40 (H) 8 - 23 mg/dL    Comment: Please note change in reference range.   Creatinine, Ser 1.92 (H) 0.44 - 1.00 mg/dL   Calcium 9.4 8.9 - 10.3 mg/dL   Total Protein 6.3 (L) 6.5 - 8.1 g/dL   Albumin 2.6 (L) 3.5 - 5.0 g/dL   AST 13 (L) 15 - 41 U/L   ALT 13 0 - 44 U/L    Comment: Please note change in reference range.   Alkaline Phosphatase 119 38 - 126 U/L   Total Bilirubin 0.6 0.3 - 1.2 mg/dL   GFR calc non Af  Amer 22 (L) >60 mL/min   GFR calc Af Amer 26 (L) >60 mL/min    Comment: (NOTE) The eGFR has been calculated using the CKD EPI equation. This calculation has not been validated in all clinical situations. eGFR's persistently <60 mL/min signify possible Chronic Kidney Disease.    Anion gap 11 5 - 15    Comment: Performed at [facility], [place] [address] [place], [place] [address]  Lipase, blood     Status: None   Collection Time: 02/15/18  6:19 PM  Result Value Ref Range   Lipase 20 11 - 51 U/L    Comment: Performed at [facility], [place] [address] [place],  [address]  I-Stat CG4 Lactic Acid, ED     Status: None   Collection Time: 02/15/18  6:26 PM  Result Value Ref Range   Lactic Acid, Venous 1.10 0.5 - 1.9 mmol/L  I-Stat CG4 Lactic Acid, ED     Status: None   Collection Time: 02/15/18 10:34 PM  Result Value Ref Range   Lactic Acid, Venous 1.16 0.5 - 1.9 mmol/L  CBC     Status: Abnormal   Collection Time: 02/15/18 11:33 PM  Result Value Ref Range   WBC 6.7 4.0 - 10.5 K/uL   RBC 3.12 (L) 3.87 - 5.11 MIL/uL   Hemoglobin 6.6 (LL) 12.0 - 15.0 g/dL    Comment: CRITICAL RESULT CALLED TO, READ BACK BY AND VERIFIED WITH: OXENDINE,J RN AT 1243 02/16/18 BY TIBBITTS,K    HCT 22.7 (L) 36.0 - 46.0 %   MCV 72.8 (L) 78.0 - 100.0 fL  MCH 21.2 (L) 26.0 - 34.0 pg   MCHC 29.1 (L) 30.0 - 36.0 g/dL   RDW 18.5 (H) 11.5 - 15.5 %   Platelets 307 150 - 400 K/uL    Comment: Performed at [facility], [place] [address], [place], [place] [address]  Type and screen [place]     Status: None (Preliminary result)   Collection Time: 02/15/18 11:45 PM  Result Value Ref Range   ABO/RH(D) O NEG    Antibody Screen NEG    Sample Expiration      02/18/2018 Performed at [facility], [place] [address] [place], [place] [address]    Unit Number [id number]    Blood Component Type RED CELLS,LR    Unit  division 00    Status of Unit ALLOCATED    Transfusion Status OK TO TRANSFUSE    Crossmatch Result Compatible    Unit Number [id number]    Blood Component Type RED CELLS,LR    Unit division 00    Status of Unit ALLOCATED    Transfusion Status OK TO TRANSFUSE    Crossmatch Result Compatible   ABO/Rh     Status: None   Collection Time: 02/15/18 11:45 PM  Result Value Ref Range   ABO/RH(D)      Jenetta Downer NEG Performed at [facility], [place] [address] [place], [place] [address]   Basic metabolic panel     Status: Abnormal   Collection Time: 02/16/18  4:54 AM  Result Value Ref Range   Sodium 144 135 - 145 mmol/L   Potassium 3.1 (L) 3.5 - 5.1 mmol/L   Chloride 113 (H) 98 - 111 mmol/L    Comment: Please note change in reference range.   CO2 20 (L) 22 - 32 mmol/L   Glucose, Bld 71 70 - 99 mg/dL    Comment: Please note change in reference range.   BUN 37 (H) 8 - 23 mg/dL    Comment: Please note change in reference range.   Creatinine, Ser 1.69 (H) 0.44 - 1.00 mg/dL   Calcium 8.5 (L) 8.9 - 10.3 mg/dL   GFR calc non Af Amer 26 (L) >60 mL/min   GFR calc Af Amer 30 (L) >60 mL/min    Comment: (NOTE) The eGFR has been calculated using the CKD EPI equation. This calculation has not been validated in all clinical situations. eGFR's persistently <60 mL/min signify possible Chronic Kidney Disease.    Anion gap 11 5 - 15    Comment: Performed at [facility], [place] [address] [place],  [address]  CBC     Status: Abnormal   Collection Time: 02/16/18  4:54 AM  Result Value Ref Range   WBC 6.0 4.0 - 10.5 K/uL   RBC 2.91 (L) 3.87 - 5.11 MIL/uL   Hemoglobin 6.1 (LL) 12.0 - 15.0 g/dL    Comment: CRITICAL VALUE NOTED.  VALUE IS CONSISTENT WITH PREVIOUSLY REPORTED AND CALLED VALUE.   HCT 21.3 (L) 36.0 - 46.0 %   MCV 73.2 (L) 78.0 - 100.0 fL   MCH 21.0 (L) 26.0 - 34.0 pg   MCHC 28.6 (L) 30.0 - 36.0 g/dL   RDW 18.5 (H) 11.5 - 15.5 %   Platelets 298 150 -  400 K/uL    Comment: Performed at [facility], [place] [address], [place], [place] [address]  Iron and TIBC     Status: Abnormal   Collection Time: 02/16/18  4:54 AM  Result Value Ref Range   Iron 8 (L) 28 - 170 ug/dL   TIBC 184 (L) 250 - 450 ug/dL   Saturation Ratios 4 (L) 10.4 - 31.8 %   UIBC 176 ug/dL    Comment: Performed at [facility], [place] [address], [place], [place] [address]  Ferritin     Status: None   Collection Time: 02/16/18  4:54 AM  Result Value Ref Range   Ferritin 19 11 - 307 ng/mL    Comment: Performed at [facility], [place] [address] [place], [place] [address]  Prepare RBC     Status: None   Collection Time: 02/16/18  8:08 AM  Result Value Ref Range   Order Confirmation      ORDER PROCESSED BY BLOOD BANK Performed at [facility], [place] [address], [place], [place] [address]    Ct Abdomen Pelvis Wo Contrast  Result Date: 02/15/2018 CLINICAL DATA:  84 year old female with history of colon cancer and colostomy presenting with obstruction. EXAM: CT ABDOMEN AND PELVIS WITHOUT CONTRAST TECHNIQUE: Multidetector CT imaging of the abdomen and pelvis was performed following the standard protocol without IV contrast. COMPARISON:  Abdominal radiograph dated 02/15/2018. FINDINGS: Evaluation of this exam is limited in the absence of intravenous contrast. Lower chest: The visualized lung bases are clear. There is hypoattenuation of the cardiac blood pool suggestive of a degree of anemia. Clinical correlation is recommended. No intra-abdominal free air or free fluid. Hepatobiliary: The liver is unremarkable on this noncontrast CT. No intrahepatic biliary ductal dilatation. There are multiple stones within the gallbladder with the largest stone measuring approximately 3 cm in length. Ultrasound may provide better evaluation of the gallbladder if clinically indicated. No pericholecystic fluid. Multiple large stones  noted common bile duct. These stones measure up to 11 mm in diameter. Pancreas: Unremarkable. No pancreatic ductal dilatation or surrounding inflammatory changes. Spleen: Normal in size without focal abnormality. Adrenals/Urinary Tract: The adrenal glands are unremarkable. Bilateral percutaneous nephrostomy tubes noted. The tip of the left nephrostomy tube is in the interpolar collecting system of the left kidney. There is mild fullness of the left renal collecting system and left ureter. There is no hydronephrosis on the right. Mechanical or Amplatzer device noted in the proximal left ureter, distal left ureter, as well as 2 additional devices in the distal right ureter. The urinary bladder is minimally distended and contains air-fluid level. This air is likely related to recent instrumentation, however an infectious process is not excluded. Correlation with urinalysis recommended. There is diffuse thickened and hazy appearance of the bladder wall with stranding of the perivesical fat. Stomach/Bowel: An enteric tube is partially visualized with tip in the body of the stomach. Postsurgical changes of partial colon resection with a left anterior colostomy. Contrast noted in the distal colon and rectum distal to the colostomy, likely related to rectally administered contrast. There is dilatation of fluid-filled loops of small bowel proximal to the colostomy measuring up to 5 cm in diameter. There is a probable transition in the right hemipelvis where there is abutment and tethering of loops of bowel likely representing adhesions (coronal series 4 image 58 and axial series 2, image 55). Evaluation of this area and small bowel is limited due to non opacification with oral contrast. Vascular/Lymphatic: There is moderate aortoiliac atherosclerotic disease. The abdominal aorta and IVC otherwise grossly unremarkable on this noncontrast CT. No portal venous gas. No definite adenopathy. Reproductive: Hysterectomy. There is an  ill-defined 3.1 x 4.9  x 5.0 cm complex collection containing low attenuating fluid/debris and air within the pelvis in the region of the hysterectomy concerning for an infected collection or abscess. This collection may also represent a necrotic mass. There is apparent area of irregularity in the wall of the sigmoid colon (series 4, image 63 and axial series 2, image 52) which may represent an area of fistula between the sigmoid colon and this collection. Evaluation is very limited due to postsurgical changes and absence of contrast. There is diffuse infiltration of the pelvic floor fat and soft tissue infiltration of the posterior pelvis/perirectal space. Other: There is loss of subcutaneous fat and cachexia. There is a lipoma in the anterior left proximal thigh. Musculoskeletal: Osteopenia with degenerative changes of the spine and bilateral hips arthritis. No acute osseous pathology. IMPRESSION: 1. Small-bowel obstruction with transition within the pelvis likely secondary to adhesions. 2. Ill-defined complex collection within the pelvis anterior to the rectum and posterior to the bladder may represent an infected collection/abscess or a necrotic mass. There is irregularity of the wall of the sigmoid colon in this area which may represent a fistula tract extending into the collection. Evaluation is very limited due to postsurgical changes and absence of contrast. Clinical correlation is recommended. 3. Mild fullness of the left renal collecting system and ureter. Diffuse thickening of the bladder wall with air-fluid level content. This may be reactive or related to recent instrumentation or represent cystitis. Correlation with urinalysis recommended. 4. Cholelithiasis as well as multiple stones within the common bile duct. 5.  Aortic Atherosclerosis (ICD10-I70.0). Electronically Signed   By: Anner Crete M.D.   On: 02/15/2018 21:56   Dg Abdomen Acute W/chest  Result Date: 02/15/2018 CLINICAL DATA:   Abdominal distention, evaluate for obstruction EXAM: DG ABDOMEN ACUTE W/ 1V CHEST COMPARISON:  04/22/2016 abdominal CT FINDINGS: A nasogastric tube tip overlaps the proximal stomach. There are bilateral percutaneous nephrostomy tubes. A nasogastric tube tip overlaps the proximal stomach. Dilated small bowel loops with fluid levels. No pneumoperitoneum. Bilateral ureteral occluders. Large lung volumes. There is no edema, consolidation, effusion, or pneumothorax. IMPRESSION: 1. Findings of small bowel obstruction. 2. Nasogastric tube in good position. Electronically Signed   By: Monte Fantasia M.D.   On: 02/15/2018 19:20      Assessment/Plan Hx of colon cancer Hx of endometrial cancer Radiation fibrosis Anemia of chronic disease - H/H 6.1/21.3 this AM, transfusion per primary service Malnutrition - will consult dietary  Ureteral stenosis and hx of nephrolithiasis - b/l nephrostomy tubes present Depression  Choledocholithiasis - GI consult  SBO - multiple past abdominal surgeries, likely due to adhesive disease - CT 7/11: sbo with transition within the pelvis - patient having stool output from colostomy but reports this as decreased - 280 cc out overnight - NGT with bilious output - start small bowel protocol today Intra-abdominal abscess vs necrotic mass - CT 7/11: ill-defined collection within pelvis, anterior to rectum and posterior to bladder. Possible fistulous tract extending to collection  - WBC 6.0 and patient afebrile - concerned that this may represent some sort of malignancy or given patient's rectal/vaginal drainage some sort of fistulous collection - will discuss further with MD Hypokalemia - K 3.1, replace IV   FEN: NPO, IVF; NGT to LIWS VTE: SCDs ID: [id number] 7/11>>  Brigid Re, [facility] 02/16/2018, 9:42 AM Pager: [phone] Consults: [phone] Mon-Fri 7:00 am-4:30 pm Sat-Sun 7:00 am-11:30 am

## 2018-02-17 ENCOUNTER — Inpatient Hospital Stay (HOSPITAL_COMMUNITY): Payer: Medicare HMO

## 2018-02-17 DIAGNOSIS — K8050 Calculus of bile duct without cholangitis or cholecystitis without obstruction: Secondary | ICD-10-CM

## 2018-02-17 DIAGNOSIS — R935 Abnormal findings on diagnostic imaging of other abdominal regions, including retroperitoneum: Secondary | ICD-10-CM

## 2018-02-17 DIAGNOSIS — C187 Malignant neoplasm of sigmoid colon: Secondary | ICD-10-CM

## 2018-02-17 DIAGNOSIS — K603 Anal fistula: Secondary | ICD-10-CM

## 2018-02-17 DIAGNOSIS — N183 Chronic kidney disease, stage 3 (moderate): Secondary | ICD-10-CM

## 2018-02-17 LAB — BPAM RBC
BLOOD PRODUCT EXPIRATION DATE: 201908072359
BLOOD PRODUCT EXPIRATION DATE: 201908072359
ISSUE DATE / TIME: 201907121218
ISSUE DATE / TIME: 201907121543
UNIT TYPE AND RH: 9500
Unit Type and Rh: 9500

## 2018-02-17 LAB — COMPREHENSIVE METABOLIC PANEL
ALK PHOS: 77 U/L (ref 38–126)
ALT: 12 U/L (ref 0–44)
AST: 16 U/L (ref 15–41)
Albumin: 2 g/dL — ABNORMAL LOW (ref 3.5–5.0)
Anion gap: 11 (ref 5–15)
BILIRUBIN TOTAL: 1.6 mg/dL — AB (ref 0.3–1.2)
BUN: 36 mg/dL — AB (ref 8–23)
CALCIUM: 9.4 mg/dL (ref 8.9–10.3)
CHLORIDE: 118 mmol/L — AB (ref 98–111)
CO2: 19 mmol/L — ABNORMAL LOW (ref 22–32)
CREATININE: 1.95 mg/dL — AB (ref 0.44–1.00)
GFR, EST AFRICAN AMERICAN: 25 mL/min — AB (ref >60)
GFR, EST NON AFRICAN AMERICAN: 22 mL/min — AB (ref >60)
Glucose, Bld: 68 mg/dL — ABNORMAL LOW (ref 70–99)
Potassium: 5 mmol/L (ref 3.5–5.1)
Sodium: 148 mmol/L — ABNORMAL HIGH (ref 135–145)
TOTAL PROTEIN: 4.9 g/dL — AB (ref 6.5–8.1)

## 2018-02-17 LAB — TYPE AND SCREEN
ABO/RH(D): O NEG
Antibody Screen: NEGATIVE
UNIT DIVISION: 0
UNIT DIVISION: 0

## 2018-02-17 LAB — CBC
HCT: 30.9 % — ABNORMAL LOW (ref 36.0–46.0)
Hemoglobin: 9.4 g/dL — ABNORMAL LOW (ref 12.0–15.0)
MCH: 24.2 pg — ABNORMAL LOW (ref 26.0–34.0)
MCHC: 30.4 g/dL (ref 30.0–36.0)
MCV: 79.6 fL (ref 78.0–100.0)
PLATELETS: 201 10*3/uL (ref 150–400)
RBC: 3.88 MIL/uL (ref 3.87–5.11)
RDW: 20.5 % — AB (ref 11.5–15.5)
WBC: 9.6 10*3/uL (ref 4.0–10.5)

## 2018-02-17 LAB — MAGNESIUM: MAGNESIUM: 2.1 mg/dL (ref 1.7–2.4)

## 2018-02-17 MED ORDER — DEXTROSE IN LACTATED RINGERS 5 % IV SOLN
INTRAVENOUS | Status: DC
  Administered 2018-02-17 – 2018-02-19 (×2): via INTRAVENOUS

## 2018-02-17 MED ORDER — FERROUS SULFATE 220 (44 FE) MG/5ML PO ELIX: 220.0000 mg | ORAL_SOLUTION | Freq: Two times a day (BID) | ORAL | Status: DC

## 2018-02-17 MED ORDER — HEPARIN SODIUM (PORCINE) 5000 UNIT/ML IJ SOLN
5000.0000 [IU] | Freq: Three times a day (TID) | INTRAMUSCULAR | Status: DC
  Administered 2018-02-17 – 2018-02-22 (×14): 5000 [IU] via SUBCUTANEOUS
  Filled 2018-02-17 (×14): qty 1

## 2018-02-17 NOTE — Progress Notes (Addendum)
PROGRESS NOTE  [name] VOH:[id number] DOB: 1928/02/20 DOA: 02/15/2018 PCP: Cassandria Anger, MD  HPI/Recap of past 24 hours: [name] a 84 y.o.femalewith medical history significant ofendometrial cancer s/p hysterectomy, sigmoidal cancer s/p partial bowel resection, ileostomy and colostomy bad placement, nephrolithiasis s/p bilateral nephrostomy tube placement, Hyponatremia, severe malnutrition, and insomnia, p/w worsening abdominal pain and distension since last week. CT abd pelvis w no contrast done on admission revealed SBO and suspected intra abdominal abscess vs necrotic mass. General surgery consulted and following. Highly appreciated.  02/16/18: Seen and examined at her bedside. NG tube to suction. Decreased stool output from colostomy bag. Pain is well controlled. Hg 6.1. 2U PRBCs to be transfused. Hold off chemical DVT ppx. AKI on CKD 3 on IV fluid. Hypokalemia 3.1, on IV Kcl replacement. Cholelithiasis and cholidocholithiasis, normal LFTs and normal bilirubin.  02/17/2018: Patient seen and examined at bedside.  She reports her abdominal pain is improved.  Less output from her NG tube and more output from her colostomy bag.  Also reports improvement of her nausea.    Assessment/Plan: Principal Problem:   SBO (small bowel obstruction) (HCC) Active Problems:   CANCER, ENDOMETRIUM   FISTULA, ANAL   Abdominal pain, generalized   Insomnia   Colostomy in place (HCC)   Nausea   Abdominal distension   Abnormal CT of the abdomen   Hypokalemia   Hypoalbuminemia   Severe protein-calorie malnutrition (HCC)   Microcytic anemia   Acute renal failure superimposed on stage 3 chronic kidney disease (HCC)   Hypotension  Small bowel obstruction, suspect secondary to adhesions from multiple abdominal surgeries General surgery following NG tube to suction No plan for surgery at this time Ambulate as tolerated Repeat abdominal x-ray pending  Intra-abdominal  abscess versus necrotic mass  Noted on CT abdomen and pelvis with no contrast done on admission General surgery following Continue IV antibiotics Continue to monitor fever curve Continue to monitor WBC  AKI on CKD 4 Worsening Creatinine 1.95 today from 1.6 Appears baseline is about 1.6 Avoid nephrotoxic agents/dehydration/hypotension/ Continue IV fluid hydration Monitor urine output  Iatrogenic hypernatremia Sodium 148 Stop IV normal saline Start LR D5W Repeat BMP in the morning  Hyperkalemia Potassium 5.0 Stop potassium supplementation Continue LR D5W as stated above Repeat BMP in the morning  Chronic microcytic anemia/iron deficiency anemia Status post 2 units PRBC transfusion Hemoglobin 6.1 on presentation Transfused 2 units PRBC on 02/16/2018 Hemoglobin today 9.4 MCV 79 FOBT negative When safe to swallow or at discharge, start ferrous sulfate 325 mg BID Repeat CBC in the morning  Cholelithiasis/choledocholithiasis No sign of obstructive jaundice or transaminitis GI will follow outpatient    Code Status: Full code  Family Communication: None at bedside  Disposition Plan: Home possibly in 2 to 3 days   Consultants:  General surgery  Procedures:  None  Antimicrobials:  IV Zosyn  DVT prophylaxis: Subcu heparin 3 times daily   Objective: Vitals:   02/16/18 1915 02/16/18 2106 02/17/18 0421 02/17/18 1317  BP: (!) 136/51 (!) 106/51 (!) 98/46 (!) 101/45  Pulse: 75 94 (!) 57 (!) 48  Resp: 16 16 18 18   Temp: 97.7 F (36.5 C) 99.1 F (37.3 C) 98.9 F (37.2 C) 98.4 F (36.9 C)  TempSrc: Oral Axillary Oral Oral  SpO2: 94% 94% 97% 98%  Weight:      Height:        Intake/Output Summary (Last 24 hours) at 02/17/2018 1336 Last data filed at 02/17/2018 [phone]  Gross per 24 hour  Intake 2889.75 ml  Output 2950 ml  Net -60.25 ml   Filed Weights   02/15/18 1740 02/16/18 0942  Weight: 41.7 kg (92 lb) 40 kg (88 lb 2.9 oz)    Exam:  . General: 84  y.o. year-old female well developed well nourished in no acute distress.  Alert and oriented x3.  NG tube in place to suction. . Cardiovascular: Regular rate and rhythm with no rubs or gallops.  No thyromegaly or JVD noted.   Marland Kitchen Respiratory: Clear to auscultation with no wheezes or rales. Good inspiratory effort. . Abdomen: Soft nontender nondistended with hypoactive bowel sounds.  Colostomy bag in place. . Musculoskeletal: No lower extremity edema. 2/4 pulses in all 4 extremities. . Skin: No ulcerative lesions noted or rashes, . Psychiatry: Mood is appropriate for condition and setting   Data Reviewed: CBC: Recent Labs  Lab 02/15/18 1819 02/15/18 2333 02/16/18 0454 02/16/18 2056 02/17/18 0433  WBC 7.2 6.7 6.0  --  9.6  NEUTROABS 6.0  --   --   --   --   HGB 7.9* 6.6* 6.1* 10.8* 9.4*  HCT 27.5* 22.7* 21.3* 35.5* 30.9*  MCV 72.8* 72.8* 73.2*  --  79.6  PLT 371 307 298  --  144   Basic Metabolic Panel: Recent Labs  Lab 02/15/18 1819 02/16/18 0454 02/17/18 0433  NA 140 144 148*  K 3.4* 3.1* 5.0  CL 108 113* 118*  CO2 21* 20* 19*  GLUCOSE 111* 71 68*  BUN 40* 37* 36*  CREATININE 1.92* 1.69* 1.95*  CALCIUM 9.4 8.5* 9.4  MG  --   --  2.1   GFR: Estimated Creatinine Clearance: 12.6 mL/min (A) (by C-G formula based on SCr of 1.95 mg/dL (H)). Liver Function Tests: Recent Labs  Lab 02/15/18 1819 02/17/18 0433  AST 13* 16  ALT 13 12  ALKPHOS 119 77  BILITOT 0.6 1.6*  PROT 6.3* 4.9*  ALBUMIN 2.6* 2.0*   Recent Labs  Lab 02/15/18 1819  LIPASE 20   No results for input(s): AMMONIA in the last 168 hours. Coagulation Profile: No results for input(s): INR, PROTIME in the last 168 hours. Cardiac Enzymes: No results for input(s): CKTOTAL, CKMB, CKMBINDEX, TROPONINI in the last 168 hours. BNP (last 3 results) No results for input(s): PROBNP in the last 8760 hours. HbA1C: No results for input(s): HGBA1C in the last 72 hours. CBG: No results for input(s): GLUCAP in the  last 168 hours. Lipid Profile: No results for input(s): CHOL, HDL, LDLCALC, TRIG, CHOLHDL, LDLDIRECT in the last 72 hours. Thyroid Function Tests: No results for input(s): TSH, T4TOTAL, FREET4, T3FREE, THYROIDAB in the last 72 hours. Anemia Panel: Recent Labs    02/16/18 0454  FERRITIN 19  TIBC 184*  IRON 8*   Urine analysis:    Component Value Date/Time   COLORURINE Yellow 03/07/2012 1453   APPEARANCEUR CLOUDY 03/07/2012 1453   LABSPEC >=1.030 03/07/2012 1453   PHURINE 6.0 03/07/2012 1453   GLUCOSEU NEGATIVE 03/07/2012 1453   HGBUR MODERATE 03/07/2012 1453   BILIRUBINUR NEGATIVE 03/07/2012 1453   KETONESUR NEGATIVE 03/07/2012 1453   PROTEINUR >300 (A) 12/07/2007 1146   UROBILINOGEN 0.2 03/07/2012 1453   NITRITE NEGATIVE 03/07/2012 1453   LEUKOCYTESUR LARGE 03/07/2012 1453   Sepsis Labs: @LABRCNTIP (procalcitonin:4,lacticidven:4)  )No results found for this or any previous visit (from the past 240 hour(s)).    Studies: Dg Abd Portable 1v-small Bowel Obstruction Protocol-initial, 8 Hr Delay  Result Date: 02/16/2018 CLINICAL  DATA:  84 y/o F; small bowel protocol, 8 hour delayed images. EXAM: PORTABLE ABDOMEN - 1 VIEW COMPARISON:  02/15/2018 CT abdomen and pelvis. FINDINGS: Enteric tube tip projects over proximal stomach. Bilateral nephrostomy tubes in situ. Dilute contrast is present within loops of small bowel within the lower and right hemiabdomen. Persistent small bowel obstruction. Advanced degenerative changes of the lumbar spine with mild levocurvature apex at L3. IMPRESSION: Dilute contrast present within dilated small bowel loops in lower and right hemiabdomen, persistent small bowel obstruction. Electronically Signed   By: Kristine Garbe M.D.   On: 02/16/2018 22:42    Scheduled Meds: . chlorhexidine  15 mL Mouth Rinse BID  . heparin injection (subcutaneous)  5,000 Units Subcutaneous Q8H  . mouth rinse  15 mL Mouth Rinse q12n4p  . sodium chloride flush  3  mL Intravenous Q12H    Continuous Infusions: . dextrose 5% lactated ringers 75 mL/hr at 02/17/18 1127  . famotidine (PEPCID) IV Stopped (02/17/18 0749)  . piperacillin-tazobactam Stopped (02/17/18 0915)     LOS: 1 day     Kayleen Memos, MD [facility] Pager [phone]  If 7PM-7AM, please contact night-coverage [email or web address] Password [facility] 02/17/2018, 1:36 PM

## 2018-02-17 NOTE — Progress Notes (Signed)
Asked by surgery team to review CT abd & pelvis Incidental finding of multiple CBD stones and dilated CBD. Patient also has multiple gallstones, with largest  3 cm in size  No evidence of obstructive jaundice or LFT abnormality. No fever or leucocytosis to suggest cholangitis  Currently admitted with SBO  Patient can follow up with GI as outpatient after acute issues resolve  Please call if have any questions or concerns

## 2018-02-17 NOTE — Progress Notes (Signed)
Subjective/Chief Complaint: Still bloated, no bowel function, clear ng drainage, no pain   Objective: Vital signs in last 24 hours: Temp:  [97.7 F (36.5 C)-99.1 F (37.3 C)] 98.9 F (37.2 C) (07/13 0421) Pulse Rate:  [57-94] 57 (07/13 0421) Resp:  [16-18] 18 (07/13 0421) BP: (90-136)/(46-53) 98/46 (07/13 0421) SpO2:  [94 %-99 %] 97 % (07/13 0421) Last BM Date: 02/17/18  Intake/Output from previous day: 07/12 0701 - 07/13 0700 In: 3319.8 [I.V.:1721.8; Blood:718; NG/GT:230; IV Piggyback:650] Out: 2950 [Urine:600; Emesis/NG output:1700; Stool:650] Intake/Output this shift: No intake/output data recorded.  GI: mild distended no stoma output, no bs nontender  Lab Results:  Recent Labs    02/16/18 0454 02/16/18 2056 02/17/18 0433  WBC 6.0  --  9.6  HGB 6.1* 10.8* 9.4*  HCT 21.3* 35.5* 30.9*  PLT 298  --  201   BMET Recent Labs    02/16/18 0454 02/17/18 0433  NA 144 148*  K 3.1* 5.0  CL 113* 118*  CO2 20* 19*  GLUCOSE 71 68*  BUN 37* 36*  CREATININE 1.69* 1.95*  CALCIUM 8.5* 9.4   PT/INR No results for input(s): LABPROT, INR in the last 72 hours. ABG No results for input(s): PHART, HCO3 in the last 72 hours.  Invalid input(s): PCO2, PO2  Studies/Results: Ct Abdomen Pelvis Wo Contrast  Result Date: 02/15/2018 CLINICAL DATA:  84 year old female with history of colon cancer and colostomy presenting with obstruction. EXAM: CT ABDOMEN AND PELVIS WITHOUT CONTRAST TECHNIQUE: Multidetector CT imaging of the abdomen and pelvis was performed following the standard protocol without IV contrast. COMPARISON:  Abdominal radiograph dated 02/15/2018. FINDINGS: Evaluation of this exam is limited in the absence of intravenous contrast. Lower chest: The visualized lung bases are clear. There is hypoattenuation of the cardiac blood pool suggestive of a degree of anemia. Clinical correlation is recommended. No intra-abdominal free air or free fluid. Hepatobiliary: The liver is  unremarkable on this noncontrast CT. No intrahepatic biliary ductal dilatation. There are multiple stones within the gallbladder with the largest stone measuring approximately 3 cm in length. Ultrasound may provide better evaluation of the gallbladder if clinically indicated. No pericholecystic fluid. Multiple large stones noted common bile duct. These stones measure up to 11 mm in diameter. Pancreas: Unremarkable. No pancreatic ductal dilatation or surrounding inflammatory changes. Spleen: Normal in size without focal abnormality. Adrenals/Urinary Tract: The adrenal glands are unremarkable. Bilateral percutaneous nephrostomy tubes noted. The tip of the left nephrostomy tube is in the interpolar collecting system of the left kidney. There is mild fullness of the left renal collecting system and left ureter. There is no hydronephrosis on the right. Mechanical or Amplatzer device noted in the proximal left ureter, distal left ureter, as well as 2 additional devices in the distal right ureter. The urinary bladder is minimally distended and contains air-fluid level. This air is likely related to recent instrumentation, however an infectious process is not excluded. Correlation with urinalysis recommended. There is diffuse thickened and hazy appearance of the bladder wall with stranding of the perivesical fat. Stomach/Bowel: An enteric tube is partially visualized with tip in the body of the stomach. Postsurgical changes of partial colon resection with a left anterior colostomy. Contrast noted in the distal colon and rectum distal to the colostomy, likely related to rectally administered contrast. There is dilatation of fluid-filled loops of small bowel proximal to the colostomy measuring up to 5 cm in diameter. There is a probable transition in the right hemipelvis where there is abutment  and tethering of loops of bowel likely representing adhesions (coronal series 4 image 58 and axial series 2, image 55). Evaluation of  this area and small bowel is limited due to non opacification with oral contrast. Vascular/Lymphatic: There is moderate aortoiliac atherosclerotic disease. The abdominal aorta and IVC otherwise grossly unremarkable on this noncontrast CT. No portal venous gas. No definite adenopathy. Reproductive: Hysterectomy. There is an ill-defined 3.1 x 4.9 x 5.0 cm complex collection containing low attenuating fluid/debris and air within the pelvis in the region of the hysterectomy concerning for an infected collection or abscess. This collection may also represent a necrotic mass. There is apparent area of irregularity in the wall of the sigmoid colon (series 4, image 63 and axial series 2, image 52) which may represent an area of fistula between the sigmoid colon and this collection. Evaluation is very limited due to postsurgical changes and absence of contrast. There is diffuse infiltration of the pelvic floor fat and soft tissue infiltration of the posterior pelvis/perirectal space. Other: There is loss of subcutaneous fat and cachexia. There is a lipoma in the anterior left proximal thigh. Musculoskeletal: Osteopenia with degenerative changes of the spine and bilateral hips arthritis. No acute osseous pathology. IMPRESSION: 1. Small-bowel obstruction with transition within the pelvis likely secondary to adhesions. 2. Ill-defined complex collection within the pelvis anterior to the rectum and posterior to the bladder may represent an infected collection/abscess or a necrotic mass. There is irregularity of the wall of the sigmoid colon in this area which may represent a fistula tract extending into the collection. Evaluation is very limited due to postsurgical changes and absence of contrast. Clinical correlation is recommended. 3. Mild fullness of the left renal collecting system and ureter. Diffuse thickening of the bladder wall with air-fluid level content. This may be reactive or related to recent instrumentation or  represent cystitis. Correlation with urinalysis recommended. 4. Cholelithiasis as well as multiple stones within the common bile duct. 5.  Aortic Atherosclerosis (ICD10-I70.0). Electronically Signed   By: Anner Crete M.D.   On: 02/15/2018 21:56   Dg Abdomen Acute W/chest  Result Date: 02/15/2018 CLINICAL DATA:  Abdominal distention, evaluate for obstruction EXAM: DG ABDOMEN ACUTE W/ 1V CHEST COMPARISON:  04/22/2016 abdominal CT FINDINGS: A nasogastric tube tip overlaps the proximal stomach. There are bilateral percutaneous nephrostomy tubes. A nasogastric tube tip overlaps the proximal stomach. Dilated small bowel loops with fluid levels. No pneumoperitoneum. Bilateral ureteral occluders. Large lung volumes. There is no edema, consolidation, effusion, or pneumothorax. IMPRESSION: 1. Findings of small bowel obstruction. 2. Nasogastric tube in good position. Electronically Signed   By: Monte Fantasia M.D.   On: 02/15/2018 19:20   Dg Abd Portable 1v-small Bowel Obstruction Protocol-initial, 8 Hr Delay  Result Date: 02/16/2018 CLINICAL DATA:  84 y/o F; small bowel protocol, 8 hour delayed images. EXAM: PORTABLE ABDOMEN - 1 VIEW COMPARISON:  02/15/2018 CT abdomen and pelvis. FINDINGS: Enteric tube tip projects over proximal stomach. Bilateral nephrostomy tubes in situ. Dilute contrast is present within loops of small bowel within the lower and right hemiabdomen. Persistent small bowel obstruction. Advanced degenerative changes of the lumbar spine with mild levocurvature apex at L3. IMPRESSION: Dilute contrast present within dilated small bowel loops in lower and right hemiabdomen, persistent small bowel obstruction. Electronically Signed   By: Kristine Garbe M.D.   On: 02/16/2018 22:42    Anti-infectives: Anti-infectives (From admission, onward)   Start     Dose/Rate Route Frequency Ordered Stop   02/16/18  0400  piperacillin-tazobactam (ZOSYN) IVPB 2.25 g     2.25 g 100 mL/hr over 30  Minutes Intravenous Every 6 hours 02/16/18 0009     02/15/18 2215  piperacillin-tazobactam (ZOSYN) IVPB 3.375 g     3.375 g 100 mL/hr over 30 Minutes Intravenous  Once 02/15/18 2202 02/15/18 2333      Assessment/Plan: SBO - multiple past abdominal surgeries, likely due to adhesive disease - CT 7/11: sbo with transition within the pelvis, likely adhesions - patient having stool output from colostomy but reports this as decreased - 280 cc out overnight - NGT with clear output today -xray pending today, will check again tomorrow -would give her more time to resolve this conservatively due to history Intra-abdominal abscess vs necrotic mass - CT 7/11: ill-defined collection within pelvis, anterior to rectum and posterior to bladder. Possible fistulous tract extending to collection  - WBC 6.0 and patient afebrile - concerned that this may represent some sort of malignancy or given patient's rectal/vaginal drainage some sort of fistulous collection - ideally can be worked up as outpatient if this resolves  Recheck cr in am- may need more fluid, per primary team Would start pharm dvt proph no contraindication from our standpoint would not even hold if needs surgery given high risk nature    Rolm Bookbinder 02/17/2018

## 2018-02-18 ENCOUNTER — Other Ambulatory Visit (HOSPITAL_COMMUNITY): Payer: Medicare HMO

## 2018-02-18 ENCOUNTER — Inpatient Hospital Stay (HOSPITAL_COMMUNITY): Payer: Medicare HMO

## 2018-02-18 DIAGNOSIS — I495 Sick sinus syndrome: Secondary | ICD-10-CM

## 2018-02-18 DIAGNOSIS — K56609 Unspecified intestinal obstruction, unspecified as to partial versus complete obstruction: Secondary | ICD-10-CM

## 2018-02-18 LAB — BASIC METABOLIC PANEL
ANION GAP: 8 (ref 5–15)
BUN: 33 mg/dL — ABNORMAL HIGH (ref 8–23)
CALCIUM: 9.7 mg/dL (ref 8.9–10.3)
CHLORIDE: 120 mmol/L — AB (ref 98–111)
CO2: 21 mmol/L — AB (ref 22–32)
Creatinine, Ser: 1.89 mg/dL — ABNORMAL HIGH (ref 0.44–1.00)
GFR calc non Af Amer: 23 mL/min — ABNORMAL LOW (ref >60)
GFR, EST AFRICAN AMERICAN: 26 mL/min — AB (ref >60)
GLUCOSE: 153 mg/dL — AB (ref 70–99)
POTASSIUM: 3.7 mmol/L (ref 3.5–5.1)
Sodium: 149 mmol/L — ABNORMAL HIGH (ref 135–145)

## 2018-02-18 LAB — CBC
HEMATOCRIT: 32 % — AB (ref 36.0–46.0)
Hemoglobin: 9.6 g/dL — ABNORMAL LOW (ref 12.0–15.0)
MCH: 24.2 pg — ABNORMAL LOW (ref 26.0–34.0)
MCHC: 30 g/dL (ref 30.0–36.0)
MCV: 80.6 fL (ref 78.0–100.0)
Platelets: 189 10*3/uL (ref 150–400)
RBC: 3.97 MIL/uL (ref 3.87–5.11)
RDW: 21.3 % — AB (ref 11.5–15.5)
WBC: 6.6 10*3/uL (ref 4.0–10.5)

## 2018-02-18 LAB — MRSA PCR SCREENING: MRSA BY PCR: NEGATIVE

## 2018-02-18 LAB — PHOSPHORUS: Phosphorus: 2.6 mg/dL (ref 2.5–4.6)

## 2018-02-18 MED ORDER — ATROPINE SULFATE 0.4 MG/ML IJ SOLN
0.4000 mg | INTRAMUSCULAR | Status: DC | PRN
  Administered 2018-02-18 – 2018-02-19 (×4): 0.4 mg via INTRAVENOUS
  Filled 2018-02-18 (×6): qty 1

## 2018-02-18 MED ORDER — HYDROMORPHONE HCL 1 MG/ML IJ SOLN
1.0000 mg | Freq: Once | INTRAMUSCULAR | Status: AC
  Administered 2018-02-18: 1 mg via INTRAVENOUS
  Filled 2018-02-18: qty 1

## 2018-02-18 NOTE — Progress Notes (Addendum)
Subjective/Chief Complaint: No output in stoma, feels ok today, no complaints, still with drainage from what she thinks is vagina and rectum   Objective: Vital signs in last 24 hours: Temp:  [98.4 F (36.9 C)] 98.4 F (36.9 C) (07/13 1317) Pulse Rate:  [40-48] 40 (07/14 0505) Resp:  [14-18] 14 (07/14 0505) BP: (101-121)/(45-56) 121/56 (07/14 0505) SpO2:  [78 %-98 %] 96 % (07/14 0505) Last BM Date: 02/17/18  Intake/Output from previous day: 07/13 0701 - 07/14 0700 In: 266.3 [I.V.:266.3] Out: 1925 [Urine:525; Emesis/NG output:1400] Intake/Output this shift: No intake/output data recorded.  Resp: clear to auscultation bilaterally Cardio: bradycardia, rr GI: less distended no stoma output no bs nontender  Lab Results:  Recent Labs    02/17/18 0433 02/18/18 0536  WBC 9.6 6.6  HGB 9.4* 9.6*  HCT 30.9* 32.0*  PLT 201 189   BMET Recent Labs    02/17/18 0433 02/18/18 0536  NA 148* 149*  K 5.0 3.7  CL 118* 120*  CO2 19* 21*  GLUCOSE 68* 153*  BUN 36* 33*  CREATININE 1.95* 1.89*  CALCIUM 9.4 9.7   PT/INR No results for input(s): LABPROT, INR in the last 72 hours. ABG No results for input(s): PHART, HCO3 in the last 72 hours.  Invalid input(s): PCO2, PO2  Studies/Results: Dg Abd Portable 1v-small Bowel Obstruction Protocol-24 Hr Delay  Result Date: 02/17/2018 CLINICAL DATA:  Small bowel obstruction. EXAM: PORTABLE ABDOMEN - 1 VIEW COMPARISON:  02/16/2018 FINDINGS: Bilateral percutaneous nephrostomy tubes overlie the kidneys. There is a nasogastric tube with tip in the left upper quadrant of the abdomen. Dilated loop of bowel within the left hemiabdomen appears similar to previous exam. Dilute enteric contrast material within small bowel loops overlies the lower lumbar spine and sacrum. IMPRESSION: 1. No change in bowel gas pattern compared with previous exam. Electronically Signed   By: Kerby Moors M.D.   On: 02/17/2018 14:34   Dg Abd Portable 1v-small Bowel  Obstruction Protocol-initial, 8 Hr Delay  Result Date: 02/16/2018 CLINICAL DATA:  84 y/o F; small bowel protocol, 8 hour delayed images. EXAM: PORTABLE ABDOMEN - 1 VIEW COMPARISON:  02/15/2018 CT abdomen and pelvis. FINDINGS: Enteric tube tip projects over proximal stomach. Bilateral nephrostomy tubes in situ. Dilute contrast is present within loops of small bowel within the lower and right hemiabdomen. Persistent small bowel obstruction. Advanced degenerative changes of the lumbar spine with mild levocurvature apex at L3. IMPRESSION: Dilute contrast present within dilated small bowel loops in lower and right hemiabdomen, persistent small bowel obstruction. Electronically Signed   By: Kristine Garbe M.D.   On: 02/16/2018 22:42    Anti-infectives: Anti-infectives (From admission, onward)   Start     Dose/Rate Route Frequency Ordered Stop   02/16/18 0400  piperacillin-tazobactam (ZOSYN) IVPB 2.25 g     2.25 g 100 mL/hr over 30 Minutes Intravenous Every 6 hours 02/16/18 0009     02/15/18 2215  piperacillin-tazobactam (ZOSYN) IVPB 3.375 g     3.375 g 100 mL/hr over 30 Minutes Intravenous  Once 02/15/18 2202 02/15/18 2333      Assessment/Plan: SBO- multiple past abdominal surgeries, likely due to adhesive disease - CT 7/11: sbo with transition within the pelvis, likely adhesions with mass in pelvis -xray looks much better today -would give her more time to resolve this conservatively due to history Intra-abdominal abscess vs necrotic mass - CT 7/11: ill-defined collection within pelvis, anterior to rectum and posterior to bladder. Possible fistulous tract extending to collection -  WBC 6.6 and patient afebrile - concerned that this may represent some sort of malignancy or given patient's rectal/vaginal drainage some sort of fistulous collection - ideally can be worked up as outpatient if this resolves  -repeat ct may be indicated in her next 24-48 hours to see if this resolves and to  see if this process is involved in sbo now that she is decompressed Recheck cr in am- cr mildly improved today Would start pharm dvt proph no contraindication from our standpoint would not even hold if needs surgery given high risk nature    Rolm Bookbinder 02/18/2018

## 2018-02-18 NOTE — Progress Notes (Signed)
  Paged NP Bodenheimer about the Pt's low HR. Pt's HR has been in the low 30's since her admission an EKG has been completed which confirms her SB. No new orders at this time. I will continue to monitor.

## 2018-02-18 NOTE — Progress Notes (Addendum)
PROGRESS NOTE  [name] VQM:[id number] DOB: [birth year] DOA: 02/15/2018 PCP: Cassandria Anger, MD  HPI/Recap of past 24 hours: Sandra B Cookis a 84 y.o.femalewith medical history significant ofendometrial cancer s/p hysterectomy, sigmoidal cancer s/p partial bowel resection, ileostomy and colostomy bad placement, nephrolithiasis s/p bilateral nephrostomy tube placement, Hyponatremia, severe malnutrition, and insomnia, p/w worsening abdominal pain and distension since last week. CT abd pelvis w no contrast done on admission revealed SBO and suspected intra abdominal abscess vs necrotic mass. General surgery consulted and following. Highly appreciated.  02/16/18: Seen and examined at her bedside. NG tube to suction. Decreased stool output from colostomy bag. Pain is well controlled. Hg 6.1. 2U PRBCs to be transfused. Hold off chemical DVT ppx. AKI on CKD 3 on IV fluid. Hypokalemia 3.1, on IV Kcl replacement. Cholelithiasis and cholidocholithiasis, normal LFTs and normal bilirubin.  02/17/2018: Patient seen and examined at bedside.  She reports her abdominal pain is improved.  Less output from her NG tube and more output from her colostomy bag.  Also reports improvement of her nausea.  02/18/18: Seen and examined at her bedside. Has no new complaints however persistent bradycardia noted since yesterday. 12 leads EKG done personally reviewed revealed SB 35. Cardiology consulted, discussed with Dr Lovena Le who will see the patient.    Assessment/Plan: Principal Problem:   SBO (small bowel obstruction) (HCC) Active Problems:   CANCER, ENDOMETRIUM   FISTULA, ANAL   Abdominal pain, generalized   Insomnia   Colostomy in place (HCC)   Nausea   Abdominal distension   Abnormal CT of the abdomen   Hypokalemia   Hypoalbuminemia   Severe protein-calorie malnutrition (HCC)   Microcytic anemia   Acute renal failure superimposed on stage 3 chronic kidney disease (HCC)    Hypotension  Small bowel obstruction, suspect secondary to adhesions from multiple abdominal surgeries General surgery following NG tube to suction >600cc output this am No plan for surgery at this time Ambulate as tolerated Repeat abdominal x-ray improvement of SBO C/w NPO as recommended by Gen surgery Repeat CT abd pelvis in the am  Intra-abdominal abscess versus necrotic mass/vaginal-rectal fistula  Noted on CT abdomen and pelvis with no contrast done on admission Still lots of drainage with foul odor from fistula Obtain body fluid for culture Discussed with Radiation oncology Dr Tammi Klippel who will see the patient  Continue IV antibiotics Continue to monitor fever curve Continue to monitor WBC  Vaginal-Rectal fistula With foul smelling drainage Obtain body fluid for culture Radiation oncology Dr Tammi Klippel will see in consultation  Asymptomatic Sinus bradycardia Not on any rate controlled meds Discussed with cardiology-suspects 2/2 to severe malnutrition HR 33-35 Transfer to step down unit- Atropine and pacer pads at bedside  Severe protein calorie malnutrition Albumin 2.0 If Gen surgery recommends NPO tomorrow 02/19/18 will consider starting TNA   Hx of endometrial cancer s/p radiation  Radiation oncology Dr Tammi Klippel will see in consult  AKI on CKD 4, improving Creatinine 1.89 from 1.95 today from 1.6 Appears baseline is about 1.6 Avoid nephrotoxic agents/dehydration/hypotension/ Continue IV fluid hydration Monitor urine output  Iatrogenic hypernatremia Sodium 149 from 148 Stop IV normal saline C/w LR D5W Repeat BMP in the morning  Hx of colon cancer s/p resection and colostomy  Hx of nephrolithiasis s/p nephrostomy tube placement  Hyperkalemia, resolved  Chronic anemia/iron deficiency anemia Status post 2 units PRBC transfusion Hemoglobin 6.1 on presentation Transfused 2 units PRBC on 02/16/2018 Hg stable at 9.6 When safe to swallow or  at discharge, start  ferrous sulfate 325 mg BID Repeat CBC in the morning  Cholelithiasis/choledocholithiasis No sign of obstructive jaundice or transaminitis GI will follow outpatient    Code Status: Full code  Family Communication: None at bedside  Disposition Plan: Home possibly in 2 to 3 days   Consultants:  General surgery  Cardiology  Radiation oncology  Procedures:  None  Antimicrobials:  IV Zosyn  DVT prophylaxis: Subcu heparin 3 times daily   Objective: Vitals:   02/18/18 0505 02/18/18 1055 02/18/18 1130 02/18/18 1200  BP: (!) 121/56 (!) 124/47 (!) 135/59   Pulse: (!) 40 (!) 38 (!) 44   Resp: 14 16 16    Temp:  97.6 F (36.4 C) (!) 97.4 F (36.3 C) 97.6 F (36.4 C)  TempSrc:  Oral Oral Oral  SpO2: 96% 99% 100%   Weight:   41.7 kg (92 lb)   Height:   5\' 4"  (1.626 m)     Intake/Output Summary (Last 24 hours) at 02/18/2018 1429 Last data filed at 02/18/2018 1151 Gross per 24 hour  Intake 2083 ml  Output 2445 ml  Net -362 ml   Filed Weights   02/15/18 1740 02/16/18 0942 02/18/18 1130  Weight: 41.7 kg (92 lb) 40 kg (88 lb 2.9 oz) 41.7 kg (92 lb)    Exam:  . General: 84 y.o. year-old female severely emaciated. NAD A&O x 3. . Cardiovascular: RRR no rubs or gallops. No JVD or thyromegaly . Respiratory: Clear to auscultation with no wheezes or rales. Good inspiratory effort. . Abdomen: Soft nontender nondistended with bowel sounds.  Colostomy bag in place. . Musculoskeletal: No lower extremity edema. 2/4 pulses in all 4 extremities. . Skin: colostomy bag, NG tube, left nephrostomy tube in place. Marland Kitchen Psychiatry: Mood is appropriate for condition and setting   Data Reviewed: CBC: Recent Labs  Lab 02/15/18 1819 02/15/18 2333 02/16/18 0454 02/16/18 2056 02/17/18 0433 02/18/18 0536  WBC 7.2 6.7 6.0  --  9.6 6.6  NEUTROABS 6.0  --   --   --   --   --   HGB 7.9* 6.6* 6.1* 10.8* 9.4* 9.6*  HCT 27.5* 22.7* 21.3* 35.5* 30.9* 32.0*  MCV 72.8* 72.8* 73.2*  --  79.6  80.6  PLT 371 307 298  --  201 509   Basic Metabolic Panel: Recent Labs  Lab 02/15/18 1819 02/16/18 0454 02/17/18 0433 02/18/18 0536  NA 140 144 148* 149*  K 3.4* 3.1* 5.0 3.7  CL 108 113* 118* 120*  CO2 21* 20* 19* 21*  GLUCOSE 111* 71 68* 153*  BUN 40* 37* 36* 33*  CREATININE 1.92* 1.69* 1.95* 1.89*  CALCIUM 9.4 8.5* 9.4 9.7  MG  --   --  2.1  --   PHOS  --   --   --  2.6   GFR: Estimated Creatinine Clearance: 13.5 mL/min (A) (by C-G formula based on SCr of 1.89 mg/dL (H)). Liver Function Tests: Recent Labs  Lab 02/15/18 1819 02/17/18 0433  AST 13* 16  ALT 13 12  ALKPHOS 119 77  BILITOT 0.6 1.6*  PROT 6.3* 4.9*  ALBUMIN 2.6* 2.0*   Recent Labs  Lab 02/15/18 1819  LIPASE 20   No results for input(s): AMMONIA in the last 168 hours. Coagulation Profile: No results for input(s): INR, PROTIME in the last 168 hours. Cardiac Enzymes: No results for input(s): CKTOTAL, CKMB, CKMBINDEX, TROPONINI in the last 168 hours. BNP (last 3 results) No results for input(s): PROBNP in the  last 8760 hours. HbA1C: No results for input(s): HGBA1C in the last 72 hours. CBG: No results for input(s): GLUCAP in the last 168 hours. Lipid Profile: No results for input(s): CHOL, HDL, LDLCALC, TRIG, CHOLHDL, LDLDIRECT in the last 72 hours. Thyroid Function Tests: No results for input(s): TSH, T4TOTAL, FREET4, T3FREE, THYROIDAB in the last 72 hours. Anemia Panel: Recent Labs    02/16/18 0454  FERRITIN 19  TIBC 184*  IRON 8*   Urine analysis:    Component Value Date/Time   COLORURINE Yellow 03/07/2012 1453   APPEARANCEUR CLOUDY 03/07/2012 1453   LABSPEC >=1.030 03/07/2012 1453   PHURINE 6.0 03/07/2012 1453   GLUCOSEU NEGATIVE 03/07/2012 1453   HGBUR MODERATE 03/07/2012 1453   BILIRUBINUR NEGATIVE 03/07/2012 1453   KETONESUR NEGATIVE 03/07/2012 1453   PROTEINUR >300 (A) 12/07/2007 1146   UROBILINOGEN 0.2 03/07/2012 1453   NITRITE NEGATIVE 03/07/2012 1453   LEUKOCYTESUR  LARGE 03/07/2012 1453   Sepsis Labs: @LABRCNTIP (procalcitonin:4,lacticidven:4)  ) Recent Results (from the past 240 hour(s))  Blood culture (routine x 2)     Status: None (Preliminary result)   Collection Time: 02/15/18 10:20 PM  Result Value Ref Range Status   Specimen Description   Final    BLOOD BLOOD RIGHT FOREARM Performed at [facility], [place] [address] [place], [place] [address]    Special Requests   Final    BOTTLES DRAWN AEROBIC AND ANAEROBIC Blood Culture adequate volume Performed at [facility] [address], [place], [place] [address]    Culture   Final    NO GROWTH 1 DAY Performed at [facility], [place] [address] [place], [place] [address]    Report Status PENDING  Incomplete  Blood culture (routine x 2)     Status: None (Preliminary result)   Collection Time: 02/15/18 10:25 PM  Result Value Ref Range Status   Specimen Description   Final    BLOOD RIGHT ANTECUBITAL Performed at [facility] [address] [place], [place] [address]    Special Requests   Final    BOTTLES DRAWN AEROBIC AND ANAEROBIC Blood Culture adequate volume Performed at [facility] [address] [place], [place] [address]    Culture   Final    NO GROWTH 1 DAY Performed at [facility], [place] [address], [place], [place] [address]    Report Status PENDING  Incomplete  MRSA PCR Screening     Status: None   Collection Time: 02/18/18 12:50 PM  Result Value Ref Range Status   MRSA by PCR NEGATIVE NEGATIVE Final    Comment:        The GeneXpert MRSA Assay (FDA approved for NASAL specimens only), is one component of a comprehensive MRSA colonization surveillance program. It is not intended to diagnose MRSA infection nor to guide or monitor treatment for MRSA infections. Performed at [facility], [place] [address], [place], [place] [address]       Studies: Dg Abd Portable  1v  Result Date: 02/18/2018 CLINICAL DATA:  Small-bowel obstruction EXAM: PORTABLE ABDOMEN - 1 VIEW COMPARISON:  02/17/2018 FINDINGS: NG tube in the stomach. Nonobstructive bowel gas pattern. Little bowel gas is noted but no dilated loops are present. Bilateral nephrostomies remain in good position. Occlusion balloons are present in the distal ureters bilaterally unchanged IMPRESSION: Nonobstructive bowel gas pattern. Electronically Signed   By: Franchot Gallo M.D.   On: 02/18/2018 07:59    Scheduled  Meds: . chlorhexidine  15 mL Mouth Rinse BID  . heparin injection (subcutaneous)  5,000 Units Subcutaneous Q8H  . mouth rinse  15 mL Mouth Rinse q12n4p  . sodium chloride flush  3 mL Intravenous Q12H    Continuous Infusions: . dextrose 5% lactated ringers 75 mL/hr at 02/17/18 1127  . famotidine (PEPCID) IV Stopped (02/18/18 1143)  . piperacillin-tazobactam Stopped (02/18/18 0959)     LOS: 2 days     Kayleen Memos, MD [facility] Pager [phone]  If 7PM-7AM, please contact night-coverage [email or web address] Password [facility] 02/18/2018, 2:29 PM

## 2018-02-18 NOTE — Consult Note (Signed)
Cardiology Consultation:   Patient ID: [name]; [id number]; [birth year]   Admit date: 02/15/2018 Date of Consult: 02/18/2018  Primary Care Provider: Cassandria Anger, MD Primary Cardiologist: No primary care provider on file.  Primary Electrophysiologist:  none   Patient Profile:   [name] is a 84 y.o. female with a hx of CA, s/p colostomy and bilateral nephrostomy who is being seen today for the evaluation of sinus bradycardia at the request of Dr. Nevada Crane.  History of Present Illness:   [name] has a long h/o CA, and has a chronic colostomy for 20 years. The patient has had progressive problems with bowel/colostomy obstruction. She presented with another bowel obstruction. Her initial HR was in the 80-90 range. She has developed progressive sinus node dysfunction with heart rates in the 30's. She is asymptomatic. She was given atropine with improvement. She states"there is nothing wrong with my heart". No h/o syncope or bradycardia.   Past Medical History:  Diagnosis Date  . Anal fistula   . Anemia   . Arthritis   . Cancer of sigmoid colon ([place]) 02/16/2018  . Choledocholithiasis 02/15/2018  . Cholelithiasis 02/15/2018  . Colon perforation ([place])   . Colon polyp   . Colostomy in place [facility])   . Depression   . Endometrial cancer ([place])   . Fistula    vesicocolonic  . History of colon cancer   . History of nephrolithiasis   . LBP (low back pain)   . Malnutrition ([place])   . Nephrostomy status ([place])    replaced every 6-8 weeks  . Osteoporosis    Pt declined Rx  . 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 Small bowel obstruction ([place])   . Ureteral stenosis   . Vitamin B12 deficiency   . Vitamin D deficiency     Past Surgical History:  Procedure Laterality Date  . ABCESS DRAINAGE    . ABDOMINAL HYSTERECTOMY    . COLOSTOMY  07/12/2006   for colovesicle fistula - Dr Margot Chimes  . COLOSTOMY TAKEDOWN  07/17/2006   Dr Margot Chimes  . COLOSTOMY TAKEDOWN  01/13/2005   closed iliostomy -Dr Margot Chimes  . CYSTOSCOPY WITH LITHOLAPAXY N/A 04/17/2014   Procedure: CYSTOSCOPY WITH LITHOLAPAXY;  Surgeon: Jorja Loa, MD;  Location: [facility];  Service: Urology;  Laterality: N/A;  . iliostomy  2004   Done due to perf cecum at colonsocpy sone in [place]  . iliostomy revision  09/18/2002   for stenosis - Dr Margot Chimes  . LOW ANTERIOR BOWEL RESECTION  07/09/1988   For sigmoid cancer - Dr Margot Chimes  . NEPHROSTOMY  2008   bilateral- Dr. Jules Schick  . TONSILLECTOMY    . ureteral blockage      04/05/14   . URINARY DIVERSION       Home Medications:  Prior to Admission medications   Medication Sig Start Date End Date Taking? Authorizing Provider  Cholecalciferol (VITAMIN D3) 1000 UNITS tablet Take 1,000-2,000 Units by mouth 2 (two) times daily. 1,000 units at lunch and 2,000 units at dinner   Yes [provider]  Cyanocobalamin (VITAMIN B-12 PO) Place 3,000 mcg under the tongue daily.   Yes [provider]  furosemide (LASIX) 20 MG tablet TAKE 1 TABLET (20 MG TOTAL) BY MOUTH DAILY AS NEEDED. FOR SWELLING 12/04/17  Yes Plotnikov, Evie Lacks, MD  LUTEIN PO Take by mouth.   Yes [provider]  mirtazapine (REMERON) 15 MG tablet Take 1 tablet (15 mg total) by  mouth at bedtime. 02/14/18  Yes Plotnikov, Evie Lacks, MD  oxyCODONE-acetaminophen (PERCOCET) 10-325 MG tablet Take 1 tablet by mouth every 6 (six) hours as needed for pain. 02/14/18  Yes Plotnikov, Evie Lacks, MD  polyethylene glycol powder (GLYCOLAX/MIRALAX) powder Take 255 g by mouth once. 04/01/15  Yes Plotnikov, Evie Lacks, MD  Probiotic Product (ALIGN) 4 MG CAPS Take 4 mg by mouth daily. For intestinal flora restoration   Yes [provider]  temazepam (RESTORIL) 30 MG capsule TAKE 1 CAPSULE BY MOUTH AT BEDTIME AS NEEDED FOR SLEEP 02/14/18  Yes Plotnikov, Evie Lacks, MD    Inpatient Medications: Scheduled Meds: . chlorhexidine  15 mL Mouth Rinse BID  . heparin injection (subcutaneous)   5,000 Units Subcutaneous Q8H  . mouth rinse  15 mL Mouth Rinse q12n4p  . sodium chloride flush  3 mL Intravenous Q12H   Continuous Infusions: . dextrose 5% lactated ringers 75 mL/hr at 02/17/18 1127  . famotidine (PEPCID) IV Stopped (02/18/18 1143)  . piperacillin-tazobactam Stopped (02/18/18 0959)   PRN Meds: atropine, HYDROmorphone (DILAUDID) injection, ondansetron **OR** ondansetron (ZOFRAN) IV  Allergies:    Allergies  Allergen Reactions  . Ciprofloxacin Other (See Comments)    Stomach   . Flagyl [Metronidazole Hcl]     nausea  . Gabapentin     unknown  . Raloxifene     unknown  . Venlafaxine     Social History:   Social History   Socioeconomic History  . Marital status: Widowed    Spouse name: Not on file  . Number of children: 3  . Years of education: Not on file  . Highest education level: Not on file  Occupational History  . Occupation: Retired    Fish farm manager: RETIRED  Social Needs  . Financial resource strain: Not on file  . Food insecurity:    Worry: Not on file    Inability: Not on file  . Transportation needs:    Medical: Not on file    Non-medical: Not on file  Tobacco Use  . Smoking status: Current Every Day Smoker    Packs/day: 0.25    Types: Cigarettes  . Smokeless tobacco: Never Used  Substance and Sexual Activity  . Alcohol use: Yes    Comment: 1-2 drinks per week   . Drug use: No  . Sexual activity: Not Currently  Lifestyle  . Physical activity:    Days per week: Not on file    Minutes per session: Not on file  . Stress: Not on file  Relationships  . Social connections:    Talks on phone: Not on file    Gets together: Not on file    Attends religious service: Not on file    Active member of club or organization: Not on file    Attends meetings of clubs or organizations: Not on file    Relationship status: Not on file  . Intimate partner violence:    Fear of current or ex partner: Not on file    Emotionally abused: Not on file     Physically abused: Not on file    Forced sexual activity: Not on file  Other Topics Concern  . Not on file  Social History Narrative   Regular exercise-no    Family History:    Family History  Problem Relation Age of Onset  . Heart disease Father   . Hypertension Other   . Kidney disease Other      ROS:  Please see the history  of present illness.   All other ROS reviewed and negative.     Physical Exam/Data:   Vitals:   02/18/18 0505 02/18/18 1055 02/18/18 1130 02/18/18 1200  BP: (!) 121/56 (!) 124/47 (!) 135/59   Pulse: (!) 40 (!) 38 (!) 44   Resp: 14 16 16    Temp:  97.6 F (36.4 C) (!) 97.4 F (36.3 C) 97.6 F (36.4 C)  TempSrc:  Oral Oral Oral  SpO2: 96% 99% 100%   Weight:   92 lb (41.7 kg)   Height:   5\' 4"  (1.626 m)     Intake/Output Summary (Last 24 hours) at 02/18/2018 1426 Last data filed at 02/18/2018 1151 Gross per 24 hour  Intake 2083 ml  Output 2445 ml  Net -362 ml   Filed Weights   02/15/18 1740 02/16/18 0942 02/18/18 1130  Weight: 92 lb (41.7 kg) 88 lb 2.9 oz (40 kg) 92 lb (41.7 kg)   Body mass index is 15.79 kg/m.  General:  Cachectic, well developed, in no acute distress HEENT: normal Lymph: no adenopathy Neck: no JVD Endocrine:  No thryomegaly Vascular: No carotid bruits; FA pulses 2+ bilaterally without bruits  Cardiac:  normal S1, S2; RRR; no murmur  Lungs:  clear to auscultation bilaterally, no wheezing, rhonchi or rales  Abd: soft, nontender, no hepatomegaly  Ext: no edema Musculoskeletal:  No deformities, BUE and BLE strength normal and equal Skin: warm and dry  Neuro:  CNs 2-12 intact, no focal abnormalities noted Psych:  Normal affect   EKG:  The EKG was personally reviewed and demonstrates:  Sinus bradycardia Telemetry:  Telemetry was personally reviewed and demonstrates:  nsr  Relevant CV Studies: none  Laboratory Data:  Chemistry Recent Labs  Lab 02/16/18 0454 02/17/18 0433 02/18/18 0536  NA 144 148* 149*  K  3.1* 5.0 3.7  CL 113* 118* 120*  CO2 20* 19* 21*  GLUCOSE 71 68* 153*  BUN 37* 36* 33*  CREATININE 1.69* 1.95* 1.89*  CALCIUM 8.5* 9.4 9.7  GFRNONAA 26* 22* 23*  GFRAA 30* 25* 26*  ANIONGAP 11 11 8     Recent Labs  Lab 02/15/18 1819 02/17/18 0433  PROT 6.3* 4.9*  ALBUMIN 2.6* 2.0*  AST 13* 16  ALT 13 12  ALKPHOS 119 77  BILITOT 0.6 1.6*   Hematology Recent Labs  Lab 02/16/18 0454 02/16/18 2056 02/17/18 0433 02/18/18 0536  WBC 6.0  --  9.6 6.6  RBC 2.91*  --  3.88 3.97  HGB 6.1* 10.8* 9.4* 9.6*  HCT 21.3* 35.5* 30.9* 32.0*  MCV 73.2*  --  79.6 80.6  MCH 21.0*  --  24.2* 24.2*  MCHC 28.6*  --  30.4 30.0  RDW 18.5*  --  20.5* 21.3*  PLT 298  --  201 189   Cardiac EnzymesNo results for input(s): TROPONINI in the last 168 hours. No results for input(s): TROPIPOC in the last 168 hours.  BNPNo results for input(s): BNP, PROBNP in the last 168 hours.  DDimer No results for input(s): DDIMER in the last 168 hours.  Radiology/Studies:  Ct Abdomen Pelvis Wo Contrast  Result Date: 02/15/2018 CLINICAL DATA:  84 year old female with history of colon cancer and colostomy presenting with obstruction. EXAM: CT ABDOMEN AND PELVIS WITHOUT CONTRAST TECHNIQUE: Multidetector CT imaging of the abdomen and pelvis was performed following the standard protocol without IV contrast. COMPARISON:  Abdominal radiograph dated 02/15/2018. FINDINGS: Evaluation of this exam is limited in the absence of intravenous contrast. Lower chest: The  visualized lung bases are clear. There is hypoattenuation of the cardiac blood pool suggestive of a degree of anemia. Clinical correlation is recommended. No intra-abdominal free air or free fluid. Hepatobiliary: The liver is unremarkable on this noncontrast CT. No intrahepatic biliary ductal dilatation. There are multiple stones within the gallbladder with the largest stone measuring approximately 3 cm in length. Ultrasound may provide better evaluation of the  gallbladder if clinically indicated. No pericholecystic fluid. Multiple large stones noted common bile duct. These stones measure up to 11 mm in diameter. Pancreas: Unremarkable. No pancreatic ductal dilatation or surrounding inflammatory changes. Spleen: Normal in size without focal abnormality. Adrenals/Urinary Tract: The adrenal glands are unremarkable. Bilateral percutaneous nephrostomy tubes noted. The tip of the left nephrostomy tube is in the interpolar collecting system of the left kidney. There is mild fullness of the left renal collecting system and left ureter. There is no hydronephrosis on the right. Mechanical or Amplatzer device noted in the proximal left ureter, distal left ureter, as well as 2 additional devices in the distal right ureter. The urinary bladder is minimally distended and contains air-fluid level. This air is likely related to recent instrumentation, however an infectious process is not excluded. Correlation with urinalysis recommended. There is diffuse thickened and hazy appearance of the bladder wall with stranding of the perivesical fat. Stomach/Bowel: An enteric tube is partially visualized with tip in the body of the stomach. Postsurgical changes of partial colon resection with a left anterior colostomy. Contrast noted in the distal colon and rectum distal to the colostomy, likely related to rectally administered contrast. There is dilatation of fluid-filled loops of small bowel proximal to the colostomy measuring up to 5 cm in diameter. There is a probable transition in the right hemipelvis where there is abutment and tethering of loops of bowel likely representing adhesions (coronal series 4 image 58 and axial series 2, image 55). Evaluation of this area and small bowel is limited due to non opacification with oral contrast. Vascular/Lymphatic: There is moderate aortoiliac atherosclerotic disease. The abdominal aorta and IVC otherwise grossly unremarkable on this noncontrast CT.  No portal venous gas. No definite adenopathy. Reproductive: Hysterectomy. There is an ill-defined 3.1 x 4.9 x 5.0 cm complex collection containing low attenuating fluid/debris and air within the pelvis in the region of the hysterectomy concerning for an infected collection or abscess. This collection may also represent a necrotic mass. There is apparent area of irregularity in the wall of the sigmoid colon (series 4, image 63 and axial series 2, image 52) which may represent an area of fistula between the sigmoid colon and this collection. Evaluation is very limited due to postsurgical changes and absence of contrast. There is diffuse infiltration of the pelvic floor fat and soft tissue infiltration of the posterior pelvis/perirectal space. Other: There is loss of subcutaneous fat and cachexia. There is a lipoma in the anterior left proximal thigh. Musculoskeletal: Osteopenia with degenerative changes of the spine and bilateral hips arthritis. No acute osseous pathology. IMPRESSION: 1. Small-bowel obstruction with transition within the pelvis likely secondary to adhesions. 2. Ill-defined complex collection within the pelvis anterior to the rectum and posterior to the bladder may represent an infected collection/abscess or a necrotic mass. There is irregularity of the wall of the sigmoid colon in this area which may represent a fistula tract extending into the collection. Evaluation is very limited due to postsurgical changes and absence of contrast. Clinical correlation is recommended. 3. Mild fullness of the left renal collecting system  and ureter. Diffuse thickening of the bladder wall with air-fluid level content. This may be reactive or related to recent instrumentation or represent cystitis. Correlation with urinalysis recommended. 4. Cholelithiasis as well as multiple stones within the common bile duct. 5.  Aortic Atherosclerosis (ICD10-I70.0). Electronically Signed   By: Anner Crete M.D.   On: 02/15/2018  21:56   Dg Abdomen Acute W/chest  Result Date: 02/15/2018 CLINICAL DATA:  Abdominal distention, evaluate for obstruction EXAM: DG ABDOMEN ACUTE W/ 1V CHEST COMPARISON:  04/22/2016 abdominal CT FINDINGS: A nasogastric tube tip overlaps the proximal stomach. There are bilateral percutaneous nephrostomy tubes. A nasogastric tube tip overlaps the proximal stomach. Dilated small bowel loops with fluid levels. No pneumoperitoneum. Bilateral ureteral occluders. Large lung volumes. There is no edema, consolidation, effusion, or pneumothorax. IMPRESSION: 1. Findings of small bowel obstruction. 2. Nasogastric tube in good position. Electronically Signed   By: Monte Fantasia M.D.   On: 02/15/2018 19:20   Dg Abd Portable 1v  Result Date: 02/18/2018 CLINICAL DATA:  Small-bowel obstruction EXAM: PORTABLE ABDOMEN - 1 VIEW COMPARISON:  02/17/2018 FINDINGS: NG tube in the stomach. Nonobstructive bowel gas pattern. Little bowel gas is noted but no dilated loops are present. Bilateral nephrostomies remain in good position. Occlusion balloons are present in the distal ureters bilaterally unchanged IMPRESSION: Nonobstructive bowel gas pattern. Electronically Signed   By: Franchot Gallo M.D.   On: 02/18/2018 07:59   Dg Abd Portable 1v-small Bowel Obstruction Protocol-24 Hr Delay  Result Date: 02/17/2018 CLINICAL DATA:  Small bowel obstruction. EXAM: PORTABLE ABDOMEN - 1 VIEW COMPARISON:  02/16/2018 FINDINGS: Bilateral percutaneous nephrostomy tubes overlie the kidneys. There is a nasogastric tube with tip in the left upper quadrant of the abdomen. Dilated loop of bowel within the left hemiabdomen appears similar to previous exam. Dilute enteric contrast material within small bowel loops overlies the lower lumbar spine and sacrum. IMPRESSION: 1. No change in bowel gas pattern compared with previous exam. Electronically Signed   By: Kerby Moors M.D.   On: 02/17/2018 14:34   Dg Abd Portable 1v-small Bowel Obstruction  Protocol-initial, 8 Hr Delay  Result Date: 02/16/2018 CLINICAL DATA:  84 y/o F; small bowel protocol, 8 hour delayed images. EXAM: PORTABLE ABDOMEN - 1 VIEW COMPARISON:  02/15/2018 CT abdomen and pelvis. FINDINGS: Enteric tube tip projects over proximal stomach. Bilateral nephrostomy tubes in situ. Dilute contrast is present within loops of small bowel within the lower and right hemiabdomen. Persistent small bowel obstruction. Advanced degenerative changes of the lumbar spine with mild levocurvature apex at L3. IMPRESSION: Dilute contrast present within dilated small bowel loops in lower and right hemiabdomen, persistent small bowel obstruction. Electronically Signed   By: Kristine Garbe M.D.   On: 02/16/2018 22:42    Assessment and Plan:   1. Sinus bradycardia 2. Malnutrition 3. Small bowel obstruction Rec: I have discussed the treatment options. She needs more nutrition. If her diet cannot be re-initiated in the next 24 hours, she will need parenteral nutrition. Her sinus bradycardia is a consequence, not of sinus node dysfunction but due to a chronic lack of calories. Avoid AV or sinus node blocking drugs.   Gregg Taylor,M.D.   For questions or updates, please contact [place] Please consult [email or web address] for contact info under Cardiology/STEMI.   Signed, Cristopher Peru, MD  02/18/2018 2:26 PM

## 2018-02-18 NOTE — Plan of Care (Signed)
  Problem: Skin Integrity: Goal: Risk for impaired skin integrity will decrease Outcome: Progressing   

## 2018-02-18 NOTE — Progress Notes (Signed)
  Radiation Oncology         [phone] ________________________________  Name: SERGIO HOBART MRN: [id number]  Date: 02/15/2018  DOB: 03-Nov-1927  Chart Note:  I reviewed this patient's most recent findings and wanted to take a minute to document my impression.  Per report, She had a history of endometrial cancer post radiation therapy and hysterectomy in the 1970s.  Then she developed colon cancer in 1989 and after a hemicolectomy and cecal perforation after a colonoscopy has had an enterocutaneous fistula.  Over the past two decades since, she has had a complex history of intraabdominal adhesions, SBO and ureteral obstruction.  She is currently admitted with SBO and possible abscess versus necrotic mass in pelvis.  Agree with antibiotics and repeat CT in 24-48.  If malignancy/mass persists, CT biopsy/aspiration/culture may be warranted.  Otherwise, no current role for radiation therapy or specific post-radiation issues to clarify.  Will follow.  Palliative consult may be warranted to document goals of care in this very complex patient.   ________________________________  Sheral Apley. Tammi Klippel, M.D.

## 2018-02-19 ENCOUNTER — Inpatient Hospital Stay (HOSPITAL_COMMUNITY): Payer: Medicare HMO

## 2018-02-19 ENCOUNTER — Inpatient Hospital Stay: Payer: Self-pay

## 2018-02-19 DIAGNOSIS — R110 Nausea: Secondary | ICD-10-CM

## 2018-02-19 DIAGNOSIS — I361 Nonrheumatic tricuspid (valve) insufficiency: Secondary | ICD-10-CM

## 2018-02-19 LAB — BASIC METABOLIC PANEL
Anion gap: 5 (ref 5–15)
BUN: 26 mg/dL — ABNORMAL HIGH (ref 8–23)
CALCIUM: 9.3 mg/dL (ref 8.9–10.3)
CO2: 23 mmol/L (ref 22–32)
CREATININE: 1.55 mg/dL — AB (ref 0.44–1.00)
Chloride: 121 mmol/L — ABNORMAL HIGH (ref 98–111)
GFR calc Af Amer: 33 mL/min — ABNORMAL LOW (ref >60)
GFR, EST NON AFRICAN AMERICAN: 29 mL/min — AB (ref >60)
GLUCOSE: 111 mg/dL — AB (ref 70–99)
Potassium: 3.5 mmol/L (ref 3.5–5.1)
Sodium: 149 mmol/L — ABNORMAL HIGH (ref 135–145)

## 2018-02-19 LAB — ECHOCARDIOGRAM COMPLETE
Height: 64 in
Weight: 1472 oz

## 2018-02-19 LAB — GLUCOSE, CAPILLARY: Glucose-Capillary: 88 mg/dL (ref 70–99)

## 2018-02-19 MED ORDER — ATROPINE SULFATE 0.4 MG/ML IJ SOLN
0.4000 mg | INTRAMUSCULAR | Status: DC | PRN
  Filled 2018-02-19: qty 1

## 2018-02-19 MED ORDER — TRACE MINERALS CR-CU-MN-SE-ZN 10-1000-500-60 MCG/ML IV SOLN
INTRAVENOUS | Status: DC
  Filled 2018-02-19: qty 720

## 2018-02-19 MED ORDER — INSULIN ASPART 100 UNIT/ML ~~LOC~~ SOLN
0.0000 [IU] | SUBCUTANEOUS | Status: DC
  Administered 2018-02-20 – 2018-02-24 (×6): 1 [IU] via SUBCUTANEOUS

## 2018-02-19 MED ORDER — CHLORHEXIDINE GLUCONATE CLOTH 2 % EX PADS
6.0000 | MEDICATED_PAD | Freq: Every day | CUTANEOUS | Status: DC
  Administered 2018-02-20 – 2018-02-28 (×9): 6 via TOPICAL

## 2018-02-19 MED ORDER — FAT EMULSION PLANT BASED 20 % IV EMUL
120.0000 mL | INTRAVENOUS | Status: DC
  Filled 2018-02-19: qty 250

## 2018-02-19 MED ORDER — SODIUM CHLORIDE 0.9% FLUSH
10.0000 mL | INTRAVENOUS | Status: DC | PRN
  Administered 2018-02-22 – 2018-02-26 (×3): 10 mL
  Filled 2018-02-19 (×3): qty 40

## 2018-02-19 MED ORDER — OXYCODONE HCL 5 MG PO TABS
5.0000 mg | ORAL_TABLET | Freq: Four times a day (QID) | ORAL | Status: DC | PRN
  Filled 2018-02-19: qty 1

## 2018-02-19 MED ORDER — SODIUM CHLORIDE 0.9% FLUSH
10.0000 mL | Freq: Two times a day (BID) | INTRAVENOUS | Status: DC
  Administered 2018-02-19: 20 mL
  Administered 2018-02-20 – 2018-03-01 (×14): 10 mL

## 2018-02-19 MED ORDER — OXYCODONE-ACETAMINOPHEN 5-325 MG PO TABS
1.0000 | ORAL_TABLET | Freq: Four times a day (QID) | ORAL | Status: DC | PRN
  Filled 2018-02-19: qty 1

## 2018-02-19 NOTE — Progress Notes (Signed)
PROGRESS NOTE  [name] JXB:[id number] DOB: 11/03/1927 DOA: 02/15/2018 PCP: Cassandria Anger, MD  HPI/Recap of past 24 hours: [name] a 84 y.o.femalewith medical history significant ofendometrial cancer s/p hysterectomy, sigmoidal cancer s/p partial bowel resection, ileostomy and colostomy bad placement, nephrolithiasis s/p bilateral nephrostomy tube placement, Hyponatremia, severe malnutrition, and insomnia, p/w worsening abdominal pain and distension since last week. CT abd pelvis w no contrast done on admission revealed SBO and suspected intra abdominal abscess vs necrotic mass. General surgery consulted and following. Highly appreciated.  02/16/18: Seen and examined at her bedside. NG tube to suction. Decreased stool output from colostomy bag. Pain is well controlled. Hg 6.1. 2U PRBCs to be transfused. Hold off chemical DVT ppx. AKI on CKD 3 on IV fluid. Hypokalemia 3.1, on IV Kcl replacement. Cholelithiasis and cholidocholithiasis, normal LFTs and normal bilirubin.  02/17/2018: Patient seen and examined at bedside.  She reports her abdominal pain is improved.  Less output from her NG tube and more output from her colostomy bag.  Also reports improvement of her nausea.  02/18/18: Seen and examined at her bedside. Has no new complaints however persistent bradycardia noted since yesterday. 12 leads EKG done personally reviewed revealed SB 35. Cardiology consulted, discussed with Dr Lovena Le who will see the patient.  02/19/2018: Seen and examined at bedside.  Reports chronic joint pain in her hips and lower back.  Pain medications in place.  X-ray of abdomen revealed improvement of her SBO Discussed with Dr. Dema Severin who recommended continue n.p.o. and NG tube to suction.  Pharmacy consulted for TPN administration.    Assessment/Plan: Principal Problem:   SBO (small bowel obstruction) (HCC) Active Problems:   CANCER, ENDOMETRIUM   FISTULA, ANAL   Abdominal pain,  generalized   Insomnia   Colostomy in place (HCC)   Nausea   Abdominal distension   Abnormal CT of the abdomen   Hypokalemia   Hypoalbuminemia   Severe protein-calorie malnutrition (HCC)   Microcytic anemia   Acute renal failure superimposed on stage 3 chronic kidney disease (HCC)   Hypotension  Small bowel obstruction, suspect secondary to adhesions from multiple abdominal surgeries General surgery following Continue NG tube to suction and monitor output Advance NG tube to 5 cm as recommended by radiology  Intra-abdominal abscess versus necrotic mass/vaginal-rectal fistula  Noted on CT abdomen and pelvis with no contrast done on admission Still lots of drainage with foul odor from fistula Body fluid culture revealed few gram-negative rods Continue IV antibiotics Afebrile with no leukocytosis  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  Highly appreciated.  Hx of endometrial cancer s/p radiation  Radiation done more than 30 years ago  AKI on CKD 4, improving AKI continues to improve Creatinine 1.55 from 1.95 on presentation Baseline appears to be 1.5 Avoid nephrotoxic agents/dehydration/hypotension/ Continue IV fluid hydration Monitor urine output  Iatrogenic hypernatremia Sodium 149 Continue LR D5W  Hx of colon cancer s/p resection and colostomy  Hx of nephrolithiasis s/p nephrostomy tube placement  Hyperkalemia, resolved  Chronic anemia/iron deficiency anemia Status post 2 units PRBC transfusion Hemoglobin 6.1 on presentation Transfused 2 units PRBC on 02/16/2018 Hg stable at 9.6 When safe to swallow or at discharge, start  ferrous sulfate 325 mg BID Repeat CBC in the morning  Cholelithiasis/choledocholithiasis No sign of obstructive jaundice or transaminitis GI will follow outpatient    Code Status: Full code  Family Communication: None at bedside  Disposition Plan: Home possibly in 2 to 3 days   Consultants:  General surgery  Cardiology  Radiation oncology  Procedures:  None  Antimicrobials:  IV Zosyn  DVT prophylaxis: Subcu heparin 3 times daily   Objective: Vitals:   02/19/18 0800 02/19/18 0900 02/19/18 1000 02/19/18 1100  BP: 107/72 101/64 99/75 (!) 112/41  Pulse: (!) 51 (!) 50 (!) 54 (!) 41  Resp: (!) 33 (!) 23 14 16   Temp: 97.8 F (36.6 C)     TempSrc: Oral     SpO2: 95% (!) 83% 90% 97%  Weight:      Height:        Intake/Output Summary (Last 24 hours) at 02/19/2018 1130 Last data filed at 02/19/2018 1007 Gross per 24 hour  Intake 3851.67 ml  Output 770 ml  Net 3081.67 ml   Filed Weights   02/15/18 1740 02/16/18 0942 02/18/18 1130  Weight: 41.7 kg (92 lb) 40 kg (88 lb 2.9 oz) 41.7 kg (92 lb)    Exam:  . General: 84 y.o. year-old female severely emaciated.  In no acute distress alert oriented x3. . Cardiovascular: Regular rate and rhythm with no rubs or gallops.  No JVD or thyromegaly noted. Marland Kitchen Respiratory: Clear to auscultation with no wheezes or rales. Good inspiratory effort. . Abdomen: Soft nontender nondistended with bowel sounds.  Colostomy bag in place. . Musculoskeletal: No lower extremity edema. 2/4 pulses in all 4 extremities. . Skin: colostomy bag, NG tube, left nephrostomy tube in place. Marland Kitchen Psychiatry: Mood is appropriate for condition and setting   Data Reviewed: CBC: Recent Labs  Lab 02/15/18 1819 02/15/18 2333 02/16/18 0454 02/16/18 2056 02/17/18 0433 02/18/18 0536  WBC 7.2 6.7 6.0  --  9.6 6.6  NEUTROABS 6.0  --   --   --   --   --   HGB 7.9* 6.6* 6.1* 10.8* 9.4* 9.6*  HCT 27.5* 22.7* 21.3* 35.5* 30.9* 32.0*  MCV 72.8* 72.8* 73.2*   --  79.6 80.6  PLT 371 307 298  --  201 569   Basic Metabolic Panel: Recent Labs  Lab 02/15/18 1819 02/16/18 0454 02/17/18 0433 02/18/18 0536 02/19/18 0319  NA 140 144 148* 149* 149*  K 3.4* 3.1* 5.0 3.7 3.5  CL 108 113* 118* 120* 121*  CO2 21* 20* 19* 21* 23  GLUCOSE 111* 71 68* 153* 111*  BUN 40* 37* 36* 33* 26*  CREATININE 1.92* 1.69* 1.95* 1.89* 1.55*  CALCIUM 9.4 8.5* 9.4 9.7 9.3  MG  --   --  2.1  --   --   PHOS  --   --   --  2.6  --    GFR: Estimated Creatinine Clearance: 16.5 mL/min (A) (by C-G formula based on SCr of 1.55 mg/dL (H)). Liver Function Tests: Recent Labs  Lab 02/15/18 1819 02/17/18 0433  AST 13* 16  ALT 13 12  ALKPHOS 119 77  BILITOT 0.6 1.6*  PROT 6.3* 4.9*  ALBUMIN 2.6* 2.0*   Recent Labs  Lab 02/15/18 1819  LIPASE 20   No results for input(s): AMMONIA in the last 168 hours. Coagulation Profile: No results for input(s): INR, PROTIME in the last 168 hours. Cardiac Enzymes: No results for input(s): CKTOTAL, CKMB, CKMBINDEX, TROPONINI in the last 168 hours.  BNP (last 3 results) No results for input(s): PROBNP in the last 8760 hours. HbA1C: No results for input(s): HGBA1C in the last 72 hours. CBG: No results for input(s): GLUCAP in the last 168 hours. Lipid Profile: No results for input(s): CHOL, HDL, LDLCALC, TRIG, CHOLHDL, LDLDIRECT in the last 72 hours. Thyroid Function Tests: No results for input(s): TSH, T4TOTAL, FREET4, T3FREE, THYROIDAB in the last 72 hours. Anemia Panel: No results for input(s): VITAMINB12, FOLATE, FERRITIN, TIBC, IRON, RETICCTPCT in the last 72 hours. Urine analysis:    Component Value Date/Time   COLORURINE Yellow 03/07/2012 1453   APPEARANCEUR CLOUDY 03/07/2012 1453   LABSPEC >=1.030 03/07/2012 1453   PHURINE 6.0 03/07/2012 1453   GLUCOSEU NEGATIVE 03/07/2012 1453   HGBUR MODERATE 03/07/2012 1453   BILIRUBINUR NEGATIVE 03/07/2012 1453   KETONESUR NEGATIVE 03/07/2012 1453   PROTEINUR >300 (A)  12/07/2007 1146   UROBILINOGEN 0.2 03/07/2012 1453   NITRITE NEGATIVE 03/07/2012 1453   LEUKOCYTESUR LARGE 03/07/2012 1453   Sepsis Labs: @LABRCNTIP (procalcitonin:4,lacticidven:4)  ) Recent Results (from the past 240 hour(s))  Blood culture (routine x 2)     Status: None (Preliminary result)   Collection Time: 02/15/18 10:20 PM  Result Value Ref Range Status   Specimen Description   Final    BLOOD BLOOD RIGHT FOREARM Performed at [facility], [place] [address], [place], [place] [address]    Special Requests   Final    BOTTLES DRAWN AEROBIC AND ANAEROBIC Blood Culture adequate volume Performed at [facility] [address] [place], [place] [address]    Culture   Final    NO GROWTH 3 DAYS Performed at [facility], [place] [address] [place], [place] [address]    Report Status PENDING  Incomplete  Blood culture (routine x 2)     Status: None (Preliminary result)   Collection Time: 02/15/18 10:25 PM  Result Value Ref Range Status   Specimen Description   Final    BLOOD RIGHT ANTECUBITAL Performed at [facility] [address], [place], [place] [address]    Special Requests   Final    BOTTLES DRAWN AEROBIC AND ANAEROBIC Blood Culture adequate volume Performed at [facility] [address] [place], [place] [address]    Culture   Final    NO GROWTH 3 DAYS Performed at [facility], [place] [address] [place], [place] [address]    Report Status PENDING  Incomplete  MRSA PCR Screening     Status: None   Collection Time: 02/18/18 12:50 PM  Result Value Ref Range Status   MRSA by PCR NEGATIVE NEGATIVE Final    Comment:        The GeneXpert MRSA Assay (FDA approved for NASAL specimens only), is one component of a comprehensive MRSA colonization surveillance program. It is not intended to diagnose MRSA infection nor to guide or monitor treatment for MRSA infections. Performed at [facility], [place] [address], [place], [place] [address]   Body fluid culture     Status: None (Preliminary result)   Collection Time: 02/18/18  3:14 PM  Result Value Ref Range Status   Specimen Description   Final    HEMODIALYSIS FISTULA Performed at [facility] [address], [place], [place] [address]    Special Requests   Final    Normal Performed at [facility], [place] [address], [place],  [address]    Gram  Stain   Final    ABUNDANT WBC PRESENT, PREDOMINANTLY PMN ABUNDANT GRAM VARIABLE ROD    Culture   Final    FEW GRAM NEGATIVE RODS IDENTIFICATION AND SUSCEPTIBILITIES TO FOLLOW Performed at [facility], [place] [address] [place], [place] [address]    Report Status PENDING  Incomplete      Studies: Dg Abd Portable 1v  Result Date: 02/19/2018 CLINICAL DATA:  Small bowel obstruction EXAM: PORTABLE ABDOMEN - 1 VIEW COMPARISON:  February 18, 2018 abdominal radiograph and CT abdomen and pelvis February 15, 2018 FINDINGS: Nasogastric tube tip is in the stomach. The side port is at the gastroesophageal junction. There are pigtail catheters in the left and right abdomen regions, stable. There is no appreciable bowel dilatation or air-fluid level to suggest bowel obstruction. No free air evident. There is evidence of old trauma involving the superior pubic ramus on the right, stable. Lung bases are clear. Each IMPRESSION: No bowel dilatation or air-fluid level to suggest bowel obstruction. No free air. Note that the nasogastric tube side port is at the gastroesophageal junction. Advise advancing nasogastric tube approximately 5 cm. Pigtail catheters are noted on both sides in the abdomen, draining respective kidneys based on CT appearance. Electronically Signed   By: Lowella Grip III M.D.   On: 02/19/2018 07:15    Scheduled Meds: . chlorhexidine  15 mL Mouth Rinse BID  . heparin injection (subcutaneous)  5,000 Units  Subcutaneous Q8H  . mouth rinse  15 mL Mouth Rinse q12n4p  . sodium chloride flush  3 mL Intravenous Q12H    Continuous Infusions: . dextrose 5% lactated ringers 75 mL/hr at 02/19/18 0430  . famotidine (PEPCID) IV Stopped (02/19/18 1007)  . piperacillin-tazobactam Stopped (02/19/18 1020)     LOS: 3 days     Kayleen Memos, MD [facility] Pager [phone]  If 7PM-7AM, please contact night-coverage [email or web address] Password [email or web address] 02/19/2018, 11:30 AM

## 2018-02-19 NOTE — Progress Notes (Signed)
PHARMACY - ADULT TOTAL PARENTERAL NUTRITION CONSULT NOTE   Pharmacy Consult for TPN Indication: SBO  Patient Measurements: Height: 5\' 4"  (162.6 cm) Weight: 92 lb (41.7 kg) IBW/kg (Calculated) : 54.7 TPN AdjBW (KG): 41.7 Body mass index is 15.79 kg/m. Usual Weight: 45kg  Insulin Requirements: none ordered  Current Nutrition: NPO  IVF: D5LR at 44ml/hr  Central access: PICC ordered 7/15 TPN start date: possibly 7/15  ASSESSMENT                                                                                                          HPI: 84 yo female admitted 7/11 with worsening abdominal pain and distension since last week.CT abd pelvis w no contrast done on admission revealed SBO and suspected intra abdominal abscess vs necrotic mass. PMH includes endometrial cancer s/p hysterectomy, sigmoidal cancer s/p partial bowel resection, ileostomy and colostomy bag placement, nephrolithiasis s/p bilateral nephrostomy tube placement, hyponatremia, severe malnutrition, and insomnia.  CCS recommends to continue NPO, NGT to suction and begin TPN.  PICC ordered.  Significant events:   Today:    Glucose: No hx DM, serum glucose < 120  Electrolytes: K at lower limit of normal; Na elevated at 149, all others WNL  Renal: CKD 4 (baseline SCr ~1.5), SCr 1.92 on admit, down 1.55 today  LFTs: AST/ALT WNL, Tbili elevated at 1.6 on 7/13  TGs: check in AM  Prealbumin: check in AM  NUTRITIONAL GOALS                                                                                             RD recs on 7/12, update pending:  65-80 g/day protein, 1400-1600 kcal/day Clinimix / at a goal rate of 22ml/hr + 20% fat emulsion at 24ml/hr to provide: 72g/day protein, 1502Kcal/day.  PLAN                                                                                                                         If PICC placed, at 1800 today:   Start Clinimix E 5/15 at 81ml/hr.  20% fat emulsion at 24ml/hr x  12 hrs.  Plan to advance as tolerated to the  goal rate.  TPN to contain standard multivitamins and trace elements.  TPN to contain famotidine 20mg  (renally adjusted for CrCl < 11ml/min)  Reduce IVF to 19ml/hr.  Add SSI sensitive scale q8h .   TPN lab panels on Mondays & Thursdays.  F/u daily.  Peggyann Juba, PharmD, BCPS Pager: [phone] 02/19/2018,12:16 PM

## 2018-02-19 NOTE — Progress Notes (Signed)
Subjective/Chief Complaint: Stoma has now been putting out stool, feels fine today, no complaints. No n/v with NG tube in place   Objective: Vital signs in last 24 hours: Temp:  [97.2 F (36.2 C)-98.2 F (36.8 C)] 97.6 F (36.4 C) (07/15 0400) Pulse Rate:  [35-64] 45 (07/15 0630) Resp:  [15-34] 26 (07/15 0630) BP: (86-135)/(47-89) 90/71 (07/15 0630) SpO2:  [89 %-100 %] 89 % (07/15 0630) Weight:  [41.7 kg (92 lb)] 41.7 kg (92 lb) (07/14 1130) Last BM Date: 02/17/18  Intake/Output from previous day: 07/14 0701 - 07/15 0700 In: 3614.7 [I.V.:2928; IV Piggyback:686.7] Out: 770 [Urine:270; Emesis/NG output:500] Intake/Output this shift: No intake/output data recorded.  Resp: clear to auscultation bilaterally Cardio: bradycardia, rr GI: Soft, nontender, not significantly distended; ostomy appliance wit liquid stool in bag  Lab Results:  Recent Labs    02/17/18 0433 02/18/18 0536  WBC 9.6 6.6  HGB 9.4* 9.6*  HCT 30.9* 32.0*  PLT 201 189   BMET Recent Labs    02/18/18 0536 02/19/18 0319  NA 149* 149*  K 3.7 3.5  CL 120* 121*  CO2 21* 23  GLUCOSE 153* 111*  BUN 33* 26*  CREATININE 1.89* 1.55*  CALCIUM 9.7 9.3   PT/INR No results for input(s): LABPROT, INR in the last 72 hours. ABG No results for input(s): PHART, HCO3 in the last 72 hours.  Invalid input(s): PCO2, PO2  Studies/Results: Dg Abd Portable 1v  Result Date: 02/19/2018 CLINICAL DATA:  Small bowel obstruction EXAM: PORTABLE ABDOMEN - 1 VIEW COMPARISON:  February 18, 2018 abdominal radiograph and CT abdomen and pelvis February 15, 2018 FINDINGS: Nasogastric tube tip is in the stomach. The side port is at the gastroesophageal junction. There are pigtail catheters in the left and right abdomen regions, stable. There is no appreciable bowel dilatation or air-fluid level to suggest bowel obstruction. No free air evident. There is evidence of old trauma involving the superior pubic ramus on the right, stable. Lung  bases are clear. Each IMPRESSION: No bowel dilatation or air-fluid level to suggest bowel obstruction. No free air. Note that the nasogastric tube side port is at the gastroesophageal junction. Advise advancing nasogastric tube approximately 5 cm. Pigtail catheters are noted on both sides in the abdomen, draining respective kidneys based on CT appearance. Electronically Signed   By: Lowella Grip III M.D.   On: 02/19/2018 07:15   Dg Abd Portable 1v  Result Date: 02/18/2018 CLINICAL DATA:  Small-bowel obstruction EXAM: PORTABLE ABDOMEN - 1 VIEW COMPARISON:  02/17/2018 FINDINGS: NG tube in the stomach. Nonobstructive bowel gas pattern. Little bowel gas is noted but no dilated loops are present. Bilateral nephrostomies remain in good position. Occlusion balloons are present in the distal ureters bilaterally unchanged IMPRESSION: Nonobstructive bowel gas pattern. Electronically Signed   By: Franchot Gallo M.D.   On: 02/18/2018 07:59   Dg Abd Portable 1v-small Bowel Obstruction Protocol-24 Hr Delay  Result Date: 02/17/2018 CLINICAL DATA:  Small bowel obstruction. EXAM: PORTABLE ABDOMEN - 1 VIEW COMPARISON:  02/16/2018 FINDINGS: Bilateral percutaneous nephrostomy tubes overlie the kidneys. There is a nasogastric tube with tip in the left upper quadrant of the abdomen. Dilated loop of bowel within the left hemiabdomen appears similar to previous exam. Dilute enteric contrast material within small bowel loops overlies the lower lumbar spine and sacrum. IMPRESSION: 1. No change in bowel gas pattern compared with previous exam. Electronically Signed   By: Kerby Moors M.D.   On: 02/17/2018 14:34  Anti-infectives: Anti-infectives (From admission, onward)   Start     Dose/Rate Route Frequency Ordered Stop   02/16/18 0400  piperacillin-tazobactam (ZOSYN) IVPB 2.25 g     2.25 g 100 mL/hr over 30 Minutes Intravenous Every 6 hours 02/16/18 0009     02/15/18 2215  piperacillin-tazobactam (ZOSYN) IVPB 3.375 g      3.375 g 100 mL/hr over 30 Minutes Intravenous  Once 02/15/18 2202 02/15/18 2333      Assessment/Plan: SBO- multiple past abdominal surgeries, likely due to adhesive disease - CT 7/11: sbo with transition within the pelvis, likely adhesions with ?mass in pelvis  -xray looks normal; stoma now with output  -would give her more time to resolve this conservatively due to history Intra-abdominal abscess vs necrotic mass  - CT 7/11: ill-defined collection within pelvis, anterior to rectum and posterior to bladder. Possible fistulous tract extending to collection - WBC 6.6 and patient afebrile  - concerned that this may represent some sort of malignancy or given patient's rectal/vaginal drainage some sort of fistulous collection - ideally can be worked up as outpatient if this resolves   Would start pharm dvt proph no contraindication from our standpoint would not even hold if needs surgery given high risk nature    Ileana Roup 02/19/2018

## 2018-02-19 NOTE — Progress Notes (Signed)
Patient expressed feelings of hopelessness during rounds. She feels as if none of her treatments are benefiting her and that we keep telling her just "one more test one more stick" and that she's not improving. She also stated she feels like no one cares and that she is always at the bottom of everyone's list. Patient helped reposition in bed, talked with patient and she expressed that she didn't want to be un-bathed when her family gets here. Patient bathed and linen changed. Blinds opened patient states she feels beter, chaplain called.

## 2018-02-19 NOTE — Consult Note (Signed)
GYNECOLOGIC ONCOLOGY - [facility] at [facility] Su[facility] MD,PhD, Marti Sleigh MD, Precious Haws Md, Everitt Amber MD [place] at [facility] was requested by Dr. Nevada Crane for vaginal discharge   HPI: [name]  is a  nice 84 y.o.    She has a history of uterine cancer (with mets to the ovary per report) treated with hysterecomy/BSO and "cobolt" radiation int he 1970's.  Had a colon cancer and resection 1989.  Had ileostomy for ? Perforated colon on colonoscopy and then reversal. However ended up with E-C fistula 2004.  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   Current admission is for SBO and malnutrition. I am being called due to vaginal drainage. Denies significant bleeding but has had spotting on occasion as recently as end of 2018 when she saw urology.  Hasn't had a pelvic by Gyn for years.   Imported EPIC Oncologic History:    No history exists.    Measurement of disease:   Radiology: Ct Abdomen Pelvis Wo Contrast  Result Date: 02/15/2018 CLINICAL DATA:  83 year old female with history of colon cancer and colostomy presenting with obstruction. EXAM: CT ABDOMEN AND PELVIS WITHOUT CONTRAST TECHNIQUE: Multidetector CT imaging of the abdomen and pelvis was performed following the standard protocol without IV contrast. COMPARISON:  Abdominal radiograph dated 02/15/2018. FINDINGS: Evaluation of this exam is limited in the absence of intravenous contrast. Lower chest: The visualized lung bases are clear. There is hypoattenuation of the cardiac blood pool suggestive of a degree of anemia. Clinical correlation is recommended. No intra-abdominal free air or free fluid. Hepatobiliary: The liver is unremarkable on this noncontrast CT. No intrahepatic biliary ductal dilatation. There are multiple stones within the gallbladder with the largest stone measuring approximately 3 cm in length.  Ultrasound may provide better evaluation of the gallbladder if clinically indicated. No pericholecystic fluid. Multiple large stones noted common bile duct. These stones measure up to 11 mm in diameter. Pancreas: Unremarkable. No pancreatic ductal dilatation or surrounding inflammatory changes. Spleen: Normal in size without focal abnormality. Adrenals/Urinary Tract: The adrenal glands are unremarkable. Bilateral percutaneous nephrostomy tubes noted. The tip of the left nephrostomy tube is in the interpolar collecting system of the left kidney. There is mild fullness of the left renal collecting system and left ureter. There is no hydronephrosis on the right. Mechanical or Amplatzer device noted in the proximal left ureter, distal left ureter, as well as 2 additional devices in the distal right ureter. The urinary bladder is minimally distended and contains air-fluid level. This air is likely related to recent instrumentation, however an infectious process is not excluded. Correlation with urinalysis recommended. There is diffuse thickened and hazy appearance of the bladder wall with stranding of the perivesical fat. Stomach/Bowel: An enteric tube is partially visualized with tip in the body of the stomach. Postsurgical changes of partial colon resection with a left anterior colostomy. Contrast noted in the distal colon and rectum distal to the colostomy, likely related to rectally administered contrast. There is dilatation of fluid-filled loops of small bowel proximal to the colostomy measuring up to 5 cm in diameter. There is a probable transition in the right hemipelvis where there is abutment and tethering of loops of bowel likely representing adhesions (coronal series 4 image 58 and axial series 2, image 55). Evaluation of this area and small bowel is limited due to non opacification with oral  contrast. Vascular/Lymphatic: There is moderate aortoiliac atherosclerotic disease. The abdominal aorta and IVC  otherwise grossly unremarkable on this noncontrast CT. No portal venous gas. No definite adenopathy. Reproductive: Hysterectomy. There is an ill-defined 3.1 x 4.9 x 5.0 cm complex collection containing low attenuating fluid/debris and air within the pelvis in the region of the hysterectomy concerning for an infected collection or abscess. This collection may also represent a necrotic mass. There is apparent area of irregularity in the wall of the sigmoid colon (series 4, image 63 and axial series 2, image 52) which may represent an area of fistula between the sigmoid colon and this collection. Evaluation is very limited due to postsurgical changes and absence of contrast. There is diffuse infiltration of the pelvic floor fat and soft tissue infiltration of the posterior pelvis/perirectal space. Other: There is loss of subcutaneous fat and cachexia. There is a lipoma in the anterior left proximal thigh. Musculoskeletal: Osteopenia with degenerative changes of the spine and bilateral hips arthritis. No acute osseous pathology. IMPRESSION: 1. Small-bowel obstruction with transition within the pelvis likely secondary to adhesions. 2. Ill-defined complex collection within the pelvis anterior to the rectum and posterior to the bladder may represent an infected collection/abscess or a necrotic mass. There is irregularity of the wall of the sigmoid colon in this area which may represent a fistula tract extending into the collection. Evaluation is very limited due to postsurgical changes and absence of contrast. Clinical correlation is recommended. 3. Mild fullness of the left renal collecting system and ureter. Diffuse thickening of the bladder wall with air-fluid level content. This may be reactive or related to recent instrumentation or represent cystitis. Correlation with urinalysis recommended. 4. Cholelithiasis as well as multiple stones within the common bile duct. 5.  Aortic Atherosclerosis (ICD10-I70.0).  Electronically Signed   By: Anner Crete M.D.   On: 02/15/2018 21:56   Dg Abdomen Acute W/chest  Result Date: 02/15/2018 CLINICAL DATA:  Abdominal distention, evaluate for obstruction EXAM: DG ABDOMEN ACUTE W/ 1V CHEST COMPARISON:  04/22/2016 abdominal CT FINDINGS: A nasogastric tube tip overlaps the proximal stomach. There are bilateral percutaneous nephrostomy tubes. A nasogastric tube tip overlaps the proximal stomach. Dilated small bowel loops with fluid levels. No pneumoperitoneum. Bilateral ureteral occluders. Large lung volumes. There is no edema, consolidation, effusion, or pneumothorax. IMPRESSION: 1. Findings of small bowel obstruction. 2. Nasogastric tube in good position. Electronically Signed   By: Monte Fantasia M.D.   On: 02/15/2018 19:20   Dg Abd Portable 1v  Result Date: 02/19/2018 CLINICAL DATA:  Small bowel obstruction EXAM: PORTABLE ABDOMEN - 1 VIEW COMPARISON:  February 18, 2018 abdominal radiograph and CT abdomen and pelvis February 15, 2018 FINDINGS: Nasogastric tube tip is in the stomach. The side port is at the gastroesophageal junction. There are pigtail catheters in the left and right abdomen regions, stable. There is no appreciable bowel dilatation or air-fluid level to suggest bowel obstruction. No free air evident. There is evidence of old trauma involving the superior pubic ramus on the right, stable. Lung bases are clear. Each IMPRESSION: No bowel dilatation or air-fluid level to suggest bowel obstruction. No free air. Note that the nasogastric tube side port is at the gastroesophageal junction. Advise advancing nasogastric tube approximately 5 cm. Pigtail catheters are noted on both sides in the abdomen, draining respective kidneys based on CT appearance. Electronically Signed   By: Lowella Grip III M.D.   On: 02/19/2018 07:15   Dg Abd Portable 1v  Result Date: 02/18/2018  CLINICAL DATA:  Small-bowel obstruction EXAM: PORTABLE ABDOMEN - 1 VIEW COMPARISON:  02/17/2018  FINDINGS: NG tube in the stomach. Nonobstructive bowel gas pattern. Little bowel gas is noted but no dilated loops are present. Bilateral nephrostomies remain in good position. Occlusion balloons are present in the distal ureters bilaterally unchanged IMPRESSION: Nonobstructive bowel gas pattern. Electronically Signed   By: Franchot Gallo M.D.   On: 02/18/2018 07:59   Dg Abd Portable 1v-small Bowel Obstruction Protocol-24 Hr Delay  Result Date: 02/17/2018 CLINICAL DATA:  Small bowel obstruction. EXAM: PORTABLE ABDOMEN - 1 VIEW COMPARISON:  02/16/2018 FINDINGS: Bilateral percutaneous nephrostomy tubes overlie the kidneys. There is a nasogastric tube with tip in the left upper quadrant of the abdomen. Dilated loop of bowel within the left hemiabdomen appears similar to previous exam. Dilute enteric contrast material within small bowel loops overlies the lower lumbar spine and sacrum. IMPRESSION: 1. No change in bowel gas pattern compared with previous exam. Electronically Signed   By: Kerby Moors M.D.   On: 02/17/2018 14:34   Dg Abd Portable 1v-small Bowel Obstruction Protocol-initial, 8 Hr Delay  Result Date: 02/16/2018 CLINICAL DATA:  84 y/o F; small bowel protocol, 8 hour delayed images. EXAM: PORTABLE ABDOMEN - 1 VIEW COMPARISON:  02/15/2018 CT abdomen and pelvis. FINDINGS: Enteric tube tip projects over proximal stomach. Bilateral nephrostomy tubes in situ. Dilute contrast is present within loops of small bowel within the lower and right hemiabdomen. Persistent small bowel obstruction. Advanced degenerative changes of the lumbar spine with mild levocurvature apex at L3. IMPRESSION: Dilute contrast present within dilated small bowel loops in lower and right hemiabdomen, persistent small bowel obstruction. Electronically Signed   By: Kristine Garbe M.D.   On: 02/16/2018 22:42   [place] Ekg Site Rite  Result Date: 02/19/2018 If Site Rite image not attached, placement could not be confirmed due to  current cardiac rhythm. .  .  Allergy:  Allergies  Allergen Reactions  . Ciprofloxacin Other (See Comments)    Stomach   . Flagyl [Metronidazole Hcl]     nausea  . Gabapentin     unknown  . Raloxifene     unknown  . Venlafaxine     Past Surgical Hx:  Past Surgical History:  Procedure Laterality Date  . ABCESS DRAINAGE    . ABDOMINAL HYSTERECTOMY    . COLOSTOMY  07/12/2006   for colovesicle fistula - Dr Margot Chimes  . COLOSTOMY TAKEDOWN  07/17/2006   Dr Margot Chimes  . COLOSTOMY TAKEDOWN  01/13/2005   closed iliostomy -Dr Margot Chimes  . CYSTOSCOPY WITH LITHOLAPAXY N/A 04/17/2014   Procedure: CYSTOSCOPY WITH LITHOLAPAXY;  Surgeon: Jorja Loa, MD;  Location: [facility];  Service: Urology;  Laterality: N/A;  . iliostomy  2004   Done due to perf cecum at colonsocpy sone in [place]  . iliostomy revision  09/18/2002   for stenosis - Dr Margot Chimes  . LOW ANTERIOR BOWEL RESECTION  07/09/1988   For sigmoid cancer - Dr Margot Chimes  . NEPHROSTOMY  2008   bilateral- Dr. Jules Schick  . TONSILLECTOMY    . ureteral blockage      04/05/14   . URINARY DIVERSION      Past Medical Hx:  Past Medical History:  Diagnosis Date  . Anal fistula   . Anemia   . Arthritis   . Cancer of sigmoid colon ([place]) 02/16/2018  . Choledocholithiasis 02/15/2018  . Cholelithiasis 02/15/2018  . Colon perforation ([place])   . Colon polyp   . Colostomy in  place [facility])   . Depression   . Endometrial cancer ([place])   . Fistula    vesicocolonic  . History of colon cancer   . History of nephrolithiasis   . LBP (low back pain)   . Malnutrition ([place])   . Nephrostomy status ([place])    replaced every 6-8 weeks  . Osteoporosis    Pt declined Rx  . 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 Small bowel obstruction ([place])   . Ureteral stenosis   . Vitamin B12 deficiency   . Vitamin D deficiency    Family Hx:  Family History  Problem Relation Age of Onset  . Heart disease Father   . Hypertension Other   . Kidney disease Other      Social Hx:  Social History   Socioeconomic History  . Marital status: Widowed    Spouse name: Not on file  . Number of children: 3  . Years of education: Not on file  . Highest education level: Not on file  Occupational History  . Occupation: Retired    Fish farm manager: RETIRED  Social Needs  . Financial resource strain: Not on file  . Food insecurity:    Worry: Not on file    Inability: Not on file  . Transportation needs:    Medical: Not on file    Non-medical: Not on file  Tobacco Use  . Smoking status: Current Every Day Smoker    Packs/day: 0.25    Types: Cigarettes  . Smokeless tobacco: Never Used  Substance and Sexual Activity  . Alcohol use: Yes    Comment: 1-2 drinks per week   . Drug use: No  . Sexual activity: Not Currently  Lifestyle  . Physical activity:    Days per week: Not on file    Minutes per session: Not on file  . Stress: Not on file  Relationships  . Social connections:    Talks on phone: Not on file    Gets together: Not on file    Attends religious service: Not on file    Active member of club or organization: Not on file    Attends meetings of clubs or organizations: Not on file    Relationship status: Not on file  . Intimate partner violence:    Fear of current or ex partner: Not on file    Emotionally abused: Not on file    Physically abused: Not on file    Forced sexual activity: Not on file  Other Topics Concern  . Not on file  Social History Narrative   Regular exercise-no    Review of Systems: As in HPI. Relevant to GYN includes feculent vaginal discharge.  Vitals:  Vitals:   02/19/18 1200 02/19/18 1600  BP:    Pulse: (!) 50   Resp: (!) 28   Temp: 97.8 F (36.6 C) 97.9 F (36.6 C)  SpO2: 96%    Body mass index is 15.79 kg/m.   Physical Exam: General :  Cachetic 84 y.o., female in no apparent distress HEENT:  Normocephalic/atraumatic, symmetric, EOMI, eyelids normal Neck:   Supple, no masses.  Respiratory:   Respirations unlabored, no use of accessory muscles Musculoskeletal: No CVA tenderness, normal muscle strength. Bilateral PCN in place, although left has dressing off and nursing agreed to replace. Abdomen:  Thin, soft, non-tender and nondistended. No evidence of hernia. No masses. Extremities:  No lymphedema, no erythema, non-tender. Skin:   Normal inspection Neuro/Psych:  No focal motor deficit, no  abnormal mental status. Normal gait. Normal affect. Alert and oriented to person, place, and time  Genito Urinary: Vulva: Normal external female genitalia.  Bladder/urethra: Urethral meatus normal in size and location.  Speculum exam: unable to perform at bedside Bimanual exam: large necrotic defect, I believe, extending into posterior rectal fossa. Unable to perform rectal due to severe anal stenosis and patient discomfort. No obvious mass.   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 Workup for fistula per surgery o ? fistulogram - defer to surgery and primary team urgency  2. For drainage/discharge o Consider gentle douche per vagina. Would use 50% peroxide/saline. Careful not insert deep into vaginal canal o Confirm if surgery agrees, as they may want to hold off in the event this confuses picture of whether an abcess is present. 3. ? Pelvic mass versus other o Would not be recurrent uterine cancer, but with history of radiation other neoplasm as sequelae of radiation should be considered. o Again, workup for fistulous connection/collection per surgery o Available if surgery feels EUA warranted. 4. Plan per primary team/surgery o Call if follow-up or further recommendations felt to be necessary from [facility].   Isabel Caprice, MD  02/19/2018, 6:06 PM

## 2018-02-19 NOTE — Progress Notes (Signed)
  Echocardiogram 2D Echocardiogram has been performed.  [name] 02/19/2018, 9:19 AM

## 2018-02-19 NOTE — Progress Notes (Signed)
Peripherally Inserted Central Catheter/Midline Placement  The IV Nurse has discussed with the patient and/or persons authorized to consent for the patient, the purpose of this procedure and the potential benefits and risks involved with this procedure.  The benefits include less needle sticks, lab draws from the catheter, and the patient may be discharged home with the catheter. Risks include, but not limited to, infection, bleeding, blood clot (thrombus formation), and puncture of an artery; nerve damage and irregular heartbeat and possibility to perform a PICC exchange if needed/ordered by physician.  Alternatives to this procedure were also discussed.  Bard Power PICC patient education guide, fact sheet on infection prevention and patient information card has been provided to patient /or left at bedside.    PICC/Midline Placement Documentation  PICC Double Lumen 02/19/18 PICC Left Brachial 40 cm 0 cm (Active)  Indication for Insertion or Continuance of Line Administration of hyperosmolar/irritating solutions (i.e. TPN, Vancomycin, etc.);Poor Vasculature-patient has had multiple peripheral attempts or PIVs lasting less than 24 hours 02/19/2018 10:45 PM  Exposed Catheter (cm) 0 cm 02/19/2018 10:30 PM  Site Assessment Clean;Dry;Intact 02/19/2018 10:45 PM  Lumen #1 Status Flushed;Saline locked 02/19/2018 10:45 PM  Lumen #2 Status Flushed;Saline locked 02/19/2018 10:45 PM  Dressing Type Transparent;Occlusive 02/19/2018 10:45 PM  Dressing Status Clean;Dry;Intact;Antimicrobial disc in place 02/19/2018 10:45 PM  Line Care Connections checked and tightened 02/19/2018 10:45 PM  Dressing Intervention New dressing 02/19/2018 10:45 PM  Dressing Change Due 02/26/18 02/19/2018 10:45 PM       Gordan Payment 02/19/2018, 10:50 PM

## 2018-02-19 NOTE — Progress Notes (Signed)
Nutrition Follow-up  DOCUMENTATION CODES:   Underweight, Severe malnutrition in context of chronic illness  INTERVENTION:  - TPN initiation and advancement per Pharmacy. - Will continue to monitor for nutrition-related needs.   Monitor magnesium, potassium, and phosphorus daily for at least 3 days, MD to replete as needed, as pt is at risk for refeeding syndrome given severe malnutrition, underweight BMI, poor oral intake PTA and no nutrition x4 days.   NUTRITION DIAGNOSIS:   Severe Malnutrition related to chronic illness, poor appetite(history of endometrial and sigmoidal cancer, colostomy) as evidenced by severe fat depletion, severe muscle depletion, moderate muscle depletion. -ongoing  GOAL:   Patient will meet greater than or equal to 90% of their needs -unmet/unable to meet at this time.   MONITOR:   Weight trends, Labs, Skin, I & O's, Other (Comment)(TPN regimen)  REASON FOR ASSESSMENT:   Consult New TPN/TNA  ASSESSMENT:   84 year old female who presented to ED with colostomy blockage. PMH significant for multiple intra-abdominal surgeries and colon perforation, anal fistula, anemia, depression, nephrolithiasis s/p bilateral nephrostomy tube placement, endometrial cancer s/p hysterectomy, and sigmoidal cancer s/p partial bowel resection.  Weight stable since admission. NGT place to LIS on 7/11. She has had 600 mL out this shift. Consult received for TPN; estimated nutrition needs from 7/12 remain appropriate at this time. Consult placed for PICC placement. She has been NPO since admission (7/11).   Order in place for Clinimix E 5/15 @ 30 mL/hr with 20% ILE @ 5 mL/hr x12 hours/day. This regimen will provide 631 kcal and 36 grams of protein. Planned goal rate: Clinimix E 5/15 @ 60 mL/hr with 20% ILE @ 20 mL/hr x12 hours/day. This regimen will provide 1502 kcal and 72 grams of protein.    Per Dr. Juel Burrow note this AM: SBO suspected to be 2/2 adhesions from multiple abdominal  surgeries, plan for advancement of NGT by 5 cm as recommended by Radiology, copious amounts of drainage and foul odor from vaginal-rectal fistula, radiation >30 years ago for endometrial cancer, iatrogenic hypernatremia.    Medications reviewed; 20 mg IV Pepcid/day, sliding scale Novolog.  Labs reviewed; Na: 149 mmol/L, Cl: 121 mmol/L, BUN: 26 mg/dL, creatinine: 1.55 mg/dL, GFR: 29 mL/min.   IVF: D5-LR @ 75 mL/hr (306 kcal).    Diet Order:   Diet Order           Diet NPO time specified Except for: Ice Chips  Diet effective now          EDUCATION NEEDS:   No education needs have been identified at this time  Skin:  Skin Assessment: Skin Integrity Issues: Skin Integrity Issues:: Stage II Stage II: sacrum  Last BM:  7/15  Height:   Ht Readings from Last 1 Encounters:  02/18/18 5\' 4"  (1.626 m)    Weight:   Wt Readings from Last 1 Encounters:  02/18/18 92 lb (41.7 kg)    Ideal Body Weight:  56.82 kg  BMI:  Body mass index is 15.79 kg/m.  Estimated Nutritional Needs:   Kcal:  1400-1600 kcal/day  Protein:  65-80 grams/day  Fluid:  1.4-1.6 L/day     Jarome Matin, MS, RD, LDN, [facility] Inpatient Clinical Dietitian Pager # [phone] After hours/weekend pager # [phone]

## 2018-02-19 NOTE — Progress Notes (Addendum)
Progress Note  Patient Name: [name] Date of Encounter: 02/19/2018  Primary Cardiologist: Dorris Carnes, MD NEW  Subjective   She denies chest pain, SOB, no lightheadedness.   Inpatient Medications    Scheduled Meds: . chlorhexidine  15 mL Mouth Rinse BID  . heparin injection (subcutaneous)  5,000 Units Subcutaneous Q8H  . mouth rinse  15 mL Mouth Rinse q12n4p  . sodium chloride flush  3 mL Intravenous Q12H   Continuous Infusions: . dextrose 5% lactated ringers 75 mL/hr at 02/19/18 0430  . famotidine (PEPCID) IV Stopped (02/18/18 1143)  . piperacillin-tazobactam Stopped (02/19/18 0500)   PRN Meds: atropine, HYDROmorphone (DILAUDID) injection, ondansetron **OR** ondansetron (ZOFRAN) IV   Vital Signs    Vitals:   02/19/18 0005 02/19/18 0400 02/19/18 0600 02/19/18 0630  BP:  109/89 (!) 86/69 90/71  Pulse:  (!) 58 (!) 55 (!) 45  Resp:  (!) 24 20 (!) 26  Temp: 98.2 F (36.8 C) 97.6 F (36.4 C)    TempSrc: Oral Oral    SpO2:  96% 97% (!) 89%  Weight:      Height:        Intake/Output Summary (Last 24 hours) at 02/19/2018 0805 Last data filed at 02/19/2018 0600 Gross per 24 hour  Intake 3614.67 ml  Output 770 ml  Net 2844.67 ml   Filed Weights   02/15/18 1740 02/16/18 0942 02/18/18 1130  Weight: 92 lb (41.7 kg) 88 lb 2.9 oz (40 kg) 92 lb (41.7 kg)    Telemetry    SB to SR 33 to 64,  Mostly in upper 40s - Personally Reviewed  ECG    No new since yesterday with HR at 35  - Personally Reviewed  Physical Exam   GEN: No acute distress.  NG in place Neck: No JVD Cardiac: RRR, no murmurs, rubs, or gallops.  Respiratory: Clear to diminished to auscultation bilaterally. GI: Soft, nontender, non-distended  MS: No edema; No deformity. Neuro:  Nonfocal  Psych: Normal affect   Labs    Chemistry Recent Labs  Lab 02/15/18 1819  02/17/18 0433 02/18/18 0536 02/19/18 0319  NA 140   < > 148* 149* 149*  K 3.4*   < > 5.0 3.7 3.5  CL 108   < > 118* 120*  121*  CO2 21*   < > 19* 21* 23  GLUCOSE 111*   < > 68* 153* 111*  BUN 40*   < > 36* 33* 26*  CREATININE 1.92*   < > 1.95* 1.89* 1.55*  CALCIUM 9.4   < > 9.4 9.7 9.3  PROT 6.3*  --  4.9*  --   --   ALBUMIN 2.6*  --  2.0*  --   --   AST 13*  --  16  --   --   ALT 13  --  12  --   --   ALKPHOS 119  --  77  --   --   BILITOT 0.6  --  1.6*  --   --   GFRNONAA 22*   < > 22* 23* 29*  GFRAA 26*   < > 25* 26* 33*  ANIONGAP 11   < > 11 8 5    < > = values in this interval not displayed.     Hematology Recent Labs  Lab 02/16/18 0454 02/16/18 2056 02/17/18 0433 02/18/18 0536  WBC 6.0  --  9.6 6.6  RBC 2.91*  --  3.88 3.97  HGB  6.1* 10.8* 9.4* 9.6*  HCT 21.3* 35.5* 30.9* 32.0*  MCV 73.2*  --  79.6 80.6  MCH 21.0*  --  24.2* 24.2*  MCHC 28.6*  --  30.4 30.0  RDW 18.5*  --  20.5* 21.3*  PLT 298  --  201 189    Cardiac EnzymesNo results for input(s): TROPONINI in the last 168 hours. No results for input(s): TROPIPOC in the last 168 hours.   BNPNo results for input(s): BNP, PROBNP in the last 168 hours.   DDimer No results for input(s): DDIMER in the last 168 hours.   Radiology    Dg Abd Portable 1v  Result Date: 02/19/2018 CLINICAL DATA:  Small bowel obstruction EXAM: PORTABLE ABDOMEN - 1 VIEW COMPARISON:  February 18, 2018 abdominal radiograph and CT abdomen and pelvis February 15, 2018 FINDINGS: Nasogastric tube tip is in the stomach. The side port is at the gastroesophageal junction. There are pigtail catheters in the left and right abdomen regions, stable. There is no appreciable bowel dilatation or air-fluid level to suggest bowel obstruction. No free air evident. There is evidence of old trauma involving the superior pubic ramus on the right, stable. Lung bases are clear. Each IMPRESSION: No bowel dilatation or air-fluid level to suggest bowel obstruction. No free air. Note that the nasogastric tube side port is at the gastroesophageal junction. Advise advancing nasogastric tube  approximately 5 cm. Pigtail catheters are noted on both sides in the abdomen, draining respective kidneys based on CT appearance. Electronically Signed   By: Lowella Grip III M.D.   On: 02/19/2018 07:15   Dg Abd Portable 1v  Result Date: 02/18/2018 CLINICAL DATA:  Small-bowel obstruction EXAM: PORTABLE ABDOMEN - 1 VIEW COMPARISON:  02/17/2018 FINDINGS: NG tube in the stomach. Nonobstructive bowel gas pattern. Little bowel gas is noted but no dilated loops are present. Bilateral nephrostomies remain in good position. Occlusion balloons are present in the distal ureters bilaterally unchanged IMPRESSION: Nonobstructive bowel gas pattern. Electronically Signed   By: Franchot Gallo M.D.   On: 02/18/2018 07:59   Dg Abd Portable 1v-small Bowel Obstruction Protocol-24 Hr Delay  Result Date: 02/17/2018 CLINICAL DATA:  Small bowel obstruction. EXAM: PORTABLE ABDOMEN - 1 VIEW COMPARISON:  02/16/2018 FINDINGS: Bilateral percutaneous nephrostomy tubes overlie the kidneys. There is a nasogastric tube with tip in the left upper quadrant of the abdomen. Dilated loop of bowel within the left hemiabdomen appears similar to previous exam. Dilute enteric contrast material within small bowel loops overlies the lower lumbar spine and sacrum. IMPRESSION: 1. No change in bowel gas pattern compared with previous exam. Electronically Signed   By: Kerby Moors M.D.   On: 02/17/2018 14:34    Cardiac Studies   Echo for today -done results pending  Patient Profile     84 y.o. female with a hx of CA (endometrial and sigmoid), s/p colostomy for 20 years and bilateral nephrostomy, hyponatremia, now admitted with SBO intra-abdominal abscess vs. Necrotic mass and sinus brady, evaluated by Dr. Lovena Le and felt secondary to malnutrition.   Atropine improved HR.   Assessment & Plan    1.  Sinus bradycardia secondary to malnutrition did receive dose of atropine 0.4 mg this AM at 0157, and last night at [place].   2.    Malnutrition ? Tube feedings or IV nutrition.   3.   SBO- conservative management and she is improving.  Stoma with with stool.       For questions or updates, please contact [place]  Please consult [email or web address] for contact info under Cardiology/STEMI.      Signed, Cecilie Kicks, NP  02/19/2018, 8:05 AM    Pt seen  And examined   I agree with findings as noted by L [place] above Pt is an 84 yo with bradycardia   Most like due to severe malnutrition ON exam, pt is thin, in NAD Pulse 40s to 30s    BP 98 to 127/ Neck JVP normal    Lungs are CTA   Cardiac RRR   No S3 Ext without edema  No evid for pacemaker at this time   Bradycardia is most likely due to severe malnutrition     Continue telemetry   Will follow peripherally   Dorris Carnes

## 2018-02-20 DIAGNOSIS — I959 Hypotension, unspecified: Secondary | ICD-10-CM

## 2018-02-20 LAB — CBC
HCT: 35.2 % — ABNORMAL LOW (ref 36.0–46.0)
Hemoglobin: 10.5 g/dL — ABNORMAL LOW (ref 12.0–15.0)
MCH: 23.9 pg — ABNORMAL LOW (ref 26.0–34.0)
MCHC: 29.8 g/dL — ABNORMAL LOW (ref 30.0–36.0)
MCV: 80.2 fL (ref 78.0–100.0)
PLATELETS: 202 10*3/uL (ref 150–400)
RBC: 4.39 MIL/uL (ref 3.87–5.11)
RDW: 21.4 % — AB (ref 11.5–15.5)
WBC: 7 10*3/uL (ref 4.0–10.5)

## 2018-02-20 LAB — COMPREHENSIVE METABOLIC PANEL
ALT: 13 U/L (ref 0–44)
ANION GAP: 6 (ref 5–15)
AST: 20 U/L (ref 15–41)
Albumin: 2.1 g/dL — ABNORMAL LOW (ref 3.5–5.0)
Alkaline Phosphatase: 78 U/L (ref 38–126)
BUN: 21 mg/dL (ref 8–23)
CHLORIDE: 121 mmol/L — AB (ref 98–111)
CO2: 25 mmol/L (ref 22–32)
Calcium: 9.6 mg/dL (ref 8.9–10.3)
Creatinine, Ser: 1.54 mg/dL — ABNORMAL HIGH (ref 0.44–1.00)
GFR, EST AFRICAN AMERICAN: 34 mL/min — AB (ref >60)
GFR, EST NON AFRICAN AMERICAN: 29 mL/min — AB (ref >60)
Glucose, Bld: 103 mg/dL — ABNORMAL HIGH (ref 70–99)
POTASSIUM: 3.1 mmol/L — AB (ref 3.5–5.1)
SODIUM: 152 mmol/L — AB (ref 135–145)
Total Bilirubin: 0.6 mg/dL (ref 0.3–1.2)
Total Protein: 5.4 g/dL — ABNORMAL LOW (ref 6.5–8.1)

## 2018-02-20 LAB — PHOSPHORUS: PHOSPHORUS: 1.8 mg/dL — AB (ref 2.5–4.6)

## 2018-02-20 LAB — DIFFERENTIAL
BASOS PCT: 0 %
Basophils Absolute: 0 10*3/uL (ref 0.0–0.1)
EOS ABS: 0 10*3/uL (ref 0.0–0.7)
Eosinophils Relative: 0 %
Lymphocytes Relative: 15 %
Lymphs Abs: 1 10*3/uL (ref 0.7–4.0)
MONO ABS: 0.4 10*3/uL (ref 0.1–1.0)
MONOS PCT: 5 %
NEUTROS ABS: 5.6 10*3/uL (ref 1.7–7.7)
Neutrophils Relative %: 80 %

## 2018-02-20 LAB — GLUCOSE, CAPILLARY
GLUCOSE-CAPILLARY: 88 mg/dL (ref 70–99)
Glucose-Capillary: 109 mg/dL — ABNORMAL HIGH (ref 70–99)
Glucose-Capillary: 133 mg/dL — ABNORMAL HIGH (ref 70–99)

## 2018-02-20 LAB — MAGNESIUM: MAGNESIUM: 1.8 mg/dL (ref 1.7–2.4)

## 2018-02-20 LAB — TRIGLYCERIDES: Triglycerides: 101 mg/dL (ref <150)

## 2018-02-20 LAB — PREALBUMIN: Prealbumin: 10.2 mg/dL — ABNORMAL LOW (ref 18–38)

## 2018-02-20 MED ORDER — SODIUM CHLORIDE 0.9 % IV SOLN
1.5000 g | Freq: Two times a day (BID) | INTRAVENOUS | Status: AC
  Administered 2018-02-20 – 2018-02-22 (×4): 1.5 g via INTRAVENOUS
  Filled 2018-02-20 (×4): qty 1.5

## 2018-02-20 MED ORDER — POTASSIUM CL IN DEXTROSE 5% 20 MEQ/L IV SOLN: 20.0000 meq | INTRAVENOUS | Status: DC

## 2018-02-20 MED ORDER — SODIUM CHLORIDE 0.9 % IV SOLN
INTRAVENOUS | Status: DC
  Administered 2018-02-20 – 2018-02-24 (×2): via INTRAVENOUS

## 2018-02-20 MED ORDER — POTASSIUM CHLORIDE 10 MEQ/100ML IV SOLN
10.0000 meq | INTRAVENOUS | Status: AC
  Administered 2018-02-20 (×4): 10 meq via INTRAVENOUS
  Filled 2018-02-20 (×4): qty 100

## 2018-02-20 MED ORDER — DEXTROSE 5 % IV SOLN
INTRAVENOUS | Status: AC
  Administered 2018-02-21: 15:00:00 via INTRAVENOUS

## 2018-02-20 MED ORDER — TRACE MINERALS CR-CU-MN-SE-ZN 10-1000-500-60 MCG/ML IV SOLN
INTRAVENOUS | Status: AC
  Administered 2018-02-20: 18:00:00 via INTRAVENOUS
  Filled 2018-02-20 (×2): qty 720

## 2018-02-20 MED ORDER — FAT EMULSION PLANT BASED 20 % IV EMUL
120.0000 mL | INTRAVENOUS | Status: AC
  Administered 2018-02-20: 120 mL via INTRAVENOUS
  Filled 2018-02-20: qty 250

## 2018-02-20 MED ORDER — DEXTROSE 5 % IV SOLN
INTRAVENOUS | Status: AC
  Administered 2018-02-20: 07:00:00 via INTRAVENOUS

## 2018-02-20 MED ORDER — POTASSIUM PHOSPHATES 15 MMOLE/5ML IV SOLN
20.0000 mmol | Freq: Once | INTRAVENOUS | Status: AC
  Administered 2018-02-20: 20 mmol via INTRAVENOUS
  Filled 2018-02-20: qty 6.67

## 2018-02-20 NOTE — Progress Notes (Signed)
NO CHARGE   PALLIATIVE NOTE:  Spoke with patient's son [name] to arrange [place] meeting. He states he is off tomorrow and that will be more convenient for him. He is planning to discuss a time with his sister [name], and will then contact me back with suggested time.   Detailed note and recommendations to follow [place] meeting on 7/17.   Thank you for your referral.   Alda Lea, NP-BC Palliative Medicine Team  Phone: [phone] Fax: [phone] Pager: [phone] Amion: N. [place]

## 2018-02-20 NOTE — Progress Notes (Signed)
PROGRESS NOTE  [name] QVZ:[id number] DOB: November 09, 1927 DOA: 02/15/2018 PCP: Cassandria Anger, MD  HPI/Recap of past 24 hours: [name] a 84 y.o.femalewith medical history significant ofendometrial cancer s/p hysterectomy and radiation more than 30 years ago, sigmoidal cancer s/p partial bowel resection, ileostomy and colostomy bad placement, nephrolithiasis s/p bilateral nephrostomy tube placement, severe malnutrition, and insomnia, presented with complaints of worsening abdominal pain and distension of one week duration. CT abd pelvis w no contrast done on admission revealed SBO and suspected intra abdominal abscess vs necrotic mass. Choledocholithiasis and cholelithiasis with normal LFTs.  Admitted for small bowel obstruction.  NG tube to suction.  General surgery consulted and following.  On presentation severe anemia with hemoglobin of 6.1 status post 2 unit PRBC transfusion.  Also AKI on CKD 3.  Electrolyte abnormalities, repleted.  Hospital course complicated by sinus bradycardia and severe malnutrition.  Cardiology consulted and recommended treating malnutrition to improve sinus bradycardia.  Atropine and pacer pads at bedside.  02/20/2018: Patient seen and examined at her bedside.  She has no new complaints.  She denies nausea or abdominal pain.  Content noted in her colostomy bag.  NG tube to suction.  PICC line placed on 02/19/2018.  Pharmacy consulted for TPN administration.  N.p.o. per general surgery.     Assessment/Plan: Principal Problem:   SBO (small bowel obstruction) (HCC) Active Problems:   CANCER, ENDOMETRIUM   FISTULA, ANAL   Abdominal pain, generalized   Insomnia   Colostomy in place (HCC)   Nausea   Abdominal distension   Abnormal CT of the abdomen   Hypokalemia   Hypoalbuminemia   Severe protein-calorie malnutrition (HCC)   Microcytic anemia   Acute renal failure superimposed on stage 3 chronic kidney disease (HCC)    Hypotension  Small bowel obstruction, suspect secondary to adhesions from multiple abdominal surgeries General surgery following Continue NG tube to suction and monitor output Advance NG tube to 5 cm as recommended by radiology  Intra-abdominal abscess versus necrotic mass/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  Noted on CT abdomen and pelvis with no contrast done on admission Still lots of drainage from her vagina with foul odor from suspected fistula Defer to general surgery to obtain fistulogram if indicated. 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  Highly appreciated.  Hx of endometrial cancer s/p radiation  Radiation done more than 30 years ago  Acute hyponatremia Sodium 152 on 02/20/2018 Start D5W 20 mEq at 100 cc/h Repeat BMP this afternoon  Hypokalemia Suspect secondary to GI losses Potassium 3.1 Repleted with IV potassium and D5W  AKI on CKD 4, improving AKI continues to improve Creatinine 1.55 from 1.95 on presentation Baseline appears to be 1.5 Avoid nephrotoxic agents/dehydration/hypotension/ Continue IV fluid hydration Monitor urine output  Hx of colon cancer s/p resection and colostomy  Hx of nephrolithiasis s/p nephrostomy tube placement  Chronic anemia/iron deficiency  anemia FOBT negative Status post  2 units PRBC transfusion Hemoglobin 6.1 on presentation Transfused 2 units PRBC on 02/16/2018 Hg stable at 9.6 When safe to swallow or at discharge, start ferrous sulfate 325 mg BID Repeat CBC in the morning  Cholelithiasis/choledocholithiasis No sign of obstructive jaundice or transaminitis GI will follow outpatient    Code Status: Full code  Family Communication: None at bedside  Disposition Plan: Home possibly in 2 to 3 days   Consultants:  General surgery  Cardiology  Radiation oncology  GYN oncology  Procedures:  PICC line placement 02/19/2018  Antimicrobials:  IV Unasyn  DVT prophylaxis: Subcu heparin 3 times daily   Objective: Vitals:   02/20/18 1115 02/20/18 1200 02/20/18 1300 02/20/18 1400  BP:  130/77 126/78 (!) 119/59  Pulse:  (!) 54 (!) 51 (!) 50  Resp:  (!) 33 (!) 23 (!) 28  Temp: (!) 97.4 F (36.3 C)     TempSrc: Oral     SpO2:  97% 96% 90%  Weight:      Height:        Intake/Output Summary (Last 24 hours) at 02/20/2018 1554 Last data filed at 02/20/2018 1100 Gross per 24 hour  Intake 2412.08 ml  Output 1500 ml  Net 912.08 ml   Filed Weights   02/15/18 1740 02/16/18 0942 02/18/18 1130  Weight: 41.7 kg (92 lb) 40 kg (88 lb 2.9 oz) 41.7 kg (92 lb)    Exam:  . General: 84 y.o. year-old female cachectic in no acute distress.  Alert and oriented x3.   . Cardiovascular: Regular rate and rhythm with no rubs or gallops.  No JVD or thyromegaly noted.   Marland Kitchen Respiratory: Clear to auscultation with no wheezes or rales. Good inspiratory effort. . Abdomen: Soft nontender nondistended with bowel sounds.  Colostomy bag in place with stool in it. . Musculoskeletal: No lower extremity edema. 2/4 pulses in all 4 extremities. . Skin: colostomy bag, NG tube, bilateral nephrostomy tubes in place. Marland Kitchen Psychiatry: Mood is appropriate for condition and setting   Data Reviewed: CBC: Recent Labs  Lab 02/15/18 1819  02/15/18 2333 02/16/18 0454 02/16/18 2056 02/17/18 0433 02/18/18 0536 02/20/18 0316  WBC 7.2 6.7 6.0  --  9.6 6.6 7.0  NEUTROABS 6.0  --   --   --   --   --  5.6  HGB 7.9* 6.6* 6.1* 10.8* 9.4* 9.6* 10.5*  HCT 27.5* 22.7* 21.3* 35.5* 30.9* 32.0* 35.2*  MCV 72.8* 72.8* 73.2*  --  79.6 80.6 80.2  PLT 371 307 298  --  201 189 299   Basic Metabolic Panel: Recent Labs  Lab 02/16/18 0454 02/17/18 0433 02/18/18 0536 02/19/18 0319 02/20/18 0316  NA 144 148* 149* 149* 152*  K 3.1* 5.0 3.7 3.5 3.1*  CL 113* 118* 120* 121* 121*  CO2 20* 19* 21* 23 25  GLUCOSE 71 68* 153* 111* 103*  BUN 37* 36* 33* 26* 21  CREATININE 1.69* 1.95* 1.89* 1.55* 1.54*  CALCIUM 8.5* 9.4 9.7 9.3 9.6  MG  --  2.1  --   --  1.8  PHOS  --   --  2.6  --  1.8*   GFR: Estimated Creatinine Clearance: 16.6 mL/min (A) (by C-G formula based on SCr of 1.54 mg/dL (H)). Liver Function Tests: Recent Labs  Lab 02/15/18 1819 02/17/18 0433 02/20/18 0316  AST 13* 16 20  ALT 13 12 13   ALKPHOS 119 77 78  BILITOT 0.6 1.6* 0.6  PROT 6.3* 4.9* 5.4*  ALBUMIN 2.6* 2.0*  2.1*   Recent Labs  Lab 02/15/18 1819  LIPASE 20   No results for input(s): AMMONIA in the last 168 hours. Coagulation Profile: No results for input(s): INR, PROTIME in the last 168 hours. Cardiac Enzymes: No results for input(s): CKTOTAL, CKMB, CKMBINDEX, TROPONINI in the last 168 hours. BNP (last 3 results) No results for input(s): PROBNP in the last 8760 hours. HbA1C: No results for input(s): HGBA1C in the last 72 hours. CBG: Recent Labs  Lab 02/19/18 2153 02/20/18 0634 02/20/18 1328  GLUCAP 88 88 109*   Lipid Profile: Recent Labs    02/20/18 0316  TRIG 101   Thyroid Function Tests: No results for input(s): TSH, T4TOTAL, FREET4, T3FREE, THYROIDAB in the last 72 hours. Anemia Panel: No results for input(s): VITAMINB12, FOLATE, FERRITIN, TIBC, IRON, RETICCTPCT in the last 72 hours. Urine analysis:    Component Value Date/Time    COLORURINE Yellow 03/07/2012 1453   APPEARANCEUR CLOUDY 03/07/2012 1453   LABSPEC >=1.030 03/07/2012 1453   PHURINE 6.0 03/07/2012 1453   GLUCOSEU NEGATIVE 03/07/2012 1453   HGBUR MODERATE 03/07/2012 1453   BILIRUBINUR NEGATIVE 03/07/2012 1453   KETONESUR NEGATIVE 03/07/2012 1453   PROTEINUR >300 (A) 12/07/2007 1146   UROBILINOGEN 0.2 03/07/2012 1453   NITRITE NEGATIVE 03/07/2012 1453   LEUKOCYTESUR LARGE 03/07/2012 1453   Sepsis Labs: @LABRCNTIP (procalcitonin:4,lacticidven:4)  ) Recent Results (from the past 240 hour(s))  Blood culture (routine x 2)     Status: None (Preliminary result)   Collection Time: 02/15/18 10:20 PM  Result Value Ref Range Status   Specimen Description   Final    BLOOD BLOOD RIGHT FOREARM Performed at [facility], [place] [address], [place], [place] [address]    Special Requests   Final    BOTTLES DRAWN AEROBIC AND ANAEROBIC Blood Culture adequate volume Performed at [facility] [address], [place], [place] [address]    Culture   Final    NO GROWTH 4 DAYS Performed at [facility], [place] [address] [place], [place] [address]    Report Status PENDING  Incomplete  Blood culture (routine x 2)     Status: None (Preliminary result)   Collection Time: 02/15/18 10:25 PM  Result Value Ref Range Status   Specimen Description   Final    BLOOD RIGHT ANTECUBITAL Performed at [facility] [address] [place], [place] [address]    Special Requests   Final    BOTTLES DRAWN AEROBIC AND ANAEROBIC Blood Culture adequate volume Performed at [facility] [address] [place], [place] [address]    Culture   Final    NO GROWTH 4 DAYS Performed at [facility], [place] [address], [place], [place] [address]    Report Status PENDING  Incomplete  MRSA PCR Screening     Status: None   Collection Time: 02/18/18 12:50 PM  Result Value Ref Range Status   MRSA by PCR  NEGATIVE NEGATIVE Final    Comment:        The GeneXpert MRSA Assay (FDA approved for NASAL specimens only), is one component of a comprehensive MRSA colonization surveillance program. It is not intended to diagnose MRSA infection nor to guide or monitor treatment for MRSA infections. Performed at [facility], [place] [address], [place], [place] [address]   Body fluid culture     Status: None (Preliminary result)   Collection Time: 02/18/18  3:14 PM  Result Value Ref  Range Status   Specimen Description   Final    HEMODIALYSIS FISTULA Performed at [facility] [address] [place], [place] [address]    Special Requests   Final    Normal Performed at [facility], [place] [address] [place], [place] [address]    Gram Stain   Final    ABUNDANT WBC PRESENT, PREDOMINANTLY PMN ABUNDANT GRAM VARIABLE ROD Performed at [facility], [place] [address] [place], [place] [address]    Culture FEW ESCHERICHIA COLI  Final   Report Status PENDING  Incomplete   Organism ID, Bacteria ESCHERICHIA COLI  Final      Susceptibility   Escherichia coli - MIC*    AMPICILLIN 4 SENSITIVE Sensitive     CEFAZOLIN <=4 SENSITIVE Sensitive     CEFEPIME <=1 SENSITIVE Sensitive     CEFTAZIDIME <=1 SENSITIVE Sensitive     CEFTRIAXONE <=1 SENSITIVE Sensitive     CIPROFLOXACIN <=0.25 SENSITIVE Sensitive     GENTAMICIN <=1 SENSITIVE Sensitive     IMIPENEM <=0.25 SENSITIVE Sensitive     TRIMETH/SULFA <=20 SENSITIVE Sensitive     AMPICILLIN/SULBACTAM <=2 SENSITIVE Sensitive     PIP/TAZO <=4 SENSITIVE Sensitive     Extended ESBL NEGATIVE Sensitive     * FEW ESCHERICHIA COLI      Studies: No results found.  Scheduled Meds: . chlorhexidine  15 mL Mouth Rinse BID  . Chlorhexidine Gluconate Cloth  6 each Topical Daily  . heparin injection (subcutaneous)  5,000 Units Subcutaneous Q8H  . insulin aspart  0-9 Units Subcutaneous 3 times per day  .  mouth rinse  15 mL Mouth Rinse q12n4p  . sodium chloride flush  10-40 mL Intracatheter Q12H  . sodium chloride flush  3 mL Intravenous Q12H    Continuous Infusions: . sodium chloride Stopped (02/20/18 1053)  . ampicillin-sulbactam (UNASYN) IV 1.5 g (02/20/18 1533)  . dextrose 100 mL/hr at 02/20/18 1100  . dextrose    . TPN (CLINIMIX) Adult without lytes     And  . Fat emulsion    . potassium PHOSPHATE IVPB (in mmol) 84 mL/hr at 02/20/18 1100     LOS: 4 days     Kayleen Memos, MD [facility] Pager [phone]  If 7PM-7AM, please contact night-coverage [email or web address] Password [facility] 02/20/2018, 3:54 PM

## 2018-02-20 NOTE — Progress Notes (Signed)
NG tube clamped for 6 hour trial per MD White. Will continue to monitor patient.

## 2018-02-20 NOTE — Progress Notes (Signed)
PHARMACY - ADULT TOTAL PARENTERAL NUTRITION CONSULT NOTE   Pharmacy Consult for TPN Indication: SBO  Patient Measurements: Height: 5\' 4"  (162.6 cm) Weight: 92 lb (41.7 kg) IBW/kg (Calculated) : 54.7 TPN AdjBW (KG): 41.7 Body mass index is 15.79 kg/m. Usual Weight: 45kg  Insulin Requirements: none   Current Nutrition: NPO  IVF: D5 at 117ml/hr  Central access: Double lumen PICC  TPN start date: 7/16  ASSESSMENT                                                                                                          HPI: 84 yo female admitted 7/11 with worsening abdominal pain and distension since last week.CT abd pelvis w no contrast done on admission revealed SBO and suspected intra abdominal abscess vs necrotic mass. PMH includes endometrial cancer s/p hysterectomy, sigmoidal cancer s/p partial bowel resection, ileostomy and colostomy bag placement, nephrolithiasis s/p bilateral nephrostomy tube placement, hyponatremia, severe malnutrition, and insomnia.  CCS recommends to continue NPO, NGT to suction and begin TPN.  PICC ordered.  Significant events:   Today:    Glucose: No hx DM, serum glucose < 120  Electrolytes: K low at 3.1; Na elevated at 152, Phos low at 1.8  Renal: CKD 4 (baseline SCr ~1.5), SCr 1.92 on admit, down 1.54 today  LFTs: AST/ALT WNL, Tbili elevated at 1.6 on 7/13  TGs: 101 today  Prealbumin: 10.2 today  NUTRITIONAL GOALS                                                                                             RD recs on 7/15:  65-80 g/day protein, 1400-1600 kcal/day Clinimix / at a goal rate of 19ml/hr + 20% fat emulsion at 78ml/hr to provide: 72g/day protein, 1502Kcal/day.  PLAN                                                                                                                         Now: KCl 60meq IV x 4 runs; Potassium phosphate 74mmol IV x 1   Start Clinimix 5/15 (no electrolytes) at 28ml/hr.  20% fat emulsion at 47ml/hr x 12  hrs.  Plan to advance as  tolerated to the goal rate.  TPN to contain standard multivitamins and trace elements.  TPN to contain famotidine 20mg  (renally adjusted for CrCl < 27ml/min)  Reduce IVF to 8ml/hr.  Add SSI sensitive scale q8h .   TPN lab panels on Mondays & Thursdays.  F/u daily.  Peggyann Juba, PharmD, BCPS Pager: [phone] 02/20/2018,9:29 AM

## 2018-02-20 NOTE — Progress Notes (Addendum)
Subjective/Chief Complaint: Stoma has now been putting out stool, feels fine today, no complaints. No n/v with NG tube in place  Objective: Vital signs in last 24 hours: Temp:  [97.3 F (36.3 C)-98 F (36.7 C)] 97.4 F (36.3 C) (07/16 0400) Pulse Rate:  [41-54] 45 (07/16 0200) Resp:  [14-46] 41 (07/16 0600) BP: (93-128)/(41-88) 128/71 (07/16 0300) SpO2:  [83 %-100 %] 100 % (07/16 0200) Last BM Date: 02/19/18(colostomy pouch)  Intake/Output from previous day: 07/15 0701 - 07/16 0700 In: 2699.7 [P.O.:660; I.V.:1823; IV Piggyback:216.7] Out: 2100 [Urine:400; Emesis/NG output:1650; Stool:50] Intake/Output this shift: No intake/output data recorded.  Resp: clear to auscultation bilaterally Cardio: bradycardia, rr GI: Soft, nontender, not distended; ostomy appliance with liquid stool in bag   Lab Results:  Recent Labs    02/18/18 0536 02/20/18 0316  WBC 6.6 7.0  HGB 9.6* 10.5*  HCT 32.0* 35.2*  PLT 189 202   BMET Recent Labs    02/19/18 0319 02/20/18 0316  NA 149* 152*  K 3.5 3.1*  CL 121* 121*  CO2 23 25  GLUCOSE 111* 103*  BUN 26* 21  CREATININE 1.55* 1.54*  CALCIUM 9.3 9.6   PT/INR No results for input(s): LABPROT, INR in the last 72 hours. ABG No results for input(s): PHART, HCO3 in the last 72 hours.  Invalid input(s): PCO2, PO2  Studies/Results: Dg Abd Portable 1v  Result Date: 02/19/2018 CLINICAL DATA:  Small bowel obstruction EXAM: PORTABLE ABDOMEN - 1 VIEW COMPARISON:  February 18, 2018 abdominal radiograph and CT abdomen and pelvis February 15, 2018 FINDINGS: Nasogastric tube tip is in the stomach. The side port is at the gastroesophageal junction. There are pigtail catheters in the left and right abdomen regions, stable. There is no appreciable bowel dilatation or air-fluid level to suggest bowel obstruction. No free air evident. There is evidence of old trauma involving the superior pubic ramus on the right, stable. Lung bases are clear. Each IMPRESSION: No  bowel dilatation or air-fluid level to suggest bowel obstruction. No free air. Note that the nasogastric tube side port is at the gastroesophageal junction. Advise advancing nasogastric tube approximately 5 cm. Pigtail catheters are noted on both sides in the abdomen, draining respective kidneys based on CT appearance. Electronically Signed   By: Lowella Grip III M.D.   On: 02/19/2018 07:15   [place] Ekg Site Rite  Result Date: 02/19/2018 If Site Rite image not attached, placement could not be confirmed due to current cardiac rhythm.   Anti-infectives: Anti-infectives (From admission, onward)   Start     Dose/Rate Route Frequency Ordered Stop   02/16/18 0400  piperacillin-tazobactam (ZOSYN) IVPB 2.25 g     2.25 g 100 mL/hr over 30 Minutes Intravenous Every 6 hours 02/16/18 0009     02/15/18 2215  piperacillin-tazobactam (ZOSYN) IVPB 3.375 g     3.375 g 100 mL/hr over 30 Minutes Intravenous  Once 02/15/18 2202 02/15/18 2333      Assessment/Plan: SBO- multiple past abdominal surgeries, likely due to adhesive disease - CT 7/11: sbo with transition within the pelvis, likely adhesions with ?mass in pelvis - on gyn/onc exam - found large cavity in the pelvis with open vaginal cuff - presumably from prior radiation?  -xray looks normal; stoma now with output; will do clamping trial of NG today - clamp and if after 6hrs, no n/v/disention, ok to remove  -Would give her more time to resolve this conservatively due to history Intra-abdominal abscess vs necrotic mass - additional pelvic imaging  if fails to resolve - CT with contrast vs MRI.  -Would additionally obtain palliative care input regarding goals of care moving forward  - CT 7/11: ill-defined collection within pelvis, anterior to rectum and posterior to bladder. Possible fistulous tract extending to collection - WBC 6.6 and patient afebrile  Would start pharm dvt proph no contraindication from our standpoint would not even hold if  needs surgery given high risk nature  Ileana Roup 02/20/2018

## 2018-02-21 ENCOUNTER — Inpatient Hospital Stay (HOSPITAL_COMMUNITY): Payer: Medicare HMO

## 2018-02-21 DIAGNOSIS — E8809 Other disorders of plasma-protein metabolism, not elsewhere classified: Secondary | ICD-10-CM

## 2018-02-21 DIAGNOSIS — R140 Abdominal distension (gaseous): Secondary | ICD-10-CM

## 2018-02-21 DIAGNOSIS — E43 Unspecified severe protein-calorie malnutrition: Secondary | ICD-10-CM

## 2018-02-21 DIAGNOSIS — N179 Acute kidney failure, unspecified: Secondary | ICD-10-CM

## 2018-02-21 DIAGNOSIS — Z7189 Other specified counseling: Secondary | ICD-10-CM

## 2018-02-21 DIAGNOSIS — R0602 Shortness of breath: Secondary | ICD-10-CM

## 2018-02-21 DIAGNOSIS — Z66 Do not resuscitate: Secondary | ICD-10-CM

## 2018-02-21 DIAGNOSIS — Z933 Colostomy status: Secondary | ICD-10-CM

## 2018-02-21 DIAGNOSIS — R1084 Generalized abdominal pain: Secondary | ICD-10-CM

## 2018-02-21 DIAGNOSIS — Z515 Encounter for palliative care: Secondary | ICD-10-CM

## 2018-02-21 LAB — CBC
HCT: 30.7 % — ABNORMAL LOW (ref 36.0–46.0)
HEMATOCRIT: 32.2 % — AB (ref 36.0–46.0)
HEMOGLOBIN: 9.4 g/dL — AB (ref 12.0–15.0)
HEMOGLOBIN: 9.8 g/dL — AB (ref 12.0–15.0)
MCH: 24.2 pg — ABNORMAL LOW (ref 26.0–34.0)
MCH: 23.7 pg — ABNORMAL LOW (ref 26.0–34.0)
MCHC: 30.6 g/dL (ref 30.0–36.0)
MCHC: 30.4 g/dL (ref 30.0–36.0)
MCV: 78.9 fL (ref 78.0–100.0)
MCV: 77.8 fL — AB (ref 78.0–100.0)
Platelets: 135 10*3/uL — ABNORMAL LOW (ref 150–400)
Platelets: 140 10*3/uL — ABNORMAL LOW (ref 150–400)
RBC: 3.89 MIL/uL (ref 3.87–5.11)
RBC: 4.14 MIL/uL (ref 3.87–5.11)
RDW: 22 % — AB (ref 11.5–15.5)
RDW: 22.1 % — AB (ref 11.5–15.5)
WBC: 7 10*3/uL (ref 4.0–10.5)
WBC: 6.9 10*3/uL (ref 4.0–10.5)

## 2018-02-21 LAB — COMPREHENSIVE METABOLIC PANEL
ALK PHOS: 75 U/L (ref 38–126)
ALT: 14 U/L (ref 0–44)
AST: 21 U/L (ref 15–41)
Albumin: 1.8 g/dL — ABNORMAL LOW (ref 3.5–5.0)
Anion gap: 6 (ref 5–15)
BILIRUBIN TOTAL: 0.4 mg/dL (ref 0.3–1.2)
BUN: 18 mg/dL (ref 8–23)
CO2: 21 mmol/L — AB (ref 22–32)
CREATININE: 1.38 mg/dL — AB (ref 0.44–1.00)
Calcium: 8.8 mg/dL — ABNORMAL LOW (ref 8.9–10.3)
Chloride: 115 mmol/L — ABNORMAL HIGH (ref 98–111)
GFR calc Af Amer: 38 mL/min — ABNORMAL LOW (ref >60)
GFR, EST NON AFRICAN AMERICAN: 33 mL/min — AB (ref >60)
GLUCOSE: 139 mg/dL — AB (ref 70–99)
POTASSIUM: 3.5 mmol/L (ref 3.5–5.1)
Sodium: 142 mmol/L (ref 135–145)
Total Protein: 4.8 g/dL — ABNORMAL LOW (ref 6.5–8.1)

## 2018-02-21 LAB — BASIC METABOLIC PANEL
Anion gap: 7 (ref 5–15)
BUN: 19 mg/dL (ref 8–23)
CHLORIDE: 111 mmol/L (ref 98–111)
CO2: 21 mmol/L — AB (ref 22–32)
CREATININE: 1.33 mg/dL — AB (ref 0.44–1.00)
Calcium: 8.6 mg/dL — ABNORMAL LOW (ref 8.9–10.3)
GFR calc Af Amer: 40 mL/min — ABNORMAL LOW (ref >60)
GFR calc non Af Amer: 35 mL/min — ABNORMAL LOW (ref >60)
GLUCOSE: 159 mg/dL — AB (ref 70–99)
Potassium: 3.9 mmol/L (ref 3.5–5.1)
Sodium: 139 mmol/L (ref 135–145)

## 2018-02-21 LAB — TROPONIN I
Troponin I: 0.1 ng/mL (ref <0.03)
Troponin I: 0.1 ng/mL (ref <0.03)

## 2018-02-21 LAB — HEPATIC FUNCTION PANEL
ALK PHOS: 79 U/L (ref 38–126)
ALT: 15 U/L (ref 0–44)
AST: 18 U/L (ref 15–41)
Albumin: 2 g/dL — ABNORMAL LOW (ref 3.5–5.0)
Bilirubin, Direct: 0.1 mg/dL (ref 0.0–0.2)
Indirect Bilirubin: 0.3 mg/dL (ref 0.3–0.9)
TOTAL PROTEIN: 5.2 g/dL — AB (ref 6.5–8.1)
Total Bilirubin: 0.4 mg/dL (ref 0.3–1.2)

## 2018-02-21 LAB — MAGNESIUM
MAGNESIUM: 1.6 mg/dL — AB (ref 1.7–2.4)
Magnesium: 2 mg/dL (ref 1.7–2.4)

## 2018-02-21 LAB — CULTURE, BLOOD (ROUTINE X 2)
Culture: NO GROWTH
Culture: NO GROWTH
SPECIAL REQUESTS: ADEQUATE
Special Requests: ADEQUATE

## 2018-02-21 LAB — GLUCOSE, CAPILLARY
GLUCOSE-CAPILLARY: 139 mg/dL — AB (ref 70–99)
Glucose-Capillary: 112 mg/dL — ABNORMAL HIGH (ref 70–99)
Glucose-Capillary: 144 mg/dL — ABNORMAL HIGH (ref 70–99)

## 2018-02-21 LAB — LACTIC ACID, PLASMA: Lactic Acid, Venous: 1.7 mmol/L (ref 0.5–1.9)

## 2018-02-21 LAB — PHOSPHORUS
PHOSPHORUS: 2 mg/dL — AB (ref 2.5–4.6)
Phosphorus: 4.1 mg/dL (ref 2.5–4.6)

## 2018-02-21 MED ORDER — FAT EMULSION PLANT BASED 20 % IV EMUL
120.0000 mL | INTRAVENOUS | Status: AC
  Administered 2018-02-21: 120 mL via INTRAVENOUS
  Filled 2018-02-21: qty 250

## 2018-02-21 MED ORDER — LORAZEPAM 2 MG/ML IJ SOLN
1.0000 mg | INTRAMUSCULAR | Status: DC | PRN
  Administered 2018-02-21: 1 mg via INTRAVENOUS
  Filled 2018-02-21: qty 1

## 2018-02-21 MED ORDER — DEXTROSE 5 % IV SOLN
INTRAVENOUS | Status: DC
  Administered 2018-02-22 – 2018-02-23 (×2): via INTRAVENOUS

## 2018-02-21 MED ORDER — MAGNESIUM SULFATE 2 GM/50ML IV SOLN
2.0000 g | Freq: Once | INTRAVENOUS | Status: AC
  Administered 2018-02-21: 2 g via INTRAVENOUS
  Filled 2018-02-21: qty 50

## 2018-02-21 MED ORDER — POTASSIUM PHOSPHATES 15 MMOLE/5ML IV SOLN
30.0000 mmol | Freq: Once | INTRAVENOUS | Status: AC
  Administered 2018-02-21: 30 mmol via INTRAVENOUS
  Filled 2018-02-21: qty 10

## 2018-02-21 MED ORDER — TRACE MINERALS CR-CU-MN-SE-ZN 10-1000-500-60 MCG/ML IV SOLN
INTRAVENOUS | Status: AC
  Administered 2018-02-21: 18:00:00 via INTRAVENOUS
  Filled 2018-02-21: qty 960

## 2018-02-21 MED ORDER — FUROSEMIDE 10 MG/ML IJ SOLN
40.0000 mg | Freq: Once | INTRAMUSCULAR | Status: AC
  Administered 2018-02-21: 40 mg via INTRAVENOUS
  Filled 2018-02-21: qty 4

## 2018-02-21 NOTE — Progress Notes (Signed)
PROGRESS NOTE    [name]  YFV:[id number] DOB: [birth year] DOA: 02/15/2018 PCP: Cassandria Anger, MD     Brief Narrative:  [name] is a 84 y.o.femalewith medical history significant ofendometrial cancer s/p hysterectomy and radiation more than 30 years ago, sigmoidal cancer s/p partial bowel resection, ileostomy and colostomy bag placement, nephrolithiasis s/p bilateral nephrostomy tube placement, severe malnutrition, and insomnia, presented with complaints of worsening abdominal pain and distension of one week duration.CT abd pelvis w no contrast done on admission revealed SBO and suspected intra abdominal abscess vs necrotic mass. Choledocholithiasis and cholelithiasis with normal LFTs.  Admitted for small bowel obstruction. General surgery consulted and following.  Hospitalization also complicated by sinus bradycardia and severe malnutrition.  Cardiology was consulted who recommended treating malnutrition to improve sinus bradycardia.  New events last 24 hours / Subjective: On my first examination this morning, patient was doing better.  She was cleared to take sips of clear liquids.  She had no complaints of nausea, vomiting, abdominal pain.  I was called by RN in the afternoon regarding patient's clinical change.  She complained of some shortness of breath and shallow breathing.  Patient was reevaluated.  She states that she felt short of breath, no chest pain or pressure, no dizziness, no abdominal pain.  She did feel her abdominal wall was more tight/distended than was this morning and also complained of some soreness around her stoma.  No nausea or vomiting.  Assessment & Plan:   Principal Problem:   SBO (small bowel obstruction) (HCC) Active Problems:   CANCER, ENDOMETRIUM   FISTULA, ANAL   Abdominal pain, generalized   Insomnia   Colostomy in place (HCC)   Nausea   Abdominal distension   Abnormal CT of the abdomen   Hypokalemia   Hypoalbuminemia   Severe  protein-calorie malnutrition (HCC)   Microcytic anemia   Acute renal failure superimposed on stage 3 chronic kidney disease (HCC)   Hypotension   Small bowel obstruction, suspect secondary to adhesions from multiple abdominal surgeries -General surgery following -NGT removed -NPO with sips ordered   Intra-abdominal abscess versus necrotic mass/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  -Noted on CT abdomen and pelvis with no contrast done on admission -Appreciate gynecology oncology input -?Fistulogram if fails to resolve, defer to general surgery  -Body fluid culture 7/14 revealed Coli  -Continue IV antibiotics, switched from zosyn to IV unasyn on 02/20/18  Dyspnea -CXR independently reviewed, showed bilateral interstitial edema with small bilateral pleural effusions -Trial IV lasix today  Asymptomatic sinus bradycardia  -Cardiology consulted, suspects 2/2 to severe malnutrition  Severe protein calorie malnutrition -TPN per pharmacy   AKI on CKD 4, improving -Baseline Cr appears to be 1.5 -Cr 1.33 today   Hx of endometrial cancer s/p radiation  -Radiation done more than 30 years ago  Hx of colon cancer s/p resection and colostomy  Hx of nephrolithiasis s/p nephrostomy tube placement  Chronic anemia/iron deficiency anemia -FOBT negative -Status post 2 units PRBC transfusion -Hemoglobin 6.1 on presentation -Transfused 2 units PRBC on 02/16/2018 -Hg stable  Cholelithiasis/choledocholithiasis -No sign of obstructive jaundice or transaminitis -GI will follow outpatient   DVT prophylaxis: Subq hep  Code Status: Full Family Communication: No family at bedside Disposition Plan: Pending clinical improvement   Consultants:   General surgery  Cardiology  Radiation oncology  Gyn oncology  Palliative care team   Procedures:   PICC 7/15   Antimicrobials:  Anti-infectives (From admission, onward)   Start  Dose/Rate Route Frequency Ordered Stop    02/20/18 1600  ampicillin-sulbactam (UNASYN) 1.5 g in sodium chloride 0.9 % 100 mL IVPB     1.5 g 200 mL/hr over 30 Minutes Intravenous Every 12 hours 02/20/18 1134 02/22/18 1559   02/16/18 0400  piperacillin-tazobactam (ZOSYN) IVPB 2.25 g  Status:  Discontinued     2.25 g 100 mL/hr over 30 Minutes Intravenous Every 6 hours 02/16/18 0009 02/20/18 1134   02/15/18 2215  piperacillin-tazobactam (ZOSYN) IVPB 3.375 g     3.375 g 100 mL/hr over 30 Minutes Intravenous  Once 02/15/18 2202 02/15/18 2333        Objective: Vitals:   02/21/18 1200 02/21/18 1300 02/21/18 1305 02/21/18 1400  BP: 132/89 (!) 72/32 99/70 (!) 88/67  Pulse: 60 (!) 58 64 (!) 58  Resp: (!) 23 (!) 25 (!) 22 (!) 27  Temp: 98.7 F (37.1 C)     TempSrc: Oral     SpO2: 97% 91% 94% 98%  Weight:      Height:        Intake/Output Summary (Last 24 hours) at 02/21/2018 1427 Last data filed at 02/21/2018 1100 Gross per 24 hour  Intake 2875.1 ml  Output 625 ml  Net 2250.1 ml   Filed Weights   02/15/18 1740 02/16/18 0942 02/18/18 1130  Weight: 41.7 kg (92 lb) 40 kg (88 lb 2.9 oz) 41.7 kg (92 lb)    Examination:  General exam: Appears calm and comfortable  Respiratory system: Clear to auscultation. Respiratory effort normal. Cardiovascular system: S1 & S2 heard, bradycardic, regular rhythm. No JVD, murmurs, rubs, gallops or clicks. No pedal edema. Gastrointestinal system: Abdomen is nondistended, soft and nontender. No organomegaly or masses felt. Normal bowel sounds heard. +stoma  GU: bilateral nephrostomy bags  Central nervous system: Alert and oriented. No focal neurological deficits. Extremities: Symmetric 5 x 5 power. Skin: No rashes, lesions or ulcers Psychiatry: Judgement and insight appear normal. Mood & affect appropriate.   Data Reviewed: I have personally reviewed following labs and imaging studies  CBC: Recent Labs  Lab 02/15/18 1819  02/17/18 0433 02/18/18 0536 02/20/18 0316 02/21/18 0305  02/21/18 1352  WBC 7.2   < > 9.6 6.6 7.0 7.0 6.9  NEUTROABS 6.0  --   --   --  5.6  --   --   HGB 7.9*   < > 9.4* 9.6* 10.5* 9.4* 9.8*  HCT 27.5*   < > 30.9* 32.0* 35.2* 30.7* 32.2*  MCV 72.8*   < > 79.6 80.6 80.2 78.9 77.8*  PLT 371   < > 201 189 202 135* 140*   < > = values in this interval not displayed.   Basic Metabolic Panel: Recent Labs  Lab 02/17/18 0433 02/18/18 0536 02/19/18 0319 02/20/18 0316 02/21/18 0305  NA 148* 149* 149* 152* 142  K 5.0 3.7 3.5 3.1* 3.5  CL 118* 120* 121* 121* 115*  CO2 19* 21* 23 25 21*  GLUCOSE 68* 153* 111* 103* 139*  BUN 36* 33* 26* 21 18  CREATININE 1.95* 1.89* 1.55* 1.54* 1.38*  CALCIUM 9.4 9.7 9.3 9.6 8.8*  MG 2.1  --   --  1.8 1.6*  PHOS  --  2.6  --  1.8* 2.0*   GFR: Estimated Creatinine Clearance: 18.6 mL/min (A) (by C-G formula based on SCr of 1.38 mg/dL (H)). Liver Function Tests: Recent Labs  Lab 02/15/18 1819 02/17/18 0433 02/20/18 0316 02/21/18 0305  AST 13* 16 20 21   ALT 13  12 13 14   ALKPHOS 119 77 78 75  BILITOT 0.6 1.6* 0.6 0.4  PROT 6.3* 4.9* 5.4* 4.8*  ALBUMIN 2.6* 2.0* 2.1* 1.8*   Recent Labs  Lab 02/15/18 1819  LIPASE 20   No results for input(s): AMMONIA in the last 168 hours. Coagulation Profile: No results for input(s): INR, PROTIME in the last 168 hours. Cardiac Enzymes: No results for input(s): CKTOTAL, CKMB, CKMBINDEX, TROPONINI in the last 168 hours. BNP (last 3 results) No results for input(s): PROBNP in the last 8760 hours. HbA1C: No results for input(s): HGBA1C in the last 72 hours. CBG: Recent Labs  Lab 02/19/18 2153 02/20/18 0634 02/20/18 1328 02/20/18 2250 02/21/18 0556  GLUCAP 88 88 109* 133* 112*   Lipid Profile: Recent Labs    02/20/18 0316  TRIG 101   Thyroid Function Tests: No results for input(s): TSH, T4TOTAL, FREET4, T3FREE, THYROIDAB in the last 72 hours. Anemia Panel: No results for input(s): VITAMINB12, FOLATE, FERRITIN, TIBC, IRON, RETICCTPCT in the last 72  hours. Sepsis Labs: Recent Labs  Lab 02/15/18 1826 02/15/18 2234  LATICACIDVEN 1.10 1.16    Recent Results (from the past 240 hour(s))  Blood culture (routine x 2)     Status: None (Preliminary result)   Collection Time: 02/15/18 10:20 PM  Result Value Ref Range Status   Specimen Description   Final    BLOOD BLOOD RIGHT FOREARM Performed at [facility] [address] [place], [place] [address]    Special Requests   Final    BOTTLES DRAWN AEROBIC AND ANAEROBIC Blood Culture adequate volume Performed at [facility] [address], [place], [place] [address]    Culture   Final    NO GROWTH 4 DAYS Performed at [facility], [place] [address], [place], [place] [address]    Report Status PENDING  Incomplete  Blood culture (routine x 2)     Status: None (Preliminary result)   Collection Time: 02/15/18 10:25 PM  Result Value Ref Range Status   Specimen Description   Final    BLOOD RIGHT ANTECUBITAL Performed at [facility] [address], [place], [place] [address]    Special Requests   Final    BOTTLES DRAWN AEROBIC AND ANAEROBIC Blood Culture adequate volume Performed at [facility] [address] [place], [place] [address]    Culture   Final    NO GROWTH 4 DAYS Performed at [facility], [place] [address], [place], [place] [address]    Report Status PENDING  Incomplete  MRSA PCR Screening     Status: None   Collection Time: 02/18/18 12:50 PM  Result Value Ref Range Status   MRSA by PCR NEGATIVE NEGATIVE Final    Comment:        The GeneXpert MRSA Assay (FDA approved for NASAL specimens only), is one component of a comprehensive MRSA colonization surveillance program. It is not intended to diagnose MRSA infection nor to guide or monitor treatment for MRSA infections. Performed at [facility], [place] [address], [place], [place] [address]   Body fluid culture      Status: None (Preliminary result)   Collection Time: 02/18/18  3:14 PM  Result Value Ref Range Status   Specimen Description   Final    HEMODIALYSIS FISTULA Performed at [facility] [address], [place], [place] [address]    Special Requests   Final    Normal  Performed at [facility], [place] [address], [place], [place] [address]    Gram Stain   Final    ABUNDANT WBC PRESENT, PREDOMINANTLY PMN ABUNDANT GRAM VARIABLE ROD Performed at [facility], [place] [address] [place], [place] [address]    Culture FEW ESCHERICHIA COLI FEW STREPTOCOCCUS GROUP F   Final   Report Status PENDING  Incomplete   Organism ID, Bacteria ESCHERICHIA COLI  Final      Susceptibility   Escherichia coli - MIC*    AMPICILLIN 4 SENSITIVE Sensitive     CEFAZOLIN <=4 SENSITIVE Sensitive     CEFEPIME <=1 SENSITIVE Sensitive     CEFTAZIDIME <=1 SENSITIVE Sensitive     CEFTRIAXONE <=1 SENSITIVE Sensitive     CIPROFLOXACIN <=0.25 SENSITIVE Sensitive     GENTAMICIN <=1 SENSITIVE Sensitive     IMIPENEM <=0.25 SENSITIVE Sensitive     TRIMETH/SULFA <=20 SENSITIVE Sensitive     AMPICILLIN/SULBACTAM <=2 SENSITIVE Sensitive     PIP/TAZO <=4 SENSITIVE Sensitive     Extended ESBL NEGATIVE Sensitive     * FEW ESCHERICHIA COLI       Radiology Studies: Dg Abd 1 View  Result Date: 02/21/2018 CLINICAL DATA:  Abdominal distension EXAM: ABDOMEN - 1 VIEW COMPARISON:  02/19/2018 FINDINGS: Nonobstructive bowel gas pattern. Bilateral pigtail percutaneous nephrostomy catheters. Enteric tube is been removed. Visualized osseous structures are within normal limits. IMPRESSION: Bilateral pigtail percutaneous nephrostomy catheters. Otherwise unremarkable abdominal radiograph. Electronically Signed   By: Julian Hy M.D.   On: 02/21/2018 14:11   Dg Chest Port 1 View  Result Date: 02/21/2018 CLINICAL DATA:  Anemia, shortness of breath EXAM: PORTABLE CHEST 1 VIEW COMPARISON:   02/15/2018 FINDINGS: Mild interstitial edema. Suspected small bilateral pleural effusions, right greater than left. No pneumothorax. The heart is normal in size. Left arm PICC terminates cavoatrial junction. IMPRESSION: Mild interstitial edema with small bilateral pleural effusions, right greater than left, new. Electronically Signed   By: Julian Hy M.D.   On: 02/21/2018 14:09   [place] Ekg Site Rite  Result Date: 02/19/2018 If Site Rite image not attached, placement could not be confirmed due to current cardiac rhythm.     Scheduled Meds: . chlorhexidine  15 mL Mouth Rinse BID  . Chlorhexidine Gluconate Cloth  6 each Topical Daily  . furosemide  40 mg Intravenous Once  . heparin injection (subcutaneous)  5,000 Units Subcutaneous Q8H  . insulin aspart  0-9 Units Subcutaneous 3 times per day  . mouth rinse  15 mL Mouth Rinse q12n4p  . sodium chloride flush  10-40 mL Intracatheter Q12H  . sodium chloride flush  3 mL Intravenous Q12H   Continuous Infusions: . sodium chloride Stopped (02/20/18 1053)  . ampicillin-sulbactam (UNASYN) IV Stopped (02/21/18 0530)  . dextrose 70 mL/hr at 02/20/18 1700  . dextrose    . Marland KitchenTPN (CLINIMIX-E) Adult     And  . Fat emulsion    . TPN (CLINIMIX) Adult without lytes 30 mL/hr at 02/20/18 1740     LOS: 5 days    Time spent: 50 minutes   Dessa Phi, DO [facility] [email or web address] Password [facility] 02/21/2018, 2:27 PM

## 2018-02-21 NOTE — Progress Notes (Signed)
Around 11am this morning pt. Stated she started to feel bad having some shortness of breath. MD paged.  2L O2 was placed on pt. And instructed to use incentive spirometer. Around 1pm pt. was "not feeling well all over", the shortness of breath was better but a decrease in BP. MD paged and came to bedside. Orders for new labs and chest xray /abd xray ordered by MD. Will continue to monitor pt. closely.

## 2018-02-21 NOTE — Progress Notes (Signed)
PHARMACY - ADULT TOTAL PARENTERAL NUTRITION CONSULT NOTE   Pharmacy Consult for TPN Indication: SBO  Patient Measurements: Height: 5\' 4"  (162.6 cm) Weight: 92 lb (41.7 kg) IBW/kg (Calculated) : 54.7 TPN AdjBW (KG): 41.7 Body mass index is 15.79 kg/m. Usual Weight: 45kg  Insulin Requirements: 1 unit novolog in past 24hr  Current Nutrition: NPO  IVF: D5 at 33ml/hr  Central access: Double lumen PICC  TPN start date: 7/16  ASSESSMENT                                                                                                          HPI: 84 yo female admitted 7/11 with worsening abdominal pain and distension since last week.CT abd pelvis w no contrast done on admission revealed SBO and suspected intra abdominal abscess vs necrotic mass. PMH includes endometrial cancer s/p hysterectomy, sigmoidal cancer s/p partial bowel resection, ileostomy and colostomy bag placement, nephrolithiasis s/p bilateral nephrostomy tube placement, hyponatremia, severe malnutrition, and insomnia.  CCS recommends to continue NPO, NGT to suction and begin TPN.  PICC ordered.  Significant events:   Today:    Glucose: No hx DM, CBGs at goal (< 150)  Electrolytes: K at low end of goal 3.5; Na has been elevated but now decreased in normal range;, Phos low at 2.0 despite replacement yesterday; Mag low at 1.6; corrected Ca normal.  Renal: CKD 4 (baseline SCr ~1.5), SCr 1.92 on admit, down 1.38 today. UOP 525 ml/24hr.  LFTs: AST/ALT WNL, Tbili elevated at 1.6 on 7/13 but down to normal today.  TGs: 101 (7/16)  Prealbumin: 10.2 (7/16)  NUTRITIONAL GOALS                                                                                             RD recs on 7/15:  65-80 g/day protein, 1400-1600 kcal/day Clinimix / at a goal rate of 26ml/hr + 20% fat emulsion at 57ml/hr to provide: 72g/day protein, 1502Kcal/day.  PLAN                                                                                                                          Now: Magnesium sulfate 2g  IV x 1; Potassium phosphate 54mmol IV x 1   Increase Clinimix E 5/15 (add electrolytes) to 52ml/hr - slow advancement due to electrolyte abnormalities/refeeding  20% fat emulsion at 73ml/hr x 12 hrs.  Plan to advance as tolerated to the goal rate.  TPN to contain standard multivitamins and trace elements.  TPN to contain famotidine 20mg  (renally adjusted for CrCl < 74ml/min)  Reduce IVF to 77ml/hr.  Continue SSI sensitive scale q8h .   TPN lab panels on Mondays & Thursdays.  F/u daily.  Peggyann Juba, PharmD, BCPS Pager: [phone] 02/21/2018,6:58 AM

## 2018-02-21 NOTE — Consult Note (Signed)
Consultation Note Date: 02/21/2018   Patient Name: [name]  DOB: April 06, 1928  MRN: [id number]  Age / Sex: 84 y.o., female  PCP: Plotnikov, Evie Lacks, MD Referring Physician: Dessa Phi, DO  Reason for Consultation: Establishing goals of care  HPI/Patient Profile: 83 y.o. female admitted on 02/15/2018 from home with concerns of worsening abdominal pain and distention. She noticed decreased colostomy output starting a week prior to admission. She has a past medical history significant for endometrial cancer s/p hysterectomy, sigmoidal cancer s/p partial bowel resention, ileostomy, colostomy, nephrolithiasis s/p bilateral nephrostomy tubes, severe malnutrition, insomnia, hyponatremia, chronic low back pain, and osteoporosis. Patient reported she began having severe abdominal cramps in abdomen with distention, the day of admission she noticed very little output from colostomy, she was initially able to keep food down, but then began experiencing nausea and no appetite. KUB showed small bowel obstruction and NG tube was placed with a significant amount of gastric output. CT abdomen/pelvis showed "Small-bowel obstruction with transition within the pelvis likely secondary to adhesions; Ill-defined complex collection within the pelvis anterior to the rectum and posterior to the bladder may represent an infected collection/abscess or a necrotic mass." Labs showed K 3.4. BUN 40 (from 21 in 2018), Cr 1.92 (from 1.23 in 2018), albumin 2.6, normal lactic acid, wbc 7.2, Hb 7.9 (from 10.7 in 2018). Since admission she has been followed by General Surgery and GYN-Oncology. Palliative Medicine team consulted for goals of care discussion.    Clinical Assessment and Goals of Care: I have reviewed medical records including lab results, imaging, Epic notes, and MAR, received report from the bedside RN, and assessed the patient. I  then met at the bedside with patient and her daughter, [name], to discuss diagnosis prognosis, GOC, EOL wishes, disposition and options. Patient is alert and oriented x3. She is able to engage in goals of care discussion.   I introduced Palliative Medicine as specialized medical care for people living with serious illness. It focuses on providing relief from the symptoms and stress of a serious illness. The goal is to improve quality of life for both the patient and the family.  We discussed a brief life review of the patient. 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 She has 2 surviving children (son and daughter). Her husband of more than 60 years passed away 2 years ago due to CKD and dialysis. She had a younger son who passed away last year with Leukemia. She is of [place].   As far as functional and nutritional status. Patient reports her health has been declining over the past year. She states she has a significant medical history especially with cancer. She reports she lives alone however, her son lives next door and is within 2 min. Walking distance. Prior to admission she was capable of performing all ADLs independently. She did not use any forms of assistive devices with ambulation. She reports a decrease in appetite over the past 3-4 months, however the past 2 weeks has been worst to  the point of not wanting to eat at all.   We discussed her current illness and what it means in the larger context of her on-going co-morbidities.  Natural disease trajectory and expectations at EOL were discussed. Patient and daughter verbalizes the uncertainty of current medical situation. She does report having some drainage for almost a year but never seeking medical attention. She feels she still has much life to go and hopes to be able to regain some strength and return home.   I attempted to elicit values and goals of care important to the patient.    The difference between  aggressive medical intervention and comfort care was considered in light of the patient's goals of care. Patient verbalized that she is not interested in any form of surgical intervention as she is not sure she would survive or survive the recovery process. However, she is in agreement to other medical interventions the medical team would offer. She is concerned about her current weakness and not being able to take in food at this time. She states she is not opposed to having in home health care at discharge but is not very "supportive" of inpatient facilities for rehab.   Advanced directives, concepts specific to code status, artifical feeding and hydration, and rehospitalization were considered and discussed. Patient reports she has a living well and her daughter and son are her documented HCPOA. She has decided that she would not want any forms of life-sustaining measures such as CPR or intubation. She would like to be DNR/DNI. We discussed that nurse will place purple bracelet on her to identify her wishes and so that other staff is aware in an emergent situation. Patient and daughter verbalized understanding.   Hospice and Palliative Care services outpatient were explained and offered. At this time patient and daughter both verbalized they would like to have outpatient Palliative services once she is discharged.   Questions and concerns were addressed.  The family was encouraged to call with questions or concerns.  PMT will continue to support holistically.  Primary Decision Maker: Patient is capable to make health care decisions.    SUMMARY OF RECOMMENDATIONS    DNR/DNI-as requested by patient/daughter  Continue to treat the treatable while hospitalized.   Case Management consult to arrange outpatient palliative at discharge.   Palliative will continue to support patient, family, and medical team during hospitalization.   Code Status/Advance Care Planning:  DNR/DNI-as requested by  patient   Palliative Prophylaxis:   Aspiration, Bowel Regimen, Frequent Pain Assessment, Oral Care and Turn Reposition  Additional Recommendations (Limitations, Scope, Preferences):  Full Scope Treatment-continue to treat the treatable  Psycho-social/Spiritual:   Desire for further Chaplaincy support:NO   Prognosis:   Unable to determine-guarded to poor in the setting of deconditioning, poor po intake, decreased mobility, hx of cancer, colostomy, severe protein-calorie malnutrition, small bowel obstruction, Stage 3 CKD, hypotension, ?f intra-abdominal abscess vs necrotic mass.   Discharge Planning: Outpatient Palliative Services       Primary Diagnoses: Present on Admission: . CANCER, ENDOMETRIUM . FISTULA, ANAL . SBO (small bowel obstruction) ([place]) . Nausea . Abdominal distension . (Resolved) Cholelithiasis . (Resolved) Choledocholithiasis . (Resolved) Cancer of sigmoid colon ([place]) . (Resolved) H/O abdominal hysterectomy . Abnormal CT of the abdomen . Hypokalemia . Hypoalbuminemia . Severe protein-calorie malnutrition ([place]) . Microcytic anemia . Acute renal failure superimposed on stage 3 chronic kidney disease ([place]) . Hypotension . Insomnia . Abdominal pain, generalized   I have reviewed the medical record, interviewed  the patient and family, and examined the patient. The following aspects are pertinent.  Past Medical History:  Diagnosis Date  . Anal fistula   . Anemia   . Arthritis   . Cancer of sigmoid colon ([place]) 02/16/2018  . Choledocholithiasis 02/15/2018  . Cholelithiasis 02/15/2018  . Colon perforation ([place])   . Colon polyp   . Colostomy in place [facility])   . Depression   . Endometrial cancer ([place])   . Fistula    vesicocolonic  . History of colon cancer   . History of nephrolithiasis   . LBP (low back pain)   . Malnutrition ([place])   . Nephrostomy status ([place])    replaced every 6-8 weeks  . Osteoporosis    Pt declined Rx  . 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 Small bowel obstruction ([place])   . Ureteral stenosis   . Vitamin B12 deficiency   . Vitamin D deficiency    Social History   Socioeconomic History  . Marital status: Widowed    Spouse name: Not on file  . Number of children: 3  . Years of education: Not on file  . Highest education level: Not on file  Occupational History  . Occupation: Retired    Fish farm manager: RETIRED  Social Needs  . Financial resource strain: Not on file  . Food insecurity:    Worry: Not on file    Inability: Not on file  . Transportation needs:    Medical: Not on file    Non-medical: Not on file  Tobacco Use  . Smoking status: Current Every Day Smoker    Packs/day: 0.25    Types: Cigarettes  . Smokeless tobacco: Never Used  Substance and Sexual Activity  . Alcohol use: Yes    Comment: 1-2 drinks per week   . Drug use: No  . Sexual activity: Not Currently  Lifestyle  . Physical activity:    Days per week: Not on file    Minutes per session: Not on file  . Stress: Not on file  Relationships  . Social connections:    Talks on phone: Not on file    Gets together: Not on file    Attends religious service: Not on file    Active member of club or organization: Not on file    Attends meetings of clubs or organizations: Not on file    Relationship status: Not on file  Other Topics Concern  . Not on file  Social History Narrative   Regular exercise-no   Family History  Problem Relation Age of Onset  . Heart disease Father   . Hypertension Other   . Kidney disease Other    Scheduled Meds: . chlorhexidine  15 mL Mouth Rinse BID  . Chlorhexidine Gluconate Cloth  6 each Topical Daily  . heparin injection (subcutaneous)  5,000 Units Subcutaneous Q8H  . insulin aspart  0-9 Units Subcutaneous 3 times per day  . mouth rinse  15 mL Mouth Rinse q12n4p  . sodium chloride flush  10-40 mL Intracatheter Q12H  . sodium chloride flush  3 mL Intravenous Q12H   Continuous Infusions: .  sodium chloride Stopped (02/20/18 1053)  . ampicillin-sulbactam (UNASYN) IV 1.5 g (02/21/18 1522)  . dextrose 70 mL/hr at 02/21/18 1445  . dextrose    . Marland KitchenTPN (CLINIMIX-E) Adult     And  . Fat emulsion    . TPN (CLINIMIX) Adult without lytes 30 mL/hr at 02/20/18 1740   PRN Meds:.atropine,  HYDROmorphone (DILAUDID) injection, LORazepam, ondansetron **OR** ondansetron (ZOFRAN) IV, sodium chloride flush Medications Prior to Admission:  Prior to Admission medications   Medication Sig Start Date End Date Taking? Authorizing Provider  Cholecalciferol (VITAMIN D3) 1000 UNITS tablet Take 1,000-2,000 Units by mouth 2 (two) times daily. 1,000 units at lunch and 2,000 units at dinner   Yes [provider]  Cyanocobalamin (VITAMIN B-12 PO) Place 3,000 mcg under the tongue daily.   Yes [provider]  furosemide (LASIX) 20 MG tablet TAKE 1 TABLET (20 MG TOTAL) BY MOUTH DAILY AS NEEDED. FOR SWELLING 12/04/17  Yes Plotnikov, Evie Lacks, MD  LUTEIN PO Take by mouth.   Yes [provider]  mirtazapine (REMERON) 15 MG tablet Take 1 tablet (15 mg total) by mouth at bedtime. 02/14/18  Yes Plotnikov, Evie Lacks, MD  oxyCODONE-acetaminophen (PERCOCET) 10-325 MG tablet Take 1 tablet by mouth every 6 (six) hours as needed for pain. 02/14/18  Yes Plotnikov, Evie Lacks, MD  polyethylene glycol powder (GLYCOLAX/MIRALAX) powder Take 255 g by mouth once. 04/01/15  Yes Plotnikov, Evie Lacks, MD  Probiotic Product (ALIGN) 4 MG CAPS Take 4 mg by mouth daily. For intestinal flora restoration   Yes [provider]  temazepam (RESTORIL) 30 MG capsule TAKE 1 CAPSULE BY MOUTH AT BEDTIME AS NEEDED FOR SLEEP 02/14/18  Yes Plotnikov, Evie Lacks, MD   Allergies  Allergen Reactions  . Ciprofloxacin Other (See Comments)    Stomach   . Flagyl [Metronidazole Hcl]     nausea  . Gabapentin     unknown  . Raloxifene     unknown  . Venlafaxine    Review of Systems  Constitutional: Positive for appetite  change and fatigue.  Neurological: Positive for weakness.  All other systems reviewed and are negative.   Physical Exam  Constitutional: She is oriented to person, place, and time. She is cooperative. She has a sickly appearance.  Thin and frail in appearance   Cardiovascular: Normal rate, regular rhythm, normal heart sounds and normal pulses.  Hypotension   Pulmonary/Chest: She has decreased breath sounds.  Some shortness of breath, 2L/[place]   Abdominal: Normal appearance and bowel sounds are normal. There is tenderness.  Colostomy intact, bilateral nephrostomy tubes in place   Musculoskeletal:  Generalized weakness   Neurological: She is alert and oriented to person, place, and time.  Skin: Skin is warm and dry.  Psychiatric: She has a normal mood and affect. Her speech is normal and behavior is normal. Judgment and thought content normal. Cognition and memory are normal.  Nursing note and vitals reviewed.   Vital Signs: BP 122/71   Pulse (!) 52   Temp 98.7 F (37.1 C) (Oral)   Resp (!) 22   Ht '5\' 4"'$  (1.626 m)   Wt 41.7 kg (92 lb)   SpO2 96%   BMI 15.79 kg/m  Pain Scale: 0-10 POSS *See Group Information*: 1-Acceptable,Awake and alert Pain Score: Asleep   SpO2: SpO2: 96 % O2 Device:SpO2: 96 % O2 Flow Rate: .   IO: Intake/output summary:   Intake/Output Summary (Last 24 hours) at 02/21/2018 1539 Last data filed at 02/21/2018 1522 Gross per 24 hour  Intake 2875.1 ml  Output 1975 ml  Net 900.1 ml    LBM: Last BM Date: 02/20/18 Baseline Weight: Weight: 41.7 kg (92 lb) Most recent weight: Weight: 41.7 kg (92 lb)     Palliative Assessment/Data: PPS 40 % NPO with sips    Time In: 1400 Time Out:  1515 Time Total: 75 min.   Greater than 50%  of this time was spent counseling and coordinating care related to the above assessment and plan.  Signed by:  Alda Lea, NP-BC Palliative Medicine Team  Phone: [phone] Fax: [phone] Pager:  [phone] Amion: Bjorn Pippin    Please contact Palliative Medicine Team phone at [phone] for questions and concerns.  For individual provider: See Shea Evans

## 2018-02-21 NOTE — Progress Notes (Signed)
CRITICAL VALUE ALERT  Critical Value:  Troponin 0.10  Date & Time Notied: 1400h 02/21/2018  Provider Notified: Maylene Roes MD  Orders Received/Actions taken: Re-evaluate troponin this evening.

## 2018-02-21 NOTE — Progress Notes (Signed)
Subjective/Chief Complaint: Stoma has now been putting out stool for the last 2 days, 100cc overnight. Feels fine today, no complaints. NG tube removed yesterday around lunch time. No n/v since. Denies any abdominal pain or distention  Objective: Vital signs in last 24 hours: Temp:  [97.4 F (36.3 C)-98.4 F (36.9 C)] 98.4 F (36.9 C) (07/17 0800) Pulse Rate:  [43-67] 67 (07/17 0800) Resp:  [21-44] 23 (07/17 0800) BP: (91-132)/(46-89) 104/50 (07/17 0200) SpO2:  [87 %-97 %] 95 % (07/17 0800) Last BM Date: 02/20/18  Intake/Output from previous day: 07/16 0701 - 07/17 0700 In: 3452.5 [I.V.:2472.8; IV Piggyback:979.7] Out: 625 [Urine:525; Stool:100] Intake/Output this shift: No intake/output data recorded.  Resp: clear to auscultation bilaterally Cardio: bradycardia, rr GI: Soft, nontender, not distended; ostomy appliance with liquid stool in bag   Lab Results:  Recent Labs    02/20/18 0316 02/21/18 0305  WBC 7.0 7.0  HGB 10.5* 9.4*  HCT 35.2* 30.7*  PLT 202 135*   BMET Recent Labs    02/20/18 0316 02/21/18 0305  NA 152* 142  K 3.1* 3.5  CL 121* 115*  CO2 25 21*  GLUCOSE 103* 139*  BUN 21 18  CREATININE 1.54* 1.38*  CALCIUM 9.6 8.8*   PT/INR No results for input(s): LABPROT, INR in the last 72 hours. ABG No results for input(s): PHART, HCO3 in the last 72 hours.  Invalid input(s): PCO2, PO2  Studies/Results: [place] Ekg Site Rite  Result Date: 02/19/2018 If Site Rite image not attached, placement could not be confirmed due to current cardiac rhythm.   Anti-infectives: Anti-infectives (From admission, onward)   Start     Dose/Rate Route Frequency Ordered Stop   02/20/18 1600  ampicillin-sulbactam (UNASYN) 1.5 g in sodium chloride 0.9 % 100 mL IVPB     1.5 g 200 mL/hr over 30 Minutes Intravenous Every 12 hours 02/20/18 1134 02/22/18 1559   02/16/18 0400  piperacillin-tazobactam (ZOSYN) IVPB 2.25 g  Status:  Discontinued     2.25 g 100 mL/hr over 30 Minutes  Intravenous Every 6 hours 02/16/18 0009 02/20/18 1134   02/15/18 2215  piperacillin-tazobactam (ZOSYN) IVPB 3.375 g     3.375 g 100 mL/hr over 30 Minutes Intravenous  Once 02/15/18 2202 02/15/18 2333      Assessment/Plan: SBO- multiple past abdominal surgeries, likely due to adhesive disease - CT 7/11: sbo with transition within the pelvis, likely adhesions with ?mass in pelvis - on gyn/onc exam - found large cavity in the pelvis with open vaginal cuff - presumably from prior radiation?  -xray looks normal; stoma with output; doing fine without NG; will start sips of liquids today  -Needs to be out of bed, in chair, ambulating with assistance  Intra-abdominal abscess vs necrotic mass - additional pelvic imaging if fails to resolve - CT with contrast  - CT 7/11: ill-defined collection within pelvis, anterior to rectum and posterior to bladder. Possible fistulous tract extending to collection - WBC 6.6 and patient afebrile  Would start pharm dvt proph no contraindication from our standpoint would not even hold if needs surgery given high risk nature  Ileana Roup 02/21/2018

## 2018-02-21 NOTE — Progress Notes (Signed)
Nutrition Follow-up  DOCUMENTATION CODES:   Underweight, Severe malnutrition in context of chronic illness  INTERVENTION:  - Continue TPN per Pharmacy; slow advancement given likely refeeding/refeeding risk. - Monitor magnesium, potassium, and phosphorus daily for at least 3 days, MD to replete as needed, as pt is at risk for refeeding syndrome given severe malnutrition, current hypophosphatemia and slight hypomagnesemia.   NUTRITION DIAGNOSIS:   Severe Malnutrition related to chronic illness, poor appetite(history of endometrial and sigmoidal cancer, colostomy) as evidenced by severe fat depletion, severe muscle depletion, moderate muscle depletion. -ongoing  GOAL:   Patient will meet greater than or equal to 90% of their needs -unmet/unable to meet with current TPN rate.   MONITOR:   Weight trends, Labs, Skin, I & O's, Other (Comment)(TPN regimen)  ASSESSMENT:   84 year old female who presented to ED with colostomy blockage. PMH significant for multiple intra-abdominal surgeries and colon perforation, anal fistula, anemia, depression, nephrolithiasis s/p bilateral nephrostomy tube placement, endometrial cancer s/p hysterectomy, and sigmoidal cancer s/p partial bowel resection.  No new weight since 7/14. Patient remains NPO and NGT was removed yesterday at 2:45 PM. She has a double lumen PICC in L brachial (placed 7/15). TPN being slowly advanced d/t refeeding/refeeding risk.   Per Pharmacy note this AM: plan to increase TPN to Clinimix E 5/15 @ 40 mL/hr with 20% ILE @ 10 mL/hr x12 hours/day tonight at 6:00 PM. This regimen will provide 922 kcal and 48 grams of protein. Goal for TPN: Clinimix E 5/15 @ 60 mL/hr with 20% ILE @ 20 mL/hr x12 hours/day which will provide 1502 kcal and 72 grams of protein.      Medications reviewed; sliding scale Novolog, 2 g IV Mg sulfate x1 run today, 10 mEq IV KCl x4 runs yesterday, 20 mmol IV KPhos x1 run yesterday, 30 mmol IV KPhos x1 run  today. Labs reviewed; Cl: 115 mmol/L, creatinine: 1.38 mg/dL, Ca: 8.8 mg/dL, Phos: 2 mg/dL, Mg: 1.6 mg/dL, GFR: 33 mL/min.   IVF: D5 @ 70 mL/hr to decrease to 60 mL/hr tonight at 6:00 PM (which will provide 245 kcal).    Diet Order:   Diet Order           Diet NPO time specified Except for: Ice Chips  Diet effective now          EDUCATION NEEDS:   No education needs have been identified at this time  Skin:  Skin Assessment: Skin Integrity Issues: Skin Integrity Issues:: Stage II Stage II: sacrum  Last BM:  7/16  Height:   Ht Readings from Last 1 Encounters:  02/18/18 5\' 4"  (1.626 m)    Weight:   Wt Readings from Last 1 Encounters:  02/18/18 92 lb (41.7 kg)    Ideal Body Weight:  56.82 kg  BMI:  Body mass index is 15.79 kg/m.  Estimated Nutritional Needs:   Kcal:  1400-1600 kcal/day  Protein:  65-80 grams/day  Fluid:  1.4-1.6 L/day     Jarome Matin, MS, RD, LDN, [facility] Inpatient Clinical Dietitian Pager # [phone] After hours/weekend pager # [phone]

## 2018-02-22 ENCOUNTER — Inpatient Hospital Stay (HOSPITAL_COMMUNITY): Payer: Medicare HMO

## 2018-02-22 ENCOUNTER — Encounter (HOSPITAL_COMMUNITY): Payer: Self-pay | Admitting: Radiology

## 2018-02-22 LAB — COMPREHENSIVE METABOLIC PANEL
ALT: 14 U/L (ref 0–44)
AST: 17 U/L (ref 15–41)
Albumin: 1.9 g/dL — ABNORMAL LOW (ref 3.5–5.0)
Alkaline Phosphatase: 76 U/L (ref 38–126)
Anion gap: 8 (ref 5–15)
BUN: 20 mg/dL (ref 8–23)
CALCIUM: 8.4 mg/dL — AB (ref 8.9–10.3)
CHLORIDE: 106 mmol/L (ref 98–111)
CO2: 23 mmol/L (ref 22–32)
Creatinine, Ser: 1.33 mg/dL — ABNORMAL HIGH (ref 0.44–1.00)
GFR, EST AFRICAN AMERICAN: 40 mL/min — AB (ref >60)
GFR, EST NON AFRICAN AMERICAN: 35 mL/min — AB (ref >60)
Glucose, Bld: 122 mg/dL — ABNORMAL HIGH (ref 70–99)
POTASSIUM: 3 mmol/L — AB (ref 3.5–5.1)
SODIUM: 137 mmol/L (ref 135–145)
Total Bilirubin: 0.4 mg/dL (ref 0.3–1.2)
Total Protein: 5 g/dL — ABNORMAL LOW (ref 6.5–8.1)

## 2018-02-22 LAB — GLUCOSE, CAPILLARY
GLUCOSE-CAPILLARY: 127 mg/dL — AB (ref 70–99)
Glucose-Capillary: 99 mg/dL (ref 70–99)

## 2018-02-22 LAB — MAGNESIUM: MAGNESIUM: 1.8 mg/dL (ref 1.7–2.4)

## 2018-02-22 LAB — BODY FLUID CULTURE: SPECIAL REQUESTS: NORMAL

## 2018-02-22 LAB — PHOSPHORUS: PHOSPHORUS: 2.9 mg/dL (ref 2.5–4.6)

## 2018-02-22 LAB — CBC
HCT: 32.1 % — ABNORMAL LOW (ref 36.0–46.0)
HEMOGLOBIN: 10.1 g/dL — AB (ref 12.0–15.0)
MCH: 24.4 pg — ABNORMAL LOW (ref 26.0–34.0)
MCHC: 31.5 g/dL (ref 30.0–36.0)
MCV: 77.5 fL — ABNORMAL LOW (ref 78.0–100.0)
Platelets: 159 10*3/uL (ref 150–400)
RBC: 4.14 MIL/uL (ref 3.87–5.11)
RDW: 22 % — AB (ref 11.5–15.5)
WBC: 7.9 10*3/uL (ref 4.0–10.5)

## 2018-02-22 MED ORDER — MAGNESIUM SULFATE IN D5W 1-5 GM/100ML-% IV SOLN
1.0000 g | Freq: Once | INTRAVENOUS | Status: AC
  Administered 2018-02-22: 1 g via INTRAVENOUS
  Filled 2018-02-22: qty 100

## 2018-02-22 MED ORDER — POTASSIUM CHLORIDE 10 MEQ/100ML IV SOLN
10.0000 meq | INTRAVENOUS | Status: AC
Start: 2018-02-22 — End: 2018-02-22
  Administered 2018-02-22 (×6): 10 meq via INTRAVENOUS
  Filled 2018-02-22 (×5): qty 100

## 2018-02-22 MED ORDER — IOHEXOL 300 MG/ML  SOLN
75.0000 mL | Freq: Once | INTRAMUSCULAR | Status: AC | PRN
  Administered 2018-02-22: 75 mL via INTRAVENOUS

## 2018-02-22 MED ORDER — TRACE MINERALS CR-CU-MN-SE-ZN 10-1000-500-60 MCG/ML IV SOLN
INTRAVENOUS | Status: AC
  Administered 2018-02-22: 18:00:00 via INTRAVENOUS
  Filled 2018-02-22 (×2): qty 960

## 2018-02-22 MED ORDER — IOPAMIDOL (ISOVUE-300) INJECTION 61%
INTRAVENOUS | Status: AC
Start: 2018-02-22 — End: 2018-02-23
  Filled 2018-02-22: qty 30

## 2018-02-22 MED ORDER — FAT EMULSION PLANT BASED 20 % IV EMUL
120.0000 mL | INTRAVENOUS | Status: AC
  Administered 2018-02-22: 120 mL via INTRAVENOUS
  Filled 2018-02-22: qty 250

## 2018-02-22 MED ORDER — IOPAMIDOL (ISOVUE-300) INJECTION 61%
30.0000 mL | Freq: Once | INTRAVENOUS | Status: AC | PRN
  Administered 2018-02-22: 30 mL via ORAL

## 2018-02-22 NOTE — Progress Notes (Signed)
PHARMACY - ADULT TOTAL PARENTERAL NUTRITION CONSULT NOTE   Pharmacy Consult for TPN Indication: SBO  Patient Measurements: Height: 5\' 4"  (162.6 cm) Weight: 92 lb (41.7 kg) IBW/kg (Calculated) : 54.7 TPN AdjBW (KG): 41.7 Body mass index is 15.79 kg/m. Usual Weight: 45kg  Insulin Requirements: 3 unit novolog in past 24hr  Current Nutrition: NPO  IVF: D5 at 9ml/hr  Central access: Double lumen PICC  TPN start date: 7/16  ASSESSMENT                                                                                                          HPI: 84 yo female admitted 7/11 with worsening abdominal pain and distension since last week.CT abd pelvis w no contrast done on admission revealed SBO and suspected intra abdominal abscess vs necrotic mass. PMH includes endometrial cancer s/p hysterectomy, sigmoidal cancer s/p partial bowel resection, ileostomy and colostomy bag placement, nephrolithiasis s/p bilateral nephrostomy tube placement, hyponatremia, severe malnutrition, and insomnia.  CCS recommends to continue NPO, NGT to suction and begin TPN.  PICC ordered.  Significant events:   Today:    Glucose: No hx DM, CBGs at goal (< 150)  Electrolytes: K low at 3.0; Na has been elevated but now decreased in normal range;, Phos 2.9, WNL; Mag  1.8; corrected Ca normal.  Renal: CKD 4 (baseline SCr ~1.5), SCr 1.92 on admit, down 1.33 today. UOP 4100 ml/24hr.  LFTs: AST/ALT WNL, Tbili elevated at 1.6 on 7/13 but down to normal today.  TGs: 101 (7/16)  Prealbumin: 10.2 (7/16)  NUTRITIONAL GOALS                                                                                             RD recs on 7/15:  65-80 g/day protein, 1400-1600 kcal/day Clinimix / at a goal rate of 40ml/hr + 20% fat emulsion at 68ml/hr to provide: 72g/day protein, 1502Kcal/day.  PLAN                                                                                                                         Now: Magnesium  sulfate 1g IV x 1; Potassium chloride 10 meq IV  x 6 ( already ordered by MD)    Continue Clinimix E 5/15 at 38ml/hr - slow advancement due to electrolyte abnormalities/refeeding  20% fat emulsion at 65ml/hr x 12 hrs.  Plan to advance as tolerated to the goal rate.  TPN to contain standard multivitamins and trace elements.  TPN to contain famotidine 20mg  (renally adjusted for CrCl < 44ml/min)  Continue IVF to 32ml/hr.  Continue SSI sensitive scale q8h .   TPN lab panels on Mondays & Thursdays.  BMP, magnesium, phosphorus with AM labs   F/u daily.   Royetta Asal, PharmD, BCPS Pager [phone] 02/22/2018 9:40 AM

## 2018-02-22 NOTE — Progress Notes (Signed)
Subjective/Chief Complaint: Stoma has now been putting out stool for the last 3 days, 400-500cc over last 24hrs; lots of flatus per patient. Reports feeling well; had 3 cups of liquids yesterday without n/v. Denies any abdominal pain or distention  Objective: Vital signs in last 24 hours: Temp:  [98.4 F (36.9 C)-98.7 F (37.1 C)] 98.5 F (36.9 C) (07/18 0800) Pulse Rate:  [52-65] 57 (07/18 0600) Resp:  [17-34] 20 (07/18 0600) BP: (72-147)/(32-97) 115/47 (07/18 0600) SpO2:  [90 %-98 %] 95 % (07/18 0600) Last BM Date: 02/21/18  Intake/Output from previous day: 07/17 0701 - 07/18 0700 In: 1615.3 [P.O.:240; I.V.:1375.3] Out: 4400 [Urine:4100; Stool:300] Intake/Output this shift: No intake/output data recorded.  Resp: clear to auscultation bilaterally Cardio: bradycardia, rr GI: Soft, nontender, not distended; ostomy appliance with liquid stool + gas in bag   Lab Results:  Recent Labs    02/21/18 1352 02/22/18 0422  WBC 6.9 7.9  HGB 9.8* 10.1*  HCT 32.2* 32.1*  PLT 140* 159   BMET Recent Labs    02/21/18 1352 02/22/18 0422  NA 139 137  K 3.9 3.0*  CL 111 106  CO2 21* 23  GLUCOSE 159* 122*  BUN 19 20  CREATININE 1.33* 1.33*  CALCIUM 8.6* 8.4*   PT/INR No results for input(s): LABPROT, INR in the last 72 hours. ABG No results for input(s): PHART, HCO3 in the last 72 hours.  Invalid input(s): PCO2, PO2  Studies/Results: Dg Abd 1 View  Result Date: 02/21/2018 CLINICAL DATA:  Abdominal distension EXAM: ABDOMEN - 1 VIEW COMPARISON:  02/19/2018 FINDINGS: Nonobstructive bowel gas pattern. Bilateral pigtail percutaneous nephrostomy catheters. Enteric tube is been removed. Visualized osseous structures are within normal limits. IMPRESSION: Bilateral pigtail percutaneous nephrostomy catheters. Otherwise unremarkable abdominal radiograph. Electronically Signed   By: Julian Hy M.D.   On: 02/21/2018 14:11   Dg Chest Port 1 View  Result Date: 02/21/2018 CLINICAL  DATA:  Anemia, shortness of breath EXAM: PORTABLE CHEST 1 VIEW COMPARISON:  02/15/2018 FINDINGS: Mild interstitial edema. Suspected small bilateral pleural effusions, right greater than left. No pneumothorax. The heart is normal in size. Left arm PICC terminates cavoatrial junction. IMPRESSION: Mild interstitial edema with small bilateral pleural effusions, right greater than left, new. Electronically Signed   By: Julian Hy M.D.   On: 02/21/2018 14:09    Anti-infectives: Anti-infectives (From admission, onward)   Start     Dose/Rate Route Frequency Ordered Stop   02/20/18 1600  ampicillin-sulbactam (UNASYN) 1.5 g in sodium chloride 0.9 % 100 mL IVPB     1.5 g 200 mL/hr over 30 Minutes Intravenous Every 12 hours 02/20/18 1134 02/22/18 0531   02/16/18 0400  piperacillin-tazobactam (ZOSYN) IVPB 2.25 g  Status:  Discontinued     2.25 g 100 mL/hr over 30 Minutes Intravenous Every 6 hours 02/16/18 0009 02/20/18 1134   02/15/18 2215  piperacillin-tazobactam (ZOSYN) IVPB 3.375 g     3.375 g 100 mL/hr over 30 Minutes Intravenous  Once 02/15/18 2202 02/15/18 2333      Assessment/Plan: SBO- multiple past abdominal surgeries, likely due to adhesive disease - resolving  - CT 7/11: sbo with transition within the pelvis, likely adhesions with ?mass in pelvis - on gyn/onc exam - found large cavity in the pelvis with open vaginal cuff - presumably from prior radiation?  -xray normal; stoma with output; continue clears this morning, if continues to tolerate can advance to full liquids later today. Continue TPN until reliably tolerating regular diet for 24hrs  -  Avoid benzodiazepines  -Needs to be out of bed, in chair, ambulating with assistance  Intra-abdominal abscess vs necrotic mass - additional pelvic imaging if fails to resolve - CT with contrast   - CT 7/11: ill-defined collection within pelvis, anterior to rectum and posterior to bladder. Possible fistulous tract extending to  collection - WBC 6.6 and patient afebrile  -PPx: SQH, SCDs  Ileana Roup 02/22/2018

## 2018-02-22 NOTE — Progress Notes (Signed)
PROGRESS NOTE    [name]  XFG:[id number] DOB: 1928-02-14 DOA: 02/15/2018 PCP: Cassandria Anger, MD     Brief Narrative:  [name] is a 84 y.o.femalewith medical history significant ofendometrial cancer s/p hysterectomy and radiation more than 30 years ago, sigmoidal cancer s/p partial bowel resection, ileostomy and colostomy bag placement, nephrolithiasis s/p bilateral nephrostomy tube placement, severe malnutrition, and insomnia, presented with complaints of worsening abdominal pain and distension of one week duration.CT abd pelvis w no contrast done on admission revealed SBO and suspected intra abdominal abscess vs necrotic mass. Choledocholithiasis and cholelithiasis with normal LFTs.  Admitted for small bowel obstruction. General surgery consulted and following.  Hospitalization also complicated by sinus bradycardia and severe malnutrition.  Cardiology was consulted who recommended treating malnutrition to improve sinus bradycardia.  New events last 24 hours / Subjective: Doing much better this morning. She received IV lasix yesterday afternoon and has put out 4.4L last 24 hours. Breathing much better, less anxious. She has no complaints of nausea, vomiting today and has been tolerating sips.   Assessment & Plan:   Principal Problem:   SBO (small bowel obstruction) (HCC) Active Problems:   CANCER, ENDOMETRIUM   FISTULA, ANAL   Abdominal pain, generalized   Insomnia   Colostomy in place (HCC)   Nausea   Abdominal distension   Abnormal CT of the abdomen   Hypokalemia   Hypoalbuminemia   Severe protein-calorie malnutrition (HCC)   Microcytic anemia   Acute renal failure superimposed on stage 3 chronic kidney disease (HCC)   Hypotension   Small bowel obstruction, suspect secondary to adhesions from multiple abdominal surgeries -General surgery following -NGT removed -Clear liquid diet, can advance as tolerated to full liquid   Intra-abdominal abscess  versus necrotic mass/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  -Noted on CT abdomen and pelvis with no contrast done on admission -Appreciate gynecology oncology input -?CT if fails to resolve, defer to general surgery  -Body fluid culture 7/14 revealed Coli  -Continue IV antibiotics, switched from zosyn to IV unasyn on 02/20/18  Dyspnea -CXR independently reviewed, showed bilateral interstitial edema with small bilateral pleural effusions. Improved with IV lasix   Asymptomatic sinus bradycardia  -Cardiology consulted, suspects 2/2 to severe malnutrition -Improving. Normal sinus rate 70s this morning   Severe protein calorie malnutrition -TPN per pharmacy   CKD 4 -Baseline Cr appears to be 1.5 -Stable  Hx of endometrial cancer s/p radiation  -Radiation done more than 30 years ago  Hx of colon cancer s/p resection and colostomy  Hx of nephrolithiasis s/p nephrostomy tube placement  Chronic anemia/iron deficiency anemia -FOBT negative -Status post 2 units PRBC transfusion -Hemoglobin 6.1 on presentation -Transfused 2 units PRBC on 02/16/2018 -Hg stable  Cholelithiasis/choledocholithiasis -No sign of obstructive jaundice or transaminitis -GI will follow outpatient  Hypokalemia, hypomagnesemia -Replace, trend    DVT prophylaxis: Subq hep  Code Status: Full Family Communication: No family at bedside Disposition Plan: Pending clinical improvement, PT OT eval    Consultants:   General surgery  Cardiology  Radiation oncology  Gyn oncology  Palliative care team   Procedures:   PICC 7/15   Antimicrobials:  Anti-infectives (From admission, onward)   Start     Dose/Rate Route Frequency Ordered Stop   02/20/18 1600  ampicillin-sulbactam (UNASYN) 1.5 g in sodium chloride 0.9 % 100 mL IVPB     1.5 g 200 mL/hr over 30 Minutes Intravenous Every 12 hours 02/20/18 1134 02/22/18 0531   02/16/18 0400  piperacillin-tazobactam (ZOSYN)  IVPB 2.25 g  Status:  Discontinued      2.25 g 100 mL/hr over 30 Minutes Intravenous Every 6 hours 02/16/18 0009 02/20/18 1134   02/15/18 2215  piperacillin-tazobactam (ZOSYN) IVPB 3.375 g     3.375 g 100 mL/hr over 30 Minutes Intravenous  Once 02/15/18 2202 02/15/18 2333       Objective: Vitals:   02/22/18 0000 02/22/18 0323 02/22/18 0600 02/22/18 0800  BP: (!) 118/52  (!) 115/47   Pulse: (!) 55  (!) 57   Resp: (!) 21  20   Temp:  98.4 F (36.9 C)  98.5 F (36.9 C)  TempSrc:  Axillary  Oral  SpO2: 96%  95%   Weight:      Height:        Intake/Output Summary (Last 24 hours) at 02/22/2018 0902 Last data filed at 02/22/2018 0600 Gross per 24 hour  Intake 1615.34 ml  Output 4400 ml  Net -2784.66 ml   Filed Weights   02/15/18 1740 02/16/18 0942 02/18/18 1130  Weight: 41.7 kg (92 lb) 40 kg (88 lb 2.9 oz) 41.7 kg (92 lb)    Examination: General exam: Appears calm and comfortable  Respiratory system: Clear to auscultation. Respiratory effort normal. On [place] O2  Cardiovascular system: S1 & S2 heard, RRR rate 70s. No JVD, murmurs, rubs, gallops or clicks. No pedal edema. Gastrointestinal system: Abdomen is nondistended, soft and nontender. No organomegaly or masses felt. Normal bowel sounds heard. +stoma  GU: bilateral nephrostomy tubes in place  Central nervous system: Alert and oriented. No focal neurological deficits. Extremities: Symmetric 5 x 5 power. Skin: No rashes, lesions or ulcers Psychiatry: Judgement and insight appear normal. Mood & affect appropriate.    Data Reviewed: I have personally reviewed following labs and imaging studies  CBC: Recent Labs  Lab 02/15/18 1819  02/18/18 0536 02/20/18 0316 02/21/18 0305 02/21/18 1352 02/22/18 0422  WBC 7.2   < > 6.6 7.0 7.0 6.9 7.9  NEUTROABS 6.0  --   --  5.6  --   --   --   HGB 7.9*   < > 9.6* 10.5* 9.4* 9.8* 10.1*  HCT 27.5*   < > 32.0* 35.2* 30.7* 32.2* 32.1*  MCV 72.8*   < > 80.6 80.2 78.9 77.8* 77.5*  PLT 371   < > 189 202 135* 140* 159   < >  = values in this interval not displayed.   Basic Metabolic Panel: Recent Labs  Lab 02/17/18 0433 02/18/18 0536 02/19/18 0319 02/20/18 0316 02/21/18 0305 02/21/18 1352 02/22/18 0422  NA 148* 149* 149* 152* 142 139 137  K 5.0 3.7 3.5 3.1* 3.5 3.9 3.0*  CL 118* 120* 121* 121* 115* 111 106  CO2 19* 21* 23 25 21* 21* 23  GLUCOSE 68* 153* 111* 103* 139* 159* 122*  BUN 36* 33* 26* 21 18 19 20   CREATININE 1.95* 1.89* 1.55* 1.54* 1.38* 1.33* 1.33*  CALCIUM 9.4 9.7 9.3 9.6 8.8* 8.6* 8.4*  MG 2.1  --   --  1.8 1.6* 2.0 1.8  PHOS  --  2.6  --  1.8* 2.0* 4.1 2.9   GFR: Estimated Creatinine Clearance: 19.2 mL/min (A) (by C-G formula based on SCr of 1.33 mg/dL (H)). Liver Function Tests: Recent Labs  Lab 02/17/18 0433 02/20/18 0316 02/21/18 0305 02/21/18 1352 02/22/18 0422  AST 16 20 21 18 17   ALT 12 13 14 15 14   ALKPHOS 77 78 75 79 76  BILITOT 1.6* 0.6 0.4  0.4 0.4  PROT 4.9* 5.4* 4.8* 5.2* 5.0*  ALBUMIN 2.0* 2.1* 1.8* 2.0* 1.9*   Recent Labs  Lab 02/15/18 1819  LIPASE 20   No results for input(s): AMMONIA in the last 168 hours. Coagulation Profile: No results for input(s): INR, PROTIME in the last 168 hours. Cardiac Enzymes: Recent Labs  Lab 02/21/18 1352 02/21/18 2007  TROPONINI 0.10* 0.10*   BNP (last 3 results) No results for input(s): PROBNP in the last 8760 hours. HbA1C: No results for input(s): HGBA1C in the last 72 hours. CBG: Recent Labs  Lab 02/20/18 1328 02/20/18 2250 02/21/18 0556 02/21/18 1435 02/21/18 2354  GLUCAP 109* 133* 112* 139* 144*   Lipid Profile: Recent Labs    02/20/18 0316  TRIG 101   Thyroid Function Tests: No results for input(s): TSH, T4TOTAL, FREET4, T3FREE, THYROIDAB in the last 72 hours. Anemia Panel: No results for input(s): VITAMINB12, FOLATE, FERRITIN, TIBC, IRON, RETICCTPCT in the last 72 hours. Sepsis Labs: Recent Labs  Lab 02/15/18 1826 02/15/18 2234 02/21/18 1352  LATICACIDVEN 1.10 1.16 1.7    Recent  Results (from the past 240 hour(s))  Blood culture (routine x 2)     Status: None   Collection Time: 02/15/18 10:20 PM  Result Value Ref Range Status   Specimen Description   Final    BLOOD BLOOD RIGHT FOREARM Performed at [facility] [address] [place], [place] [address]    Special Requests   Final    BOTTLES DRAWN AEROBIC AND ANAEROBIC Blood Culture adequate volume Performed at [facility] [address] [place], [place] [address]    Culture   Final    NO GROWTH 5 DAYS Performed at [facility], [place] [address], [place], [place] [address]    Report Status 02/21/2018 FINAL  Final  Blood culture (routine x 2)     Status: None   Collection Time: 02/15/18 10:25 PM  Result Value Ref Range Status   Specimen Description   Final    BLOOD RIGHT ANTECUBITAL Performed at [facility] [address] [place], [place] [address]    Special Requests   Final    BOTTLES DRAWN AEROBIC AND ANAEROBIC Blood Culture adequate volume Performed at [facility] [address], [place], [place] [address]    Culture   Final    NO GROWTH 5 DAYS Performed at [facility], [place] [address] [place], [place] [address]    Report Status 02/21/2018 FINAL  Final  MRSA PCR Screening     Status: None   Collection Time: 02/18/18 12:50 PM  Result Value Ref Range Status   MRSA by PCR NEGATIVE NEGATIVE Final    Comment:        The GeneXpert MRSA Assay (FDA approved for NASAL specimens only), is one component of a comprehensive MRSA colonization surveillance program. It is not intended to diagnose MRSA infection nor to guide or monitor treatment for MRSA infections. Performed at [facility], [place] [address] [place], [place] [address]   Body fluid culture     Status: None   Collection Time: 02/18/18  3:14 PM  Result Value Ref Range Status   Specimen Description   Final    HEMODIALYSIS  FISTULA Performed at [facility], [place] [address] [place], [place] [address]    Special Requests   Final    Normal Performed at [facility], [place] [address] [place],  [place] [address]    Gram Stain   Final    ABUNDANT WBC PRESENT, PREDOMINANTLY PMN ABUNDANT GRAM VARIABLE ROD Performed at [facility], [place] [address], [place], [place] [address]    Culture FEW ESCHERICHIA COLI FEW STREPTOCOCCUS GROUP F   Final   Report Status 02/22/2018 FINAL  Final   Organism ID, Bacteria ESCHERICHIA COLI  Final      Susceptibility   Escherichia coli - MIC*    AMPICILLIN 4 SENSITIVE Sensitive     CEFAZOLIN <=4 SENSITIVE Sensitive     CEFEPIME <=1 SENSITIVE Sensitive     CEFTAZIDIME <=1 SENSITIVE Sensitive     CEFTRIAXONE <=1 SENSITIVE Sensitive     CIPROFLOXACIN <=0.25 SENSITIVE Sensitive     GENTAMICIN <=1 SENSITIVE Sensitive     IMIPENEM <=0.25 SENSITIVE Sensitive     TRIMETH/SULFA <=20 SENSITIVE Sensitive     AMPICILLIN/SULBACTAM <=2 SENSITIVE Sensitive     PIP/TAZO <=4 SENSITIVE Sensitive     Extended ESBL NEGATIVE Sensitive     * FEW ESCHERICHIA COLI       Radiology Studies: Dg Abd 1 View  Result Date: 02/21/2018 CLINICAL DATA:  Abdominal distension EXAM: ABDOMEN - 1 VIEW COMPARISON:  02/19/2018 FINDINGS: Nonobstructive bowel gas pattern. Bilateral pigtail percutaneous nephrostomy catheters. Enteric tube is been removed. Visualized osseous structures are within normal limits. IMPRESSION: Bilateral pigtail percutaneous nephrostomy catheters. Otherwise unremarkable abdominal radiograph. Electronically Signed   By: Julian Hy M.D.   On: 02/21/2018 14:11   Dg Chest Port 1 View  Result Date: 02/21/2018 CLINICAL DATA:  Anemia, shortness of breath EXAM: PORTABLE CHEST 1 VIEW COMPARISON:  02/15/2018 FINDINGS: Mild interstitial edema. Suspected small bilateral pleural effusions, right greater than left. No pneumothorax. The heart is normal in  size. Left arm PICC terminates cavoatrial junction. IMPRESSION: Mild interstitial edema with small bilateral pleural effusions, right greater than left, new. Electronically Signed   By: Julian Hy M.D.   On: 02/21/2018 14:09      Scheduled Meds: . chlorhexidine  15 mL Mouth Rinse BID  . Chlorhexidine Gluconate Cloth  6 each Topical Daily  . heparin injection (subcutaneous)  5,000 Units Subcutaneous Q8H  . insulin aspart  0-9 Units Subcutaneous 3 times per day  . mouth rinse  15 mL Mouth Rinse q12n4p  . sodium chloride flush  10-40 mL Intracatheter Q12H  . sodium chloride flush  3 mL Intravenous Q12H   Continuous Infusions: . sodium chloride Stopped (02/20/18 1053)  . dextrose 60 mL/hr at 02/22/18 0600  . magnesium sulfate 1 - 4 g bolus IVPB    . potassium chloride    . Marland KitchenTPN (CLINIMIX-E) Adult 40 mL/hr at 02/22/18 0600     LOS: 6 days    Time spent: 25 minutes   Dessa Phi, DO [facility] [email or web address] Password [email or web address] 02/22/2018, 9:02 AM

## 2018-02-22 NOTE — Progress Notes (Addendum)
Heart rates have improved   Will sign off  Call with questions.    Dorris Carnes   [facility] will sign off.   Medication Recommendations:  Continue current medications Other recommendations (labs, testing, etc):  None Follow up as an outpatient: with PCP. Cardiology follow up with Dr. Harrington Challenger PRN

## 2018-02-23 LAB — GLUCOSE, CAPILLARY
GLUCOSE-CAPILLARY: 103 mg/dL — AB (ref 70–99)
GLUCOSE-CAPILLARY: 110 mg/dL — AB (ref 70–99)
Glucose-Capillary: 101 mg/dL — ABNORMAL HIGH (ref 70–99)

## 2018-02-23 LAB — BASIC METABOLIC PANEL
Anion gap: 4 — ABNORMAL LOW (ref 5–15)
BUN: 20 mg/dL (ref 8–23)
CALCIUM: 8.5 mg/dL — AB (ref 8.9–10.3)
CO2: 24 mmol/L (ref 22–32)
Chloride: 104 mmol/L (ref 98–111)
Creatinine, Ser: 1.29 mg/dL — ABNORMAL HIGH (ref 0.44–1.00)
GFR calc Af Amer: 42 mL/min — ABNORMAL LOW (ref >60)
GFR, EST NON AFRICAN AMERICAN: 36 mL/min — AB (ref >60)
GLUCOSE: 104 mg/dL — AB (ref 70–99)
Potassium: 3.9 mmol/L (ref 3.5–5.1)
Sodium: 132 mmol/L — ABNORMAL LOW (ref 135–145)

## 2018-02-23 LAB — PHOSPHORUS: Phosphorus: 2.4 mg/dL — ABNORMAL LOW (ref 2.5–4.6)

## 2018-02-23 LAB — MAGNESIUM: Magnesium: 1.8 mg/dL (ref 1.7–2.4)

## 2018-02-23 MED ORDER — FUROSEMIDE 10 MG/ML IJ SOLN
40.0000 mg | Freq: Every day | INTRAMUSCULAR | Status: DC
  Administered 2018-02-23 – 2018-02-24 (×2): 40 mg via INTRAVENOUS
  Filled 2018-02-23 (×2): qty 4

## 2018-02-23 MED ORDER — SODIUM PHOSPHATES 45 MMOLE/15ML IV SOLN
20.0000 mmol | Freq: Once | INTRAVENOUS | Status: AC
  Administered 2018-02-23: 20 mmol via INTRAVENOUS
  Filled 2018-02-23: qty 6.67

## 2018-02-23 MED ORDER — TRACE MINERALS CR-CU-MN-SE-ZN 10-1000-500-60 MCG/ML IV SOLN
INTRAVENOUS | Status: AC
  Administered 2018-02-23: 18:00:00 via INTRAVENOUS
  Filled 2018-02-23 (×2): qty 1200

## 2018-02-23 MED ORDER — FAT EMULSION PLANT BASED 20 % IV EMUL
240.0000 mL | INTRAVENOUS | Status: AC
  Administered 2018-02-23: 240 mL via INTRAVENOUS
  Filled 2018-02-23: qty 250

## 2018-02-23 MED ORDER — HEPARIN SODIUM (PORCINE) 5000 UNIT/ML IJ SOLN
5000.0000 [IU] | Freq: Three times a day (TID) | INTRAMUSCULAR | Status: DC
  Administered 2018-02-23 – 2018-03-01 (×16): 5000 [IU] via SUBCUTANEOUS
  Filled 2018-02-23 (×15): qty 1

## 2018-02-23 MED ORDER — HYDROMORPHONE HCL 1 MG/ML IJ SOLN
1.0000 mg | Freq: Once | INTRAMUSCULAR | Status: AC
  Administered 2018-02-23: 1 mg via INTRAVENOUS
  Filled 2018-02-23: qty 1

## 2018-02-23 NOTE — Progress Notes (Signed)
PROGRESS NOTE    [name]  VEL:[id number] DOB: 04/11/1928 DOA: 02/15/2018 PCP: Cassandria Anger, MD     Brief Narrative:  [name] is a 84 y.o.femalewith medical history significant ofendometrial cancer s/p hysterectomy and radiation more than 30 years ago, sigmoidal cancer s/p partial bowel resection, ileostomy and colostomy bag placement, nephrolithiasis s/p bilateral nephrostomy tube placement, severe malnutrition, and insomnia, presented with complaints of worsening abdominal pain and distension of one week duration.CT abd pelvis w no contrast done on admission revealed SBO and suspected intra abdominal abscess vs necrotic mass. Choledocholithiasis and cholelithiasis with normal LFTs.  Admitted for small bowel obstruction. General surgery consulted and following.  Hospitalization also complicated by sinus bradycardia and severe malnutrition.  Cardiology was consulted who recommended treating malnutrition to improve sinus bradycardia.  New events last 24 hours / Subjective: Complains of rectal (?vaginal) drainage and increase in pain in her rectum. CT A/P obtained yesterday evening revealed pelvic abscess. States she has not been able to rest much overnight. No nausea, vomiting.   Assessment & Plan:   Principal Problem:   SBO (small bowel obstruction) (HCC) Active Problems:   CANCER, ENDOMETRIUM   FISTULA, ANAL   Abdominal pain, generalized   Insomnia   Colostomy in place (HCC)   Nausea   Abdominal distension   Abnormal CT of the abdomen   Hypokalemia   Hypoalbuminemia   Severe protein-calorie malnutrition (HCC)   Microcytic anemia   Acute renal failure superimposed on stage 3 chronic kidney disease (HCC)   Hypotension   Small bowel obstruction, suspect secondary to adhesions from multiple abdominal surgeries -General surgery following -NGT removed -Tolerated clear liquid diet yesterday   Intra-abdominal abscess versus necrotic mass/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 Improved with IV lasix  -Does not appear in respiratory distress but still on [place] O2. Will schedule lasix 40mg  IV daily and monitor UOP. Stop D5 IVF. Continue TPN.   Asymptomatic sinus bradycardia  -Cardiology consulted, suspects 2/2 to severe malnutrition. Now signed off.  -Resolved. Normal sinus rate 70s this morning   Severe protein calorie malnutrition -TPN per pharmacy   CKD 4 -Baseline Cr appears to be 1.5 -Stable  Hx of endometrial cancer s/p radiation  -Radiation done more than 30 years ago  Hx of colon cancer s/p resection and colostomy  Hx of nephrolithiasis s/p nephrostomy tube placement  Chronic anemia/iron deficiency anemia -FOBT negative -Status post 2 units PRBC transfusion -Hemoglobin 6.1 on presentation -Transfused 2 units PRBC on 02/16/2018 -Hg stable  Cholelithiasis/choledocholithiasis -No sign of obstructive jaundice or transaminitis -GI will follow outpatient   DVT prophylaxis: Subq hep  Code Status: Full Family Communication: No family at bedside Disposition Plan: Pending clinical improvement, PT OT eval    Consultants:   General surgery  Cardiology  Radiation oncology  Gyn oncology  Palliative care team   IR  Procedures:   PICC 7/15   Antimicrobials:  Anti-infectives (From admission, onward)   Start     Dose/Rate Route Frequency Ordered Stop   02/20/18 1600  ampicillin-sulbactam (UNASYN) 1.5 g in sodium chloride 0.9 % 100 mL IVPB     1.5 g  200 mL/hr over 30 Minutes Intravenous Every 12 hours  02/20/18 1134 02/22/18 0531   02/16/18 0400  piperacillin-tazobactam (ZOSYN) IVPB 2.25 g  Status:  Discontinued     2.25 g 100 mL/hr over 30 Minutes Intravenous Every 6 hours 02/16/18 0009 02/20/18 1134   02/15/18 2215  piperacillin-tazobactam (ZOSYN) IVPB 3.375 g     3.375 g 100 mL/hr over 30 Minutes Intravenous  Once 02/15/18 2202 02/15/18 2333       Objective: Vitals:   02/22/18 2300 02/23/18 0000 02/23/18 0400 02/23/18 0800  BP: (!) 127/41  (!) 103/41   Pulse: 60  (!) 58   Resp: 20  (!) 27   Temp:  97.9 F (36.6 C) 98.2 F (36.8 C) 99.1 F (37.3 C)  TempSrc:  Oral Oral Oral  SpO2: 96%  93%   Weight:      Height:        Intake/Output Summary (Last 24 hours) at 02/23/2018 0821 Last data filed at 02/23/2018 0600 Gross per 24 hour  Intake 2613 ml  Output 2725 ml  Net -112 ml   Filed Weights   02/15/18 1740 02/16/18 0942 02/18/18 1130  Weight: 41.7 kg (92 lb) 40 kg (88 lb 2.9 oz) 41.7 kg (92 lb)    Examination: General exam: Appears calm and comfortable  Respiratory system: Clear to auscultation with diminished breath sound bilateral bases. Respiratory effort normal. Cardiovascular system: S1 & S2 heard, RRR rate 70s. No JVD, murmurs, rubs, gallops or clicks. No pedal edema. Gastrointestinal system: Abdomen is nondistended, soft and nontender. No organomegaly or masses felt. Normal bowel sounds heard. +colostomy  GU: Bilateral nephrostomy tubes  Central nervous system: Alert and oriented. No focal neurological deficits. Extremities: Symmetric 5 x 5 power. Skin: No rashes, lesions or ulcers on exposed skin  Psychiatry: Judgement and insight appear normal. Mood & affect appropriate.    Data Reviewed: I have personally reviewed following labs and imaging studies  CBC: Recent Labs  Lab 02/18/18 0536 02/20/18 0316 02/21/18 0305 02/21/18 1352 02/22/18 0422  WBC 6.6 7.0 7.0 6.9 7.9  NEUTROABS  --  5.6  --   --   --   HGB 9.6* 10.5* 9.4* 9.8* 10.1*  HCT 32.0*  35.2* 30.7* 32.2* 32.1*  MCV 80.6 80.2 78.9 77.8* 77.5*  PLT 189 202 135* 140* 854   Basic Metabolic Panel: Recent Labs  Lab 02/20/18 0316 02/21/18 0305 02/21/18 1352 02/22/18 0422 02/23/18 0240  NA 152* 142 139 137 132*  K 3.1* 3.5 3.9 3.0* 3.9  CL 121* 115* 111 106 104  CO2 25 21* 21* 23 24  GLUCOSE 103* 139* 159* 122* 104*  BUN 21 18 19 20 20   CREATININE 1.54* 1.38* 1.33* 1.33* 1.29*  CALCIUM 9.6 8.8* 8.6* 8.4* 8.5*  MG 1.8 1.6* 2.0 1.8 1.8  PHOS 1.8* 2.0* 4.1 2.9 2.4*   GFR: Estimated Creatinine Clearance: 19.8 mL/min (A) (by C-G formula based on SCr of 1.29 mg/dL (H)). Liver Function Tests: Recent Labs  Lab 02/17/18 0433 02/20/18 0316 02/21/18 0305 02/21/18 1352 02/22/18 0422  AST 16 20 21 18 17   ALT 12 13 14 15 14   ALKPHOS 77 78 75 79 76  BILITOT 1.6* 0.6 0.4 0.4 0.4  PROT 4.9* 5.4* 4.8* 5.2* 5.0*  ALBUMIN 2.0* 2.1* 1.8* 2.0* 1.9*   No results for input(s): LIPASE, AMYLASE in the last 168 hours. No results for input(s): AMMONIA in the last 168 hours. Coagulation Profile: No results for input(s): INR, PROTIME in the  last 168 hours. Cardiac Enzymes: Recent Labs  Lab 02/21/18 1352 02/21/18 2007  TROPONINI 0.10* 0.10*   BNP (last 3 results) No results for input(s): PROBNP in the last 8760 hours. HbA1C: No results for input(s): HGBA1C in the last 72 hours. CBG: Recent Labs  Lab 02/21/18 1435 02/21/18 2354 02/22/18 1555 02/22/18 2111 02/23/18 0621  GLUCAP 139* 144* 127* 99 103*   Lipid Profile: No results for input(s): CHOL, HDL, LDLCALC, TRIG, CHOLHDL, LDLDIRECT in the last 72 hours. Thyroid Function Tests: No results for input(s): TSH, T4TOTAL, FREET4, T3FREE, THYROIDAB in the last 72 hours. Anemia Panel: No results for input(s): VITAMINB12, FOLATE, FERRITIN, TIBC, IRON, RETICCTPCT in the last 72 hours. Sepsis Labs: Recent Labs  Lab 02/21/18 1352  LATICACIDVEN 1.7    Recent Results (from the past 240 hour(s))  Blood culture (routine x  2)     Status: None   Collection Time: 02/15/18 10:20 PM  Result Value Ref Range Status   Specimen Description   Final    BLOOD BLOOD RIGHT FOREARM Performed at [facility] [address], [place], [place] [address]    Special Requests   Final    BOTTLES DRAWN AEROBIC AND ANAEROBIC Blood Culture adequate volume Performed at [facility] [address], [place], [place] [address]    Culture   Final    NO GROWTH 5 DAYS Performed at [facility], [place] [address] [place], [place] [address]    Report Status 02/21/2018 FINAL  Final  Blood culture (routine x 2)     Status: None   Collection Time: 02/15/18 10:25 PM  Result Value Ref Range Status   Specimen Description   Final    BLOOD RIGHT ANTECUBITAL Performed at [facility] [address] [place], [place] [address]    Special Requests   Final    BOTTLES DRAWN AEROBIC AND ANAEROBIC Blood Culture adequate volume Performed at [facility] [address] [place], [place] [address]    Culture   Final    NO GROWTH 5 DAYS Performed at [facility], [place] [address] [place], [place] [address]    Report Status 02/21/2018 FINAL  Final  MRSA PCR Screening     Status: None   Collection Time: 02/18/18 12:50 PM  Result Value Ref Range Status   MRSA by PCR NEGATIVE NEGATIVE Final    Comment:        The GeneXpert MRSA Assay (FDA approved for NASAL specimens only), is one component of a comprehensive MRSA colonization surveillance program. It is not intended to diagnose MRSA infection nor to guide or monitor treatment for MRSA infections. Performed at [facility], [place] [address] [place], [place] [address]   Body fluid culture     Status: None   Collection Time: 02/18/18  3:14 PM  Result Value Ref Range Status   Specimen Description   Final    HEMODIALYSIS FISTULA Performed at [facility], [place]  [address] [place], [place] [address]    Special Requests   Final    Normal Performed at [facility], [place] [address] [place], [place] [address]    Gram Stain   Final    ABUNDANT WBC PRESENT, PREDOMINANTLY PMN ABUNDANT GRAM VARIABLE ROD Performed at [facility], [place] [address] [place], [place] [address]    Culture FEW ESCHERICHIA COLI FEW STREPTOCOCCUS GROUP F   Final   Report  Status 02/22/2018 FINAL  Final   Organism ID, Bacteria ESCHERICHIA COLI  Final      Susceptibility   Escherichia coli - MIC*    AMPICILLIN 4 SENSITIVE Sensitive     CEFAZOLIN <=4 SENSITIVE Sensitive     CEFEPIME <=1 SENSITIVE Sensitive     CEFTAZIDIME <=1 SENSITIVE Sensitive     CEFTRIAXONE <=1 SENSITIVE Sensitive     CIPROFLOXACIN <=0.25 SENSITIVE Sensitive     GENTAMICIN <=1 SENSITIVE Sensitive     IMIPENEM <=0.25 SENSITIVE Sensitive     TRIMETH/SULFA <=20 SENSITIVE Sensitive     AMPICILLIN/SULBACTAM <=2 SENSITIVE Sensitive     PIP/TAZO <=4 SENSITIVE Sensitive     Extended ESBL NEGATIVE Sensitive     * FEW ESCHERICHIA COLI       Radiology Studies: Dg Abd 1 View  Result Date: 02/21/2018 CLINICAL DATA:  Abdominal distension EXAM: ABDOMEN - 1 VIEW COMPARISON:  02/19/2018 FINDINGS: Nonobstructive bowel gas pattern. Bilateral pigtail percutaneous nephrostomy catheters. Enteric tube is been removed. Visualized osseous structures are within normal limits. IMPRESSION: Bilateral pigtail percutaneous nephrostomy catheters. Otherwise unremarkable abdominal radiograph. Electronically Signed   By: Julian Hy M.D.   On: 02/21/2018 14:11   Ct Abdomen Pelvis W Contrast  Result Date: 02/22/2018 CLINICAL DATA:  Anal fissure.  Abscess. EXAM: CT ABDOMEN AND PELVIS WITH CONTRAST TECHNIQUE: Multidetector CT imaging of the abdomen and pelvis was performed using the standard protocol following bolus administration of intravenous contrast. CONTRAST:  50mL OMNIPAQUE IOHEXOL 300 MG/ML SOLN,  42mL ISOVUE-300 IOPAMIDOL (ISOVUE-300) INJECTION 61% COMPARISON:  CT scan 02/15/2018 FINDINGS: Lower chest: Large bilateral pleural effusions with passive atelectasis. Hepatobiliary: Gallbladder wall thickening with 2.4 cm in length filling defect in the gallbladder favoring gallstone. Intrahepatic biliary dilatation, with ill definition of the extrahepatic biliary tree but with suspicion for multiple filling defects in the common bile duct favoring choledocholithiasis. 5 mm hypodense lesion in the dome of segment 4a of the liver on image 10/2, nonspecific related to small size. Pancreas: There is diffuse mesenteric edema and this does not appear the peripancreatic region. Strictly speaking I cannot exclude acute pancreatitis. Pseudocyst, pancreatic abscess, or pancreatic necrosis. Correlate with lipase levels. Spleen: Unremarkable Adrenals/Urinary Tract: Both adrenal glands appear unremarkable. There is scarring of the right anterior kidney. Bilateral nephrostomy tubes are present with tubing coiled in the renal collecting systems. There are several devices along the urinary tract which may represent ureteral expanders, although correlation with patient history is recommended with respect to these devices. One of these is in the left mid kidney on image 56/5. There is a device in the right mid ureter on image 40/5 and another similar device just past this in the right mid ureter on image 45/5. A fourth such device is present in the left distal ureter on image 53/5. Correlate with urologic history. The right proximal ureter is expanded and indistinct. The midsection of the ureter is completely obscured by surrounding soft tissue densities, and both distal ureters are extremely indistinct and cannot be readily followed due to surrounding soft tissue densities. A structure believed to represent the urinary bladder on image 54/6 contains gas and complex fluid/material, and has accentuated enhancement along its mucosal  margin and prominently indistinct peripheral wall margins due to surrounding edema. Stomach/Bowel: Probable contraction in the stomach body causing the circumferential wall thickening on image 28/2. The duodenum is somewhat displaced to the right but crosses back to midline with ligament of Treitz in the appropriate position. There are multiple scattered air-fluid  levels in dilated loops of small bowel measuring up to about 3.6 cm in diameter. There is wall thickening especially down in the pelvis, with a right pelvic loop of small bowel terminating in the indistinct soft tissue density in the pelvis on 56/2. There is quite likely obstruction in this vicinity. By report the patient has had a low anterior resection. A left ostomy is present. There is a somewhat long mucous fistula extending down towards the pelvis, with the walls of this structure severely indistinct. There appears to be some dilute contrast medium within this presumed blind-ending pouch, and a small amount of contrast in the thickened rectum on image 60/2. The rectum is notably effaced in the vicinity of a 3.1 by 5.4 by 5.5 cm (volume = 48 cm^3) rim enhancing hypodense collection in the anorectal region posterior to the urinary bladder, suspicious for abscess or tumor. This process extends down towards the perineum. There is also an appearance suspicious for either tumor, abscess, or extravasation in the right hemipelvis in the perirectal region measuring about 2.6 by 2.4 by 2.8 cm (volume = 9.1 cm^3) on image 57/2. Vascular/Lymphatic: Aortoiliac atherosclerotic vascular disease. Aortocaval node 1.0 cm in short axis on image 26/2. The portal vein appears patent. Reproductive: Prior hysterectomy. Severe indistinctness of tissue planes in the pelvis, potentially at least partially due to remote radiation therapy. Ovaries not well seen. Other: Mild ascites. Diffuse edema throughout the omentum mesentery, especially confluent in the pelvis where there  is poor delineation of fascial planes due to the degree of edema. Subcutaneous edema is also present. Musculoskeletal: Lipoma anterior to the left hip. Prominent bilateral degenerative hip arthropathy. Diffuse bony demineralization. Grade 1 degenerative anterolisthesis at L4-5. Presacral edema. IMPRESSION: 1. Highly complex appearance in the pelvis due to indistinctness fat planes, related to a combination of postoperative, post radiation therapy, and inflammatory findings. 2. There appears to be an 48 cubic cm abscess or tumor between the bladder and in the rectal region, extending towards the perineum. There is also a focus of tumor, abscess, or perforation of the mucous fistula in the right hemipelvis in the perirectal region measuring about 9 cubic cm. 3. Bilateral nephrostomy tubes noted, with various devices in the left collecting system and bilateral ureters probably representing ureteral expanders. Correlate with urologic history. The distal halves of both ureters are severely indistinct. 4. Extensive third spacing of fluid with subcutaneous edema, large bilateral pleural effusions, some ascites, and diffuse mesenteric and omental edema. 5. Mildly dilated loops of small bowel with air-fluid levels suspicious for mild small bowel obstruction. A distal small bowel loop extending into the pelvis has a severely effaced segment and may be the lead point for obstruction eccentric to the right for example on image 56/2. 6. Large gallstone with gallbladder wall thickening. Multiple filling defects in the extrahepatic biliary tree suspicious for choledocholithiasis. Intrahepatic biliary dilatation results. 7. Peripancreatic edema is not disproportionate to the mesenteric edema elsewhere, but acute uncomplicated pancreatitis cannot be excluded. 8. Mildly enlarged aortocaval lymph node. 9. Other imaging findings of potential clinical significance: Aortic Atherosclerosis (ICD10-I70.0). Prominent bilateral degenerative  hip arthropathy. Bony demineralization. Lipoma anterior to the left hip. Electronically Signed   By: Van Clines M.D.   On: 02/22/2018 18:48   Dg Chest Port 1 View  Result Date: 02/21/2018 CLINICAL DATA:  Anemia, shortness of breath EXAM: PORTABLE CHEST 1 VIEW COMPARISON:  02/15/2018 FINDINGS: Mild interstitial edema. Suspected small bilateral pleural effusions, right greater than left. No pneumothorax. The heart is  normal in size. Left arm PICC terminates cavoatrial junction. IMPRESSION: Mild interstitial edema with small bilateral pleural effusions, right greater than left, new. Electronically Signed   By: Julian Hy M.D.   On: 02/21/2018 14:09      Scheduled Meds: . chlorhexidine  15 mL Mouth Rinse BID  . Chlorhexidine Gluconate Cloth  6 each Topical Daily  . furosemide  40 mg Intravenous Daily  . heparin injection (subcutaneous)  5,000 Units Subcutaneous Q8H  . insulin aspart  0-9 Units Subcutaneous 3 times per day  . mouth rinse  15 mL Mouth Rinse q12n4p  . sodium chloride flush  10-40 mL Intracatheter Q12H  . sodium chloride flush  3 mL Intravenous Q12H   Continuous Infusions: . sodium chloride Stopped (02/20/18 1053)  . sodium phosphate  Dextrose 5% IVPB    . Marland KitchenTPN (CLINIMIX-E) Adult 40 mL/hr at 02/22/18 1900     LOS: 7 days    Time spent: 25 minutes   Dessa Phi, DO [facility] [email or web address] Password [email or web address] 02/23/2018, 8:21 AM

## 2018-02-23 NOTE — Progress Notes (Signed)
PHARMACY - ADULT TOTAL PARENTERAL NUTRITION CONSULT NOTE   Pharmacy Consult for TPN Indication: SBO  Patient Measurements: Height: 5\' 4"  (162.6 cm) Weight: 92 lb (41.7 kg) IBW/kg (Calculated) : 54.7 TPN AdjBW (KG): 41.7 Body mass index is 15.79 kg/m. Usual Weight: 45kg  Insulin Requirements: 1 unit novolog in past 24hr  Current Nutrition: advanced to clear liquids 7/18, then back to NPO 7/19  IVF: D5 at 53ml/hr  Central access: Double lumen PICC  TPN start date: 7/16  ASSESSMENT                                                                                                          HPI: 84 yo female admitted 7/11 with worsening abdominal pain and distension since last week.CT abd pelvis w no contrast done on admission revealed SBO and suspected intra abdominal abscess vs necrotic mass. PMH includes endometrial cancer s/p hysterectomy, sigmoidal cancer s/p partial bowel resection, ileostomy and colostomy bag placement, nephrolithiasis s/p bilateral nephrostomy tube placement, hyponatremia, severe malnutrition, and insomnia.  CCS recommends to continue NPO, NGT to suction and begin TPN.  PICC ordered.  Significant events:  7/16: Start TPN. NGT removed. 7/18: Advance to clear liquids. Abdominal CT  Today:    Glucose: No hx DM, CBGs at goal (< 150)  Electrolytes: K increased to WNL; Na has been elevated but now < normal range; Phos low 2.4; Mag  1.8; corrected Ca normal.  Renal: CKD 4 (baseline SCr ~1.5), SCr 1.92 on admit, down 1.29 today. UOP 3375 ml/24hr.  LFTs 7/18: AST/ALT WNL, Tbili elevated at 1.6 on 7/13 but down to normal.  TGs: 101 (7/16)  Prealbumin: 10.2 (7/16)  NUTRITIONAL GOALS                                                                                             RD recs on 7/15:  65-80 g/day protein, 1400-1600 kcal/day Clinimix / at a goal rate of 108ml/hr + 20% fat emulsion at 14ml/hr to provide: 72g/day protein, 1502Kcal/day.  PLAN  Now: Sodium phosphate 79mmol IV x 1    Increase Clinimix E 5/15 to 65ml/hr - have been slowly advancing due to electrolyte abnormalities/refeeding  20% fat emulsion at 58ml/hr x 12 hrs.  Plan to advance as tolerated to the goal rate.  TPN to contain standard multivitamins and trace elements.  TPN to contain famotidine 20mg  (renally adjusted for CrCl < 75ml/min)  Discontinue IVF per MD.  Continue SSI sensitive scale q8h .   TPN lab panels on Mondays & Thursdays.  BMP, magnesium, phosphorus with AM labs   F/u daily.   Peggyann Juba, PharmD, BCPS Pager: [phone] 02/23/2018 7:37 AM

## 2018-02-23 NOTE — Progress Notes (Signed)
IR consulted for possible pelvic abscess vs. Necrotic mass in patient with SBO. Of note, patient on empiric antibiotics.  Afebrile.  WBC WNL.  She is currently admitted to the ICU for sinus bradycardia with severe malnutrition and evidence of refeeding syndrome (hypokalemia, hypophosphatemia).   CT Abdomen/Pelvis 02/22/18 shows: 1. Highly complex appearance in the pelvis due to indistinctness fat planes, related to a combination of postoperative, post radiation therapy, and inflammatory findings. 2. There appears to be an 48 cubic cm abscess or tumor between the bladder and in the rectal region, extending towards the perineum. There is also a focus of tumor, abscess, or perforation of the mucous fistula in the right hemipelvis in the perirectal region measuring about 9 cubic cm. 3. Bilateral nephrostomy tubes noted, with various devices in the left collecting system and bilateral ureters probably representing ureteral expanders. Correlate with urologic history. The distal halves of both ureters are severely indistinct. 4. Extensive third spacing of fluid with subcutaneous edema, large bilateral pleural effusions, some ascites, and diffuse mesenteric and omental edema. 5. Mildly dilated loops of small bowel with air-fluid levels suspicious for mild small bowel obstruction. A distal small bowel loop extending into the pelvis has a severely effaced segment andmay be the lead point for obstruction eccentric to the right for example on image 56/2. 6. Large gallstone with gallbladder wall thickening. Multiple filling defects in the extrahepatic biliary tree suspicious for choledocholithiasis. Intrahepatic biliary dilatation results. 7. Peripancreatic edema is not disproportionate to the mesenteric edema elsewhere, but acute uncomplicated pancreatitis cannot be excluded. 8. Mildly enlarged aortocaval lymph node.  Imaging and case reviewed by Dr. 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  There is a small separate fluid collection in the right hemipelvis which is not amenable to drainage at this time.   No procedure planned in IR at this time.   Brynda Greathouse, MS RD PA-C 10:28 AM

## 2018-02-23 NOTE — Progress Notes (Signed)
OT Cancellation Note  Patient Details Name: [name] MRN: [id number] DOB: July 01, 1928   Cancelled Treatment:    Reason Eval/Treat Not Completed: Pain limiting ability to participate.  Will check back later or over the weekend as schedule permits.  Braeden Dolinski 02/23/2018, 2:18 PM  Lesle Chris, OTR/L [phone] 02/23/2018

## 2018-02-23 NOTE — Evaluation (Signed)
Occupational Therapy Evaluation Patient Details Name: [name] MRN: [id number] DOB: 12/02/1940 Today's Date: 02/23/2018    History of Present Illness pt was admitted for SBO.  PMH:  endometrial and colon CA, and low back pain   Clinical Impression   This 84 year old female was admitted for the above. At baseline, she is independent with adls.  She sponge bathes and manages ostomy/nephrostomy tubes herself.  Pt lives alone, but son lives next door. Pt was agreeable to sitting EOB this pm; ADLs assessed from this level. Goals in acute are for min guard to min A levels.    Follow Up Recommendations  SNF(depending on progress)    Equipment Recommendations  None recommended by OT    Recommendations for Other Services       Precautions / Restrictions Precautions Precautions: Fall Restrictions Weight Bearing Restrictions: No      Mobility Bed Mobility Overal bed mobility: Needs Assistance Bed Mobility: Sidelying to Sit;Sit to Sidelying   Sidelying to sit: Mod assist     Sit to sidelying: Mod assist General bed mobility comments: assist for trunk and legs  Transfers                 General transfer comment: not tested    Balance                                           ADL either performed or assessed with clinical judgement   ADL Overall ADL's : Needs assistance/impaired Eating/Feeding: Independent   Grooming: Set up;Wash/dry hands;Sitting   Upper Body Bathing: Set up;Sitting   Lower Body Bathing: Maximal assistance;Sitting/lateral leans   Upper Body Dressing : Minimal assistance;Sitting   Lower Body Dressing: Total assistance;Sitting/lateral leans                 General ADL Comments: Pt agreeable to up at EOB. Did not want to stand this session.       Vision         Perception     Praxis      Pertinent Vitals/Pain Pain Assessment: No/denies pain     Hand Dominance     Extremity/Trunk Assessment Upper  Extremity Assessment Upper Extremity Assessment: Overall WFL for tasks assessed           Communication Communication Communication: No difficulties   Cognition Arousal/Alertness: Awake/alert Behavior During Therapy: WFL for tasks assessed/performed Overall Cognitive Status: Within Functional Limits for tasks assessed                                     General Comments  pt sat eob x 15 minutes. She states that she sat up in chair 4 days ago but was up for 2 1/2 hours and didn't want to sit up in the chair again as she feels she overdid it. VSS    Exercises     Shoulder Instructions      Home Living Family/patient expects to be discharged to:: Private residence Living Arrangements: Alone   Type of Home: House Home Access: Stairs to enter Technical brewer of Steps: 4 Entrance Stairs-Rails: (one) Home Layout: One level     Bathroom Shower/Tub: (sponge bathes)   Bathroom Toilet: (ostomy/neph drains)     Home Equipment: None   Additional Comments: son lives next  door      Prior Functioning/Environment Level of Independence: Independent                 OT Problem List: Decreased strength;Decreased activity tolerance;Decreased knowledge of use of DME or AE(balance NT)      OT Treatment/Interventions: Self-care/ADL training;Energy conservation;Patient/family education;Balance training;Therapeutic activities;DME and/or AE instruction    OT Goals(Current goals can be found in the care plan section) Acute Rehab OT Goals Patient Stated Goal: get strength back OT Goal Formulation: With patient Time For Goal Achievement: 03/09/18 Potential to Achieve Goals: Good ADL Goals Pt Will Perform Lower Body Bathing: with min guard assist;with adaptive equipment;sit to/from stand Pt Will Perform Lower Body Dressing: with min guard assist;sit to/from stand;with adaptive equipment Additional ADL Goal #1: pt will perform bed mobility at mod I level in  preparation for adls Additional ADL Goal #2: Pt will ambulate in room at min guard level to gather ADL items  OT Frequency: Min 2X/week   Barriers to D/C:            Co-evaluation              AM-PAC PT "6 Clicks" Daily Activity     Outcome Measure Help from another person eating meals?: None Help from another person taking care of personal grooming?: A Little Help from another person toileting, which includes using toliet, bedpan, or urinal?: A Lot Help from another person bathing (including washing, rinsing, drying)?: A Lot Help from another person to put on and taking off regular upper body clothing?: A Little Help from another person to put on and taking off regular lower body clothing?: Total 6 Click Score: 15   End of Session    Activity Tolerance: Patient tolerated treatment well Patient left: in bed;with call bell/phone within reach;with bed alarm set  OT Visit Diagnosis: Muscle weakness (generalized) (M62.81)                Time: 1683-7290 OT Time Calculation (min): 28 min Charges:  OT General Charges $OT Visit: 1 Visit OT Evaluation $OT Eval Low Complexity: 1 Low OT Treatments $Therapeutic Activity: 8-22 mins G-Codes:     [place], OTR/L [phone] 02/23/2018  Lashon Hillier 02/23/2018, 3:45 PM

## 2018-02-23 NOTE — Progress Notes (Signed)
PT Cancellation Note  Patient Details Name: [name] MRN: [id number] DOB: [birth year]   Cancelled Treatment:    Reason Eval/Treat Not Completed: Fatigue/lethargy limiting ability to participate; reports not sleeping due to pain, just got pain medication and trying to rest.  Noted procedure planned for abcess drain.  Will attempt another day.   Reginia Naas 02/23/2018, 10:26 AM  Magda Kiel, [place] 02/23/2018

## 2018-02-23 NOTE — Progress Notes (Signed)
[facility] Progress Note     Subjective: CC: feels lousy all over Patient states she is sore from being in bed and just feels lousy from all the tests. Discussed possibility of seeing if IR would be able to drain pelvic collection, pt seems unsure whether this could help her or not. Denies SOB. Colostomy putting out more and patient tolerated CLD yesterday. Not much appetite.   Objective: Vital signs in last 24 hours: Temp:  [97.9 F (36.6 C)-99.2 F (37.3 C)] 98.2 F (36.8 C) (07/19 0400) Pulse Rate:  [55-227] 58 (07/19 0400) Resp:  [14-38] 27 (07/19 0400) BP: (103-151)/(34-74) 103/41 (07/19 0400) SpO2:  [93 %-100 %] 93 % (07/19 0400) Last BM Date: 02/22/18  Intake/Output from previous day: 07/18 0701 - 07/19 0700 In: 2813 [I.V.:2409.7; IV Piggyback:403.3] Out: 3375 [Urine:2300; ACZYS:0630] Intake/Output this shift: No intake/output data recorded.  PE: Gen:  Alert, NAD, pleasant Card:  Regular rate and rhythm, pedal pulses 2+ BL Pulm:  Normal effort, clear to auscultation bilaterally Abd: Soft, non-tender, non-distended, bowel sounds present, no HSM, colostomy with stool and gas in appliance Skin: warm and dry, no rashes  Psych: A&Ox3   Lab Results:  Recent Labs    02/21/18 1352 02/22/18 0422  WBC 6.9 7.9  HGB 9.8* 10.1*  HCT 32.2* 32.1*  PLT 140* 159   BMET Recent Labs    02/22/18 0422 02/23/18 0240  NA 137 132*  K 3.0* 3.9  CL 106 104  CO2 23 24  GLUCOSE 122* 104*  BUN 20 20  CREATININE 1.33* 1.29*  CALCIUM 8.4* 8.5*   PT/INR No results for input(s): LABPROT, INR in the last 72 hours. CMP     Component Value Date/Time   NA 132 (L) 02/23/2018 0240   K 3.9 02/23/2018 0240   CL 104 02/23/2018 0240   CO2 24 02/23/2018 0240   GLUCOSE 104 (H) 02/23/2018 0240   BUN 20 02/23/2018 0240   CREATININE 1.29 (H) 02/23/2018 0240   CALCIUM 8.5 (L) 02/23/2018 0240   CALCIUM 9.8 11/21/2006 2218   PROT 5.0 (L) 02/22/2018 0422   ALBUMIN 1.9 (L)  02/22/2018 0422   AST 17 02/22/2018 0422   ALT 14 02/22/2018 0422   ALKPHOS 76 02/22/2018 0422   BILITOT 0.4 02/22/2018 0422   GFRNONAA 36 (L) 02/23/2018 0240   GFRAA 42 (L) 02/23/2018 0240   Lipase     Component Value Date/Time   LIPASE 20 02/15/2018 1819       Studies/Results: Dg Abd 1 View  Result Date: 02/21/2018 CLINICAL DATA:  Abdominal distension EXAM: ABDOMEN - 1 VIEW COMPARISON:  02/19/2018 FINDINGS: Nonobstructive bowel gas pattern. Bilateral pigtail percutaneous nephrostomy catheters. Enteric tube is been removed. Visualized osseous structures are within normal limits. IMPRESSION: Bilateral pigtail percutaneous nephrostomy catheters. Otherwise unremarkable abdominal radiograph. Electronically Signed   By: Julian Hy M.D.   On: 02/21/2018 14:11   Ct Abdomen Pelvis W Contrast  Result Date: 02/22/2018 CLINICAL DATA:  Anal fissure.  Abscess. EXAM: CT ABDOMEN AND PELVIS WITH CONTRAST TECHNIQUE: Multidetector CT imaging of the abdomen and pelvis was performed using the standard protocol following bolus administration of intravenous contrast. CONTRAST:  73mL OMNIPAQUE IOHEXOL 300 MG/ML SOLN, 12mL ISOVUE-300 IOPAMIDOL (ISOVUE-300) INJECTION 61% COMPARISON:  CT scan 02/15/2018 FINDINGS: Lower chest: Large bilateral pleural effusions with passive atelectasis. Hepatobiliary: Gallbladder wall thickening with 2.4 cm in length filling defect in the gallbladder favoring gallstone. Intrahepatic biliary dilatation, with ill definition of the extrahepatic biliary tree but  with suspicion for multiple filling defects in the common bile duct favoring choledocholithiasis. 5 mm hypodense lesion in the dome of segment 4a of the liver on image 10/2, nonspecific related to small size. Pancreas: There is diffuse mesenteric edema and this does not appear the peripancreatic region. Strictly speaking I cannot exclude acute pancreatitis. Pseudocyst, pancreatic abscess, or pancreatic necrosis. Correlate  with lipase levels. Spleen: Unremarkable Adrenals/Urinary Tract: Both adrenal glands appear unremarkable. There is scarring of the right anterior kidney. Bilateral nephrostomy tubes are present with tubing coiled in the renal collecting systems. There are several devices along the urinary tract which may represent ureteral expanders, although correlation with patient history is recommended with respect to these devices. One of these is in the left mid kidney on image 56/5. There is a device in the right mid ureter on image 40/5 and another similar device just past this in the right mid ureter on image 45/5. A fourth such device is present in the left distal ureter on image 53/5. Correlate with urologic history. The right proximal ureter is expanded and indistinct. The midsection of the ureter is completely obscured by surrounding soft tissue densities, and both distal ureters are extremely indistinct and cannot be readily followed due to surrounding soft tissue densities. A structure believed to represent the urinary bladder on image 54/6 contains gas and complex fluid/material, and has accentuated enhancement along its mucosal margin and prominently indistinct peripheral wall margins due to surrounding edema. Stomach/Bowel: Probable contraction in the stomach body causing the circumferential wall thickening on image 28/2. The duodenum is somewhat displaced to the right but crosses back to midline with ligament of Treitz in the appropriate position. There are multiple scattered air-fluid levels in dilated loops of small bowel measuring up to about 3.6 cm in diameter. There is wall thickening especially down in the pelvis, with a right pelvic loop of small bowel terminating in the indistinct soft tissue density in the pelvis on 56/2. There is quite likely obstruction in this vicinity. By report the patient has had a low anterior resection. A left ostomy is present. There is a somewhat long mucous fistula extending  down towards the pelvis, with the walls of this structure severely indistinct. There appears to be some dilute contrast medium within this presumed blind-ending pouch, and a small amount of contrast in the thickened rectum on image 60/2. The rectum is notably effaced in the vicinity of a 3.1 by 5.4 by 5.5 cm (volume = 48 cm^3) rim enhancing hypodense collection in the anorectal region posterior to the urinary bladder, suspicious for abscess or tumor. This process extends down towards the perineum. There is also an appearance suspicious for either tumor, abscess, or extravasation in the right hemipelvis in the perirectal region measuring about 2.6 by 2.4 by 2.8 cm (volume = 9.1 cm^3) on image 57/2. Vascular/Lymphatic: Aortoiliac atherosclerotic vascular disease. Aortocaval node 1.0 cm in short axis on image 26/2. The portal vein appears patent. Reproductive: Prior hysterectomy. Severe indistinctness of tissue planes in the pelvis, potentially at least partially due to remote radiation therapy. Ovaries not well seen. Other: Mild ascites. Diffuse edema throughout the omentum mesentery, especially confluent in the pelvis where there is poor delineation of fascial planes due to the degree of edema. Subcutaneous edema is also present. Musculoskeletal: Lipoma anterior to the left hip. Prominent bilateral degenerative hip arthropathy. Diffuse bony demineralization. Grade 1 degenerative anterolisthesis at L4-5. Presacral edema. IMPRESSION: 1. Highly complex appearance in the pelvis due to indistinctness fat planes, related  to a combination of postoperative, post radiation therapy, and inflammatory findings. 2. There appears to be an 48 cubic cm abscess or tumor between the bladder and in the rectal region, extending towards the perineum. There is also a focus of tumor, abscess, or perforation of the mucous fistula in the right hemipelvis in the perirectal region measuring about 9 cubic cm. 3. Bilateral nephrostomy tubes  noted, with various devices in the left collecting system and bilateral ureters probably representing ureteral expanders. Correlate with urologic history. The distal halves of both ureters are severely indistinct. 4. Extensive third spacing of fluid with subcutaneous edema, large bilateral pleural effusions, some ascites, and diffuse mesenteric and omental edema. 5. Mildly dilated loops of small bowel with air-fluid levels suspicious for mild small bowel obstruction. A distal small bowel loop extending into the pelvis has a severely effaced segment and may be the lead point for obstruction eccentric to the right for example on image 56/2. 6. Large gallstone with gallbladder wall thickening. Multiple filling defects in the extrahepatic biliary tree suspicious for choledocholithiasis. Intrahepatic biliary dilatation results. 7. Peripancreatic edema is not disproportionate to the mesenteric edema elsewhere, but acute uncomplicated pancreatitis cannot be excluded. 8. Mildly enlarged aortocaval lymph node. 9. Other imaging findings of potential clinical significance: Aortic Atherosclerosis (ICD10-I70.0). Prominent bilateral degenerative hip arthropathy. Bony demineralization. Lipoma anterior to the left hip. Electronically Signed   By: Van Clines M.D.   On: 02/22/2018 18:48   Dg Chest Port 1 View  Result Date: 02/21/2018 CLINICAL DATA:  Anemia, shortness of breath EXAM: PORTABLE CHEST 1 VIEW COMPARISON:  02/15/2018 FINDINGS: Mild interstitial edema. Suspected small bilateral pleural effusions, right greater than left. No pneumothorax. The heart is normal in size. Left arm PICC terminates cavoatrial junction. IMPRESSION: Mild interstitial edema with small bilateral pleural effusions, right greater than left, new. Electronically Signed   By: Julian Hy M.D.   On: 02/21/2018 14:09    Anti-infectives: Anti-infectives (From admission, onward)   Start     Dose/Rate Route Frequency Ordered Stop    02/20/18 1600  ampicillin-sulbactam (UNASYN) 1.5 g in sodium chloride 0.9 % 100 mL IVPB     1.5 g 200 mL/hr over 30 Minutes Intravenous Every 12 hours 02/20/18 1134 02/22/18 0531   02/16/18 0400  piperacillin-tazobactam (ZOSYN) IVPB 2.25 g  Status:  Discontinued     2.25 g 100 mL/hr over 30 Minutes Intravenous Every 6 hours 02/16/18 0009 02/20/18 1134   02/15/18 2215  piperacillin-tazobactam (ZOSYN) IVPB 3.375 g     3.375 g 100 mL/hr over 30 Minutes Intravenous  Once 02/15/18 2202 02/15/18 2333       Assessment/Plan Hx of colon cancer Hx of endometrial cancer Radiation fibrosis Anemia of chronic disease - H/H 10.1/32.1 yesterday Severe protein calorie malnutrition Ureteral stenosis and hx of nephrolithiasis - b/l nephrostomy tubes present Depression  Choledocholithiasis - outpatient GI follow up, no signs of obstruction or cholangitis  SBO - multiple past abdominal surgeries, likely due to adhesive disease - CT 7/11: sbo with transition within the pelvis, likely adhesions with ?mass in pelvis - on gyn/onc exam - found large cavity in the pelvis with open vaginal cuff - presumably from prior radiation? -xray 7/17 normal. Continue TPN until reliably tolerating regular diet for 24hrs - patient had > 1L stool output from colostomy overnight  Intra-abdominal abscess vs necrotic mass - CT 7/11: ill-defined collection within pelvis, anterior to rectum and posterior to bladder. Possible fistulous tract extending to collection  - CT 7/18:  1. Highly complex appearance in the pelvis due to indistinctness fat planes, related to a combination of postoperative, post radiation therapy, and inflammatory findings. 2. There appears to be an 48 cubic cm abscess or tumor between the bladder and in the rectal region, extending towards the perineum. There is also a focus of tumor, abscess, or perforation of the mucous fistula in the right hemipelvis in the perirectal region measuring about 9 cubic cm. -  WBC 7.9 yesterday and patient afebrile - will consult IR for possible drain today  FEN: NPO, TPN VTE: SQ heparin  ID: Zosyn 7/11>7/16; Unasyn 7/16>7/18  LOS: 7 days    Brigid Re , [facility] 02/23/2018, 7:43 AM Pager: [phone] Consults: [phone] Mon-Fri 7:00 am-4:30 pm Sat-Sun 7:00 am-11:30 am

## 2018-02-24 LAB — GLUCOSE, CAPILLARY
Glucose-Capillary: 106 mg/dL — ABNORMAL HIGH (ref 70–99)
Glucose-Capillary: 143 mg/dL — ABNORMAL HIGH (ref 70–99)
Glucose-Capillary: 139 mg/dL — ABNORMAL HIGH (ref 70–99)
Glucose-Capillary: 99 mg/dL (ref 70–99)

## 2018-02-24 LAB — BASIC METABOLIC PANEL
Anion gap: 5 (ref 5–15)
BUN: 25 mg/dL — ABNORMAL HIGH (ref 8–23)
CHLORIDE: 105 mmol/L (ref 98–111)
CO2: 25 mmol/L (ref 22–32)
Calcium: 8.4 mg/dL — ABNORMAL LOW (ref 8.9–10.3)
Creatinine, Ser: 1.25 mg/dL — ABNORMAL HIGH (ref 0.44–1.00)
GFR calc Af Amer: 43 mL/min — ABNORMAL LOW (ref >60)
GFR calc non Af Amer: 37 mL/min — ABNORMAL LOW (ref >60)
GLUCOSE: 103 mg/dL — AB (ref 70–99)
POTASSIUM: 3.5 mmol/L (ref 3.5–5.1)
Sodium: 135 mmol/L (ref 135–145)

## 2018-02-24 LAB — CBC
HEMATOCRIT: 30 % — AB (ref 36.0–46.0)
HEMOGLOBIN: 9.4 g/dL — AB (ref 12.0–15.0)
MCH: 24.4 pg — AB (ref 26.0–34.0)
MCHC: 31.3 g/dL (ref 30.0–36.0)
MCV: 77.9 fL — AB (ref 78.0–100.0)
PLATELETS: 146 10*3/uL — AB (ref 150–400)
RBC: 3.85 MIL/uL — AB (ref 3.87–5.11)
RDW: 22.7 % — ABNORMAL HIGH (ref 11.5–15.5)
WBC: 5.2 10*3/uL (ref 4.0–10.5)

## 2018-02-24 LAB — PHOSPHORUS: Phosphorus: 3.5 mg/dL (ref 2.5–4.6)

## 2018-02-24 LAB — MAGNESIUM: Magnesium: 1.7 mg/dL (ref 1.7–2.4)

## 2018-02-24 MED ORDER — FAT EMULSION PLANT BASED 20 % IV EMUL
240.0000 mL | INTRAVENOUS | Status: AC
  Administered 2018-02-24: 240 mL via INTRAVENOUS
  Filled 2018-02-24: qty 250

## 2018-02-24 MED ORDER — ORAL CARE MOUTH RINSE
15.0000 mL | Freq: Two times a day (BID) | OROMUCOSAL | Status: DC
  Administered 2018-02-27 – 2018-03-01 (×3): 15 mL via OROMUCOSAL

## 2018-02-24 MED ORDER — MAGNESIUM SULFATE IN D5W 1-5 GM/100ML-% IV SOLN
1.0000 g | Freq: Once | INTRAVENOUS | Status: AC
  Administered 2018-02-24: 1 g via INTRAVENOUS
  Filled 2018-02-24: qty 100

## 2018-02-24 MED ORDER — HYDROMORPHONE HCL 1 MG/ML IJ SOLN
1.0000 mg | INTRAMUSCULAR | Status: DC | PRN
Start: 2018-02-24 — End: 2018-02-26
  Administered 2018-02-24 – 2018-02-26 (×7): 1 mg via INTRAVENOUS
  Filled 2018-02-24 (×7): qty 1

## 2018-02-24 MED ORDER — POTASSIUM CHLORIDE 20 MEQ/15ML (10%) PO SOLN
40.0000 meq | Freq: Once | ORAL | Status: AC
  Administered 2018-02-24: 40 meq via ORAL
  Filled 2018-02-24: qty 30

## 2018-02-24 MED ORDER — TRACE MINERALS CR-CU-MN-SE-ZN 10-1000-500-60 MCG/ML IV SOLN
INTRAVENOUS | Status: AC
  Administered 2018-02-24: 18:00:00 via INTRAVENOUS
  Filled 2018-02-24: qty 1200

## 2018-02-24 NOTE — Progress Notes (Addendum)
PROGRESS NOTE    [name]  TGY:[id number] DOB: [birth year] DOA: 02/15/2018 PCP: Cassandria Anger, MD     Brief Narrative:  [name] is a 84 y.o.femalewith medical history significant ofendometrial cancer s/p hysterectomy and radiation more than 30 years ago, sigmoidal cancer s/p partial bowel resection, ileostomy and colostomy bag placement, nephrolithiasis s/p bilateral nephrostomy tube placement, severe malnutrition, and insomnia, presented with complaints of worsening abdominal pain and distension of one week duration.CT abd pelvis w no contrast done on admission revealed SBO and suspected intra abdominal abscess vs necrotic mass. Choledocholithiasis and cholelithiasis with normal LFTs.  Admitted for small bowel obstruction. General surgery consulted and following.  Hospitalization also complicated by sinus bradycardia and severe malnutrition.  Cardiology was consulted who recommended treating malnutrition to improve sinus bradycardia.  New events last 24 hours / Subjective: No new complaints today. Off [place] O2. About to eat breakfast (clear liquid diet ordered). Denies any significant abdminal pain. No N/V.   Assessment & Plan:   Principal Problem:   SBO (small bowel obstruction) (HCC) Active Problems:   CANCER, ENDOMETRIUM   FISTULA, ANAL   Abdominal pain, generalized   Insomnia   Colostomy in place (HCC)   Nausea   Abdominal distension   Abnormal CT of the abdomen   Hypokalemia   Hypoalbuminemia   Severe protein-calorie malnutrition (HCC)   Microcytic anemia   Acute renal failure superimposed on stage 3 chronic kidney disease (HCC)   Hypotension   Small bowel obstruction, suspect secondary to adhesions from multiple abdominal surgeries -General surgery following -NGT removed -Advanced to full liquid diet today   Intra-abdominal abscess versus necrotic mass/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 Drainage deferred -Completed 8 days of antibiotics 7/11-7/16 Zosyn, 7/16-7/18 Unasyn   Bilateral pleural effusion  -CXR independently reviewed 7/17, showed bilateral interstitial edema with small bilateral pleural effusions. Improved with IV lasix  -Continue IV lasix daily   Asymptomatic sinus bradycardia  -Cardiology consulted, suspects 2/2 to severe malnutrition. Now signed off.  -Resolved. Normal sinus rate 60s this morning   Severe protein calorie malnutrition -TPN per pharmacy until oral intake stable and adequate   CKD 4 -Baseline Cr appears to be 1.5 -Stable  Hx of endometrial cancer s/p radiation  -Radiation done more than 30 years ago  Hx of colon cancer s/p resection and colostomy  Hx of nephrolithiasis s/p nephrostomy tube placement  Chronic anemia/iron deficiency anemia -FOBT negative -Status post 2 units PRBC transfusion -Hemoglobin 6.1 on presentation -Transfused 2 units PRBC on 02/16/2018 -Hg stable  Cholelithiasis/choledocholithiasis -No sign of obstructive jaundice or transaminitis -GI will follow outpatient   DVT prophylaxis: Subq hep  Code Status: Full Family Communication: No family at bedside Disposition Plan: Pending clinical improvement, PT OT eval rec SNF    Consultants:   General surgery  Cardiology  Radiation oncology  Gyn oncology  Palliative care team   IR  Procedures:   PICC 7/15   Antimicrobials:  Anti-infectives (From admission, onward)   Start     Dose/Rate Route Frequency Ordered Stop   02/20/18 1600  ampicillin-sulbactam (UNASYN) 1.5 g in sodium chloride 0.9 % 100 mL IVPB     1.5 g  200 mL/hr over 30  Minutes Intravenous Every 12 hours 02/20/18 1134 02/22/18 0531   02/16/18 0400  piperacillin-tazobactam (ZOSYN) IVPB 2.25 g  Status:  Discontinued     2.25 g 100 mL/hr over 30 Minutes Intravenous Every 6 hours 02/16/18 0009 02/20/18 1134   02/15/18 2215  piperacillin-tazobactam (ZOSYN) IVPB 3.375 g     3.375 g 100 mL/hr over 30 Minutes Intravenous  Once 02/15/18 2202 02/15/18 2333       Objective: Vitals:   02/24/18 0400 02/24/18 0409 02/24/18 0425 02/24/18 0706  BP: (!) 112/38     Pulse: 68  71   Resp: (!) 22  (!) 27   Temp:  98.2 F (36.8 C)  98.7 F (37.1 C)  TempSrc:  Oral  Oral  SpO2: 95%  95%   Weight:      Height:        Intake/Output Summary (Last 24 hours) at 02/24/2018 0826 Last data filed at 02/24/2018 0655 Gross per 24 hour  Intake 1149.84 ml  Output 3325 ml  Net -2175.16 ml   Filed Weights   02/15/18 1740 02/16/18 0942 02/18/18 1130  Weight: 41.7 kg (92 lb) 40 kg (88 lb 2.9 oz) 41.7 kg (92 lb)   Examination: General exam: Appears calm and comfortable  Respiratory system: Clear to auscultation anteriorly. Respiratory effort normal. Cardiovascular system: S1 & S2 heard, RRR. No JVD, murmurs, rubs, gallops or clicks. No pedal edema. Gastrointestinal system: Abdomen is nondistended, soft and nontender. No organomegaly or masses felt. Normal bowel sounds heard. +colostomy  GU: Bilateral nephrostomy present with clear yellow urine  Central nervous system: Alert and oriented. No focal neurological deficits. Extremities: Symmetric 5 x 5 power. Skin: No rashes, lesions or ulcers Psychiatry: Judgement and insight appear normal. Mood & affect appropriate.    Data Reviewed: I have personally reviewed following labs and imaging studies  CBC: Recent Labs  Lab 02/20/18 0316 02/21/18 0305 02/21/18 1352 02/22/18 0422 02/24/18 0420  WBC 7.0 7.0 6.9 7.9 5.2  NEUTROABS 5.6  --   --   --   --   HGB 10.5* 9.4* 9.8* 10.1* 9.4*  HCT 35.2* 30.7* 32.2* 32.1* 30.0*  MCV  80.2 78.9 77.8* 77.5* 77.9*  PLT 202 135* 140* 159 443*   Basic Metabolic Panel: Recent Labs  Lab 02/21/18 0305 02/21/18 1352 02/22/18 0422 02/23/18 0240 02/24/18 0420  NA 142 139 137 132* 135  K 3.5 3.9 3.0* 3.9 3.5  CL 115* 111 106 104 105  CO2 21* 21* 23 24 25   GLUCOSE 139* 159* 122* 104* 103*  BUN 18 19 20 20  25*  CREATININE 1.38* 1.33* 1.33* 1.29* 1.25*  CALCIUM 8.8* 8.6* 8.4* 8.5* 8.4*  MG 1.6* 2.0 1.8 1.8 1.7  PHOS 2.0* 4.1 2.9 2.4* 3.5   GFR: Estimated Creatinine Clearance: 20.5 mL/min (A) (by C-G formula based on SCr of 1.25 mg/dL (H)). Liver Function Tests: Recent Labs  Lab 02/20/18 0316 02/21/18 0305 02/21/18 1352 02/22/18 0422  AST 20 21 18 17   ALT 13 14 15 14   ALKPHOS 78 75 79 76  BILITOT 0.6 0.4 0.4 0.4  PROT 5.4* 4.8* 5.2* 5.0*  ALBUMIN 2.1* 1.8* 2.0* 1.9*   No results for input(s): LIPASE, AMYLASE in the last 168 hours. No results for input(s): AMMONIA in the last 168 hours. Coagulation Profile: No results for input(s): INR, PROTIME in the last 168 hours. Cardiac Enzymes: Recent Labs  Lab 02/21/18 1352 02/21/18 2007  TROPONINI 0.10* 0.10*   BNP (  last 3 results) No results for input(s): PROBNP in the last 8760 hours. HbA1C: No results for input(s): HGBA1C in the last 72 hours. CBG: Recent Labs  Lab 02/22/18 1555 02/22/18 2111 02/23/18 0621 02/23/18 1653 02/23/18 2107  GLUCAP 127* 99 103* 110* 101*   Lipid Profile: No results for input(s): CHOL, HDL, LDLCALC, TRIG, CHOLHDL, LDLDIRECT in the last 72 hours. Thyroid Function Tests: No results for input(s): TSH, T4TOTAL, FREET4, T3FREE, THYROIDAB in the last 72 hours. Anemia Panel: No results for input(s): VITAMINB12, FOLATE, FERRITIN, TIBC, IRON, RETICCTPCT in the last 72 hours. Sepsis Labs: Recent Labs  Lab 02/21/18 1352  LATICACIDVEN 1.7    Recent Results (from the past 240 hour(s))  Blood culture (routine x 2)     Status: None   Collection Time: 02/15/18 10:20 PM  Result  Value Ref Range Status   Specimen Description   Final    BLOOD BLOOD RIGHT FOREARM Performed at [facility] [address], [place], [place] [address]    Special Requests   Final    BOTTLES DRAWN AEROBIC AND ANAEROBIC Blood Culture adequate volume Performed at [facility] [address] [place], [place] [address]    Culture   Final    NO GROWTH 5 DAYS Performed at [facility], [employer] [place] [employer] [address] [place], [place] [address]    Report Status 02/21/2018 FINAL  Final  Blood culture (routine x 2)     Status: None   Collection Time: 02/15/18 10:25 PM  Result Value Ref Range Status   Specimen Description   Final    BLOOD RIGHT ANTECUBITAL Performed at [facility] [address], [place], [place] [address]    Special Requests   Final    BOTTLES DRAWN AEROBIC AND ANAEROBIC Blood Culture adequate volume Performed at [facility] [address] [place], [place] [address]    Culture   Final    NO GROWTH 5 DAYS Performed at [facility], [place] [address], [place], [place] [address]    Report Status 02/21/2018 FINAL  Final  MRSA PCR Screening     Status: None   Collection Time: 02/18/18 12:50 PM  Result Value Ref Range Status   MRSA by PCR NEGATIVE NEGATIVE Final    Comment:        The GeneXpert MRSA Assay (FDA approved for NASAL specimens only), is one component of a comprehensive MRSA colonization surveillance program. It is not intended to diagnose MRSA infection nor to guide or monitor treatment for MRSA infections. Performed at [facility], [place] [address], [place], [place] [address]   Body fluid culture     Status: None   Collection Time: 02/18/18  3:14 PM  Result Value Ref Range Status   Specimen Description   Final    HEMODIALYSIS FISTULA Performed at [facility], [place] [address], [place], [place] [address]    Special Requests   Final     Normal Performed at [facility], [place] [address] [place], [place] [address]    Gram Stain   Final    ABUNDANT WBC PRESENT, PREDOMINANTLY PMN ABUNDANT GRAM VARIABLE ROD Performed at [facility], [place] [address] [place], [place] [address]    Culture FEW ESCHERICHIA COLI FEW STREPTOCOCCUS GROUP F   Final   Report Status 02/22/2018 FINAL  Final   Organism ID, Bacteria ESCHERICHIA COLI  Final      Susceptibility  Escherichia coli - MIC*    AMPICILLIN 4 SENSITIVE Sensitive     CEFAZOLIN <=4 SENSITIVE Sensitive     CEFEPIME <=1 SENSITIVE Sensitive     CEFTAZIDIME <=1 SENSITIVE Sensitive     CEFTRIAXONE <=1 SENSITIVE Sensitive     CIPROFLOXACIN <=0.25 SENSITIVE Sensitive     GENTAMICIN <=1 SENSITIVE Sensitive     IMIPENEM <=0.25 SENSITIVE Sensitive     TRIMETH/SULFA <=20 SENSITIVE Sensitive     AMPICILLIN/SULBACTAM <=2 SENSITIVE Sensitive     PIP/TAZO <=4 SENSITIVE Sensitive     Extended ESBL NEGATIVE Sensitive     * FEW ESCHERICHIA COLI       Radiology Studies: Ct Abdomen Pelvis W Contrast  Addendum Date: 02/23/2018   ADDENDUM REPORT: 02/23/2018 14:57 ADDENDUM: The original report was by Dr. Van Clines. The following addendum is by Dr. Van Clines: I reviewed this case on February 23, 2018 in consultation with Dr. Corrie Mckusick. Our consensus after review of the complex pelvic appearance was that the irregular rim enhancing structure posterior to the severely inflamed urinary bladder likely reflects a distended and markedly inflamed vaginal vault rather than a separate abscess or mass. Based on sagittal images such as image 53/6, the possibility of a wide connection between the lower rectal lumen and this severely inflamed vagina is suspected, from a large fistula or breakdown of the wall between the rectum and vagina. The enhancing 2.6 by 2.4 cm structure in the right hemipelvis mentioned in the original report appears to be separate from the vagina,  and is near the expected course of the severely inflamed in indistinct right distal ureter. Electronically Signed   By: Van Clines M.D.   On: 02/23/2018 14:57   Result Date: 02/23/2018 CLINICAL DATA:  Anal fissure.  Abscess. EXAM: CT ABDOMEN AND PELVIS WITH CONTRAST TECHNIQUE: Multidetector CT imaging of the abdomen and pelvis was performed using the standard protocol following bolus administration of intravenous contrast. CONTRAST:  42mL OMNIPAQUE IOHEXOL 300 MG/ML SOLN, 9mL ISOVUE-300 IOPAMIDOL (ISOVUE-300) INJECTION 61% COMPARISON:  CT scan 02/15/2018 FINDINGS: Lower chest: Large bilateral pleural effusions with passive atelectasis. Hepatobiliary: Gallbladder wall thickening with 2.4 cm in length filling defect in the gallbladder favoring gallstone. Intrahepatic biliary dilatation, with ill definition of the extrahepatic biliary tree but with suspicion for multiple filling defects in the common bile duct favoring choledocholithiasis. 5 mm hypodense lesion in the dome of segment 4a of the liver on image 10/2, nonspecific related to small size. Pancreas: There is diffuse mesenteric edema and this does not appear the peripancreatic region. Strictly speaking I cannot exclude acute pancreatitis. Pseudocyst, pancreatic abscess, or pancreatic necrosis. Correlate with lipase levels. Spleen: Unremarkable Adrenals/Urinary Tract: Both adrenal glands appear unremarkable. There is scarring of the right anterior kidney. Bilateral nephrostomy tubes are present with tubing coiled in the renal collecting systems. There are several devices along the urinary tract which may represent ureteral expanders, although correlation with patient history is recommended with respect to these devices. One of these is in the left mid kidney on image 56/5. There is a device in the right mid ureter on image 40/5 and another similar device just past this in the right mid ureter on image 45/5. A fourth such device is present in the left  distal ureter on image 53/5. Correlate with urologic history. The right proximal ureter is expanded and indistinct. The midsection of the ureter is completely obscured by surrounding soft tissue densities, and both distal ureters are extremely indistinct and cannot be readily  followed due to surrounding soft tissue densities. A structure believed to represent the urinary bladder on image 54/6 contains gas and complex fluid/material, and has accentuated enhancement along its mucosal margin and prominently indistinct peripheral wall margins due to surrounding edema. Stomach/Bowel: Probable contraction in the stomach body causing the circumferential wall thickening on image 28/2. The duodenum is somewhat displaced to the right but crosses back to midline with ligament of Treitz in the appropriate position. There are multiple scattered air-fluid levels in dilated loops of small bowel measuring up to about 3.6 cm in diameter. There is wall thickening especially down in the pelvis, with a right pelvic loop of small bowel terminating in the indistinct soft tissue density in the pelvis on 56/2. There is quite likely obstruction in this vicinity. By report the patient has had a low anterior resection. A left ostomy is present. There is a somewhat long mucous fistula extending down towards the pelvis, with the walls of this structure severely indistinct. There appears to be some dilute contrast medium within this presumed blind-ending pouch, and a small amount of contrast in the thickened rectum on image 60/2. The rectum is notably effaced in the vicinity of a 3.1 by 5.4 by 5.5 cm (volume = 48 cm^3) rim enhancing hypodense collection in the anorectal region posterior to the urinary bladder, suspicious for abscess or tumor. This process extends down towards the perineum. There is also an appearance suspicious for either tumor, abscess, or extravasation in the right hemipelvis in the perirectal region measuring about 2.6 by 2.4  by 2.8 cm (volume = 9.1 cm^3) on image 57/2. Vascular/Lymphatic: Aortoiliac atherosclerotic vascular disease. Aortocaval node 1.0 cm in short axis on image 26/2. The portal vein appears patent. Reproductive: Prior hysterectomy. Severe indistinctness of tissue planes in the pelvis, potentially at least partially due to remote radiation therapy. Ovaries not well seen. Other: Mild ascites. Diffuse edema throughout the omentum mesentery, especially confluent in the pelvis where there is poor delineation of fascial planes due to the degree of edema. Subcutaneous edema is also present. Musculoskeletal: Lipoma anterior to the left hip. Prominent bilateral degenerative hip arthropathy. Diffuse bony demineralization. Grade 1 degenerative anterolisthesis at L4-5. Presacral edema. IMPRESSION: 1. Highly complex appearance in the pelvis due to indistinctness fat planes, related to a combination of postoperative, post radiation therapy, and inflammatory findings. 2. There appears to be an 48 cubic cm abscess or tumor between the bladder and in the rectal region, extending towards the perineum. There is also a focus of tumor, abscess, or perforation of the mucous fistula in the right hemipelvis in the perirectal region measuring about 9 cubic cm. 3. Bilateral nephrostomy tubes noted, with various devices in the left collecting system and bilateral ureters probably representing ureteral expanders. Correlate with urologic history. The distal halves of both ureters are severely indistinct. 4. Extensive third spacing of fluid with subcutaneous edema, large bilateral pleural effusions, some ascites, and diffuse mesenteric and omental edema. 5. Mildly dilated loops of small bowel with air-fluid levels suspicious for mild small bowel obstruction. A distal small bowel loop extending into the pelvis has a severely effaced segment and may be the lead point for obstruction eccentric to the right for example on image 56/2. 6. Large gallstone  with gallbladder wall thickening. Multiple filling defects in the extrahepatic biliary tree suspicious for choledocholithiasis. Intrahepatic biliary dilatation results. 7. Peripancreatic edema is not disproportionate to the mesenteric edema elsewhere, but acute uncomplicated pancreatitis cannot be excluded. 8. Mildly enlarged aortocaval lymph node.  9. Other imaging findings of potential clinical significance: Aortic Atherosclerosis (ICD10-I70.0). Prominent bilateral degenerative hip arthropathy. Bony demineralization. Lipoma anterior to the left hip. Electronically Signed: By: Van Clines M.D. On: 02/22/2018 18:48      Scheduled Meds: . chlorhexidine  15 mL Mouth Rinse BID  . Chlorhexidine Gluconate Cloth  6 each Topical Daily  . furosemide  40 mg Intravenous Daily  . heparin injection (subcutaneous)  5,000 Units Subcutaneous Q8H  . insulin aspart  0-9 Units Subcutaneous 3 times per day  . mouth rinse  15 mL Mouth Rinse BID  . sodium chloride flush  10-40 mL Intracatheter Q12H  . sodium chloride flush  3 mL Intravenous Q12H   Continuous Infusions: . Marland KitchenTPN (CLINIMIX-E) Adult 50 mL/hr at 02/24/18 0300  . Marland KitchenTPN (CLINIMIX-E) Adult     And  . Fat emulsion    . sodium chloride 10 mL/hr at 02/24/18 0816  . magnesium sulfate 1 - 4 g bolus IVPB 1 g (02/24/18 0809)     LOS: 8 days    Time spent: 25 minutes   Dessa Phi, DO [facility] [email or web address] Password [facility] 02/24/2018, 8:26 AM

## 2018-02-24 NOTE — Progress Notes (Signed)
PHARMACY - ADULT TOTAL PARENTERAL NUTRITION CONSULT NOTE   Pharmacy Consult for TPN Indication: SBO  Patient Measurements: Height: 5\' 4"  (162.6 cm) Weight: 92 lb (41.7 kg) IBW/kg (Calculated) : 54.7 TPN AdjBW (KG): 41.7 Body mass index is 15.79 kg/m. Usual Weight: 45kg  Insulin Requirements: 0 unit novolog in past 24hr  Current Nutrition: NPO 7/19 for possible procedure, back to clear liquids  IVF: NS at [facility] access: Double lumen PICC  TPN start date: 7/16  ASSESSMENT                                                                                                          HPI: 84 yo female admitted 7/11 with worsening abdominal pain and distension since last week.CT abd pelvis w no contrast done on admission revealed SBO and suspected intra abdominal abscess vs necrotic mass. PMH includes endometrial cancer s/p hysterectomy, sigmoidal cancer s/p partial bowel resection, ileostomy and colostomy bag placement, nephrolithiasis s/p bilateral nephrostomy tube placement, hyponatremia, severe malnutrition, and insomnia.  CCS recommends to continue NPO, NGT to suction and begin TPN.  PICC ordered.  Significant events:  7/16: Start TPN. NGT removed. 7/18: Advance to clear liquids. Abdominal CT shows pelvic abscess, no intervention per IR.  Today:    Glucose: No hx DM, CBGs at goal (< 150)  Electrolytes: K at low end of goal; Phos now WNL after replacement; Mag at low end of goal; corrected Ca normal.  Renal: CKD 4 (baseline SCr ~1.5), SCr 1.92 on admit, down 1.25 today. UOP 3130 ml/24hr on IV lasix daily.  LFTs 7/18: AST/ALT WNL, Tbili elevated at 1.6 on 7/13 but down to normal.  TGs: 101 (7/16)  Prealbumin: 10.2 (7/16)  NUTRITIONAL GOALS                                                                                             RD recs on 7/15:  65-80 g/day protein, 1400-1600 kcal/day Clinimix / at a goal rate of 37ml/hr + 20% fat emulsion at 63ml/hr to  provide: 72g/day protein, 1502Kcal/day.  PLAN  Now: KCl 74meq PO x 1, Magnesium sulfate 1g IV x 1 per MD    Continue Clinimix E 5/15 to 92ml/hr - have been slowly advancing due to electrolyte abnormalities/refeeding  20% fat emulsion at 61ml/hr x 12 hrs.  Plan to advance as tolerated to the goal rate if does not tolerate diet advancement.  TPN to contain standard multivitamins and trace elements.  TPN to contain famotidine 20mg  (renally adjusted for CrCl < 80ml/min)  Discontinue IVF per MD.  Continue SSI sensitive scale q8h .   TPN lab panels on Mondays & Thursdays.  BMP, magnesium, phosphorus with AM labs   F/u daily.   Peggyann Juba, PharmD, BCPS Pager: [phone] 02/24/2018 7:16 AM

## 2018-02-24 NOTE — Progress Notes (Signed)
Subjective/Chief Complaint: Complains of intermittent abd pain. Hungry and wants to eat   Objective: Vital signs in last 24 hours: Temp:  [97.6 F (36.4 C)-98.9 F (37.2 C)] 98.7 F (37.1 C) (07/20 0706) Pulse Rate:  [59-75] 71 (07/20 0425) Resp:  [15-58] 27 (07/20 0425) BP: (102-127)/(32-57) 112/38 (07/20 0400) SpO2:  [95 %-98 %] 95 % (07/20 0425) Last BM Date: 02/23/18  Intake/Output from previous day: 07/19 0701 - 07/20 0700 In: 1229.8 [I.V.:1229.8] Out: 9390 [Urine:3130; Stool:400] Intake/Output this shift: No intake/output data recorded.  General appearance: alert and cooperative Resp: clear to auscultation bilaterally Cardio: regular rate and rhythm GI: soft, nontender. ostomy productive  Lab Results:  Recent Labs    02/22/18 0422 02/24/18 0420  WBC 7.9 5.2  HGB 10.1* 9.4*  HCT 32.1* 30.0*  PLT 159 146*   BMET Recent Labs    02/23/18 0240 02/24/18 0420  NA 132* 135  K 3.9 3.5  CL 104 105  CO2 24 25  GLUCOSE 104* 103*  BUN 20 25*  CREATININE 1.29* 1.25*  CALCIUM 8.5* 8.4*   PT/INR No results for input(s): LABPROT, INR in the last 72 hours. ABG No results for input(s): PHART, HCO3 in the last 72 hours.  Invalid input(s): PCO2, PO2  Studies/Results: Ct Abdomen Pelvis W Contrast  Addendum Date: 02/23/2018   ADDENDUM REPORT: 02/23/2018 14:57 ADDENDUM: The original report was by Dr. Van Clines. The following addendum is by Dr. Van Clines: I reviewed this case on February 23, 2018 in consultation with Dr. Corrie Mckusick. Our consensus after review of the complex pelvic appearance was that the irregular rim enhancing structure posterior to the severely inflamed urinary bladder likely reflects a distended and markedly inflamed vaginal vault rather than a separate abscess or mass. Based on sagittal images such as image 53/6, the possibility of a wide connection between the lower rectal lumen and this severely inflamed vagina is suspected, from a  large fistula or breakdown of the wall between the rectum and vagina. The enhancing 2.6 by 2.4 cm structure in the right hemipelvis mentioned in the original report appears to be separate from the vagina, and is near the expected course of the severely inflamed in indistinct right distal ureter. Electronically Signed   By: Van Clines M.D.   On: 02/23/2018 14:57   Result Date: 02/23/2018 CLINICAL DATA:  Anal fissure.  Abscess. EXAM: CT ABDOMEN AND PELVIS WITH CONTRAST TECHNIQUE: Multidetector CT imaging of the abdomen and pelvis was performed using the standard protocol following bolus administration of intravenous contrast. CONTRAST:  96mL OMNIPAQUE IOHEXOL 300 MG/ML SOLN, 39mL ISOVUE-300 IOPAMIDOL (ISOVUE-300) INJECTION 61% COMPARISON:  CT scan 02/15/2018 FINDINGS: Lower chest: Large bilateral pleural effusions with passive atelectasis. Hepatobiliary: Gallbladder wall thickening with 2.4 cm in length filling defect in the gallbladder favoring gallstone. Intrahepatic biliary dilatation, with ill definition of the extrahepatic biliary tree but with suspicion for multiple filling defects in the common bile duct favoring choledocholithiasis. 5 mm hypodense lesion in the dome of segment 4a of the liver on image 10/2, nonspecific related to small size. Pancreas: There is diffuse mesenteric edema and this does not appear the peripancreatic region. Strictly speaking I cannot exclude acute pancreatitis. Pseudocyst, pancreatic abscess, or pancreatic necrosis. Correlate with lipase levels. Spleen: Unremarkable Adrenals/Urinary Tract: Both adrenal glands appear unremarkable. There is scarring of the right anterior kidney. Bilateral nephrostomy tubes are present with tubing coiled in the renal collecting systems. There are several devices along the urinary tract which may represent  ureteral expanders, although correlation with patient history is recommended with respect to these devices. One of these is in the left  mid kidney on image 56/5. There is a device in the right mid ureter on image 40/5 and another similar device just past this in the right mid ureter on image 45/5. A fourth such device is present in the left distal ureter on image 53/5. Correlate with urologic history. The right proximal ureter is expanded and indistinct. The midsection of the ureter is completely obscured by surrounding soft tissue densities, and both distal ureters are extremely indistinct and cannot be readily followed due to surrounding soft tissue densities. A structure believed to represent the urinary bladder on image 54/6 contains gas and complex fluid/material, and has accentuated enhancement along its mucosal margin and prominently indistinct peripheral wall margins due to surrounding edema. Stomach/Bowel: Probable contraction in the stomach body causing the circumferential wall thickening on image 28/2. The duodenum is somewhat displaced to the right but crosses back to midline with ligament of Treitz in the appropriate position. There are multiple scattered air-fluid levels in dilated loops of small bowel measuring up to about 3.6 cm in diameter. There is wall thickening especially down in the pelvis, with a right pelvic loop of small bowel terminating in the indistinct soft tissue density in the pelvis on 56/2. There is quite likely obstruction in this vicinity. By report the patient has had a low anterior resection. A left ostomy is present. There is a somewhat long mucous fistula extending down towards the pelvis, with the walls of this structure severely indistinct. There appears to be some dilute contrast medium within this presumed blind-ending pouch, and a small amount of contrast in the thickened rectum on image 60/2. The rectum is notably effaced in the vicinity of a 3.1 by 5.4 by 5.5 cm (volume = 48 cm^3) rim enhancing hypodense collection in the anorectal region posterior to the urinary bladder, suspicious for abscess or tumor.  This process extends down towards the perineum. There is also an appearance suspicious for either tumor, abscess, or extravasation in the right hemipelvis in the perirectal region measuring about 2.6 by 2.4 by 2.8 cm (volume = 9.1 cm^3) on image 57/2. Vascular/Lymphatic: Aortoiliac atherosclerotic vascular disease. Aortocaval node 1.0 cm in short axis on image 26/2. The portal vein appears patent. Reproductive: Prior hysterectomy. Severe indistinctness of tissue planes in the pelvis, potentially at least partially due to remote radiation therapy. Ovaries not well seen. Other: Mild ascites. Diffuse edema throughout the omentum mesentery, especially confluent in the pelvis where there is poor delineation of fascial planes due to the degree of edema. Subcutaneous edema is also present. Musculoskeletal: Lipoma anterior to the left hip. Prominent bilateral degenerative hip arthropathy. Diffuse bony demineralization. Grade 1 degenerative anterolisthesis at L4-5. Presacral edema. IMPRESSION: 1. Highly complex appearance in the pelvis due to indistinctness fat planes, related to a combination of postoperative, post radiation therapy, and inflammatory findings. 2. There appears to be an 48 cubic cm abscess or tumor between the bladder and in the rectal region, extending towards the perineum. There is also a focus of tumor, abscess, or perforation of the mucous fistula in the right hemipelvis in the perirectal region measuring about 9 cubic cm. 3. Bilateral nephrostomy tubes noted, with various devices in the left collecting system and bilateral ureters probably representing ureteral expanders. Correlate with urologic history. The distal halves of both ureters are severely indistinct. 4. Extensive third spacing of fluid with subcutaneous edema, large bilateral  pleural effusions, some ascites, and diffuse mesenteric and omental edema. 5. Mildly dilated loops of small bowel with air-fluid levels suspicious for mild small bowel  obstruction. A distal small bowel loop extending into the pelvis has a severely effaced segment and may be the lead point for obstruction eccentric to the right for example on image 56/2. 6. Large gallstone with gallbladder wall thickening. Multiple filling defects in the extrahepatic biliary tree suspicious for choledocholithiasis. Intrahepatic biliary dilatation results. 7. Peripancreatic edema is not disproportionate to the mesenteric edema elsewhere, but acute uncomplicated pancreatitis cannot be excluded. 8. Mildly enlarged aortocaval lymph node. 9. Other imaging findings of potential clinical significance: Aortic Atherosclerosis (ICD10-I70.0). Prominent bilateral degenerative hip arthropathy. Bony demineralization. Lipoma anterior to the left hip. Electronically Signed: By: Van Clines M.D. On: 02/22/2018 18:48    Anti-infectives: Anti-infectives (From admission, onward)   Start     Dose/Rate Route Frequency Ordered Stop   02/20/18 1600  ampicillin-sulbactam (UNASYN) 1.5 g in sodium chloride 0.9 % 100 mL IVPB     1.5 g 200 mL/hr over 30 Minutes Intravenous Every 12 hours 02/20/18 1134 02/22/18 0531   02/16/18 0400  piperacillin-tazobactam (ZOSYN) IVPB 2.25 g  Status:  Discontinued     2.25 g 100 mL/hr over 30 Minutes Intravenous Every 6 hours 02/16/18 0009 02/20/18 1134   02/15/18 2215  piperacillin-tazobactam (ZOSYN) IVPB 3.375 g     3.375 g 100 mL/hr over 30 Minutes Intravenous  Once 02/15/18 2202 02/15/18 2333      Assessment/Plan: s/p * No surgery found * Advance diet. Allow fulls since IR is not going to place drain Hx of colon cancer Hx of endometrial cancer Radiation fibrosis Anemia of chronic disease - H/H 10.1/32.1 yesterday Severe protein calorie malnutrition Ureteral stenosis and hx of nephrolithiasis - b/l nephrostomy tubes present Depression  Choledocholithiasis - outpatient GI follow up, no signs of obstruction or cholangitis  SBO- multiple past abdominal  surgeries, likely due to adhesive disease - CT 7/11: sbo with transition within the pelvis, likely adhesions with ?mass in pelvis - on gyn/onc exam - found large cavity in the pelvis with open vaginal cuff - presumably from prior radiation? -xray 7/17 normal. Continue TPN until reliably tolerating regular diet for 24hrs - patient had > 1L stool output from colostomy overnight  Intra-abdominal abscess vs necrotic mass - CT 7/11: ill-defined collection within pelvis, anterior to rectum and posterior to bladder. Possible fistulous tract extending to collection - CT 7/18: 1. Highly complex appearance in the pelvis due to indistinctness fat planes, related to a combination of postoperative, post radiation therapy, and inflammatory findings. 2. There appears to be an 48 cubic cm abscess or tumor between the bladder and in the rectal region, extending towards the perineum. There is also a focus of tumor, abscess, or perforation of the mucous fistula in the right hemipelvis in the perirectal region measuring about 9 cubic cm. - WBC normal and patient afebrile    LOS: 8 days    TOTH III,PAUL S 02/24/2018

## 2018-02-24 NOTE — Evaluation (Signed)
Physical Therapy Evaluation Patient Details Name: [name] MRN: [id number] DOB: 03/29/1928 Today's Date: 02/24/2018   History of Present Illness  Patient is an 84 y/o female admitted for SBO.  PMH:  endometrial and colon CA, and low back pain  Clinical Impression  Patient presents with decreased independence with mobility due to generalized weakness and bedrest.  She will benefit from skilled PT in the acute setting to address issues and allow d/c to SNF level rehab prior to d/c home.     Follow Up Recommendations SNF;Supervision/Assistance - 24 hour    Equipment Recommendations  None recommended by PT    Recommendations for Other Services       Precautions / Restrictions Precautions Precautions: Fall Precaution Comments: bilat nephrostomy tubes Restrictions Weight Bearing Restrictions: No      Mobility  Bed Mobility Overal bed mobility: Needs Assistance Bed Mobility: Supine to Sit   Sidelying to sit: Mod assist       General bed mobility comments: assist for trunk and legs  Transfers Overall transfer level: Needs assistance Equipment used: Standard walker Transfers: Sit to/from Stand;Stand Pivot Transfers Sit to Stand: Mod assist Stand pivot transfers: Mod assist       General transfer comment: lifting help from EOB, able to take steps with weak legs with SW assist to move walker  Ambulation/Gait             General Gait Details: NT due to weakness  Stairs            Wheelchair Mobility    Modified Rankin (Stroke Patients Only)       Balance Overall balance assessment: Needs assistance   Sitting balance-Leahy Scale: Fair Sitting balance - Comments: able to sit unaided, but fatigues quickly and lies back while PT getting environment set up                                     Pertinent Vitals/Pain Pain Assessment: No/denies pain    Home Living Family/patient expects to be discharged to:: Private residence Living  Arrangements: Alone Available Help at Discharge: Family;Available 24 hours/day Type of Home: House Home Access: Stairs to enter Entrance Stairs-Rails: Right Entrance Stairs-Number of Steps: 4 Home Layout: One level Home Equipment: None Additional Comments: son lives next door; states son and grandaughter can provide 24 hour assist    Prior Function Level of Independence: Independent               Hand Dominance        Extremity/Trunk Assessment   Upper Extremity Assessment Upper Extremity Assessment: Defer to OT evaluation    Lower Extremity Assessment Lower Extremity Assessment: Generalized weakness(very thin and frail)       Communication   Communication: No difficulties  Cognition Arousal/Alertness: Awake/alert Behavior During Therapy: WFL for tasks assessed/performed Overall Cognitive Status: Within Functional Limits for tasks assessed                                        General Comments      Exercises     Assessment/Plan    PT Assessment Patient needs continued PT services  PT Problem List Decreased strength;Decreased mobility;Decreased balance;Decreased knowledge of use of DME;Decreased activity tolerance;Decreased knowledge of precautions       PT Treatment Interventions DME instruction;Therapeutic activities;Therapeutic  exercise;Gait training;Patient/family education;Balance training;Functional mobility training    PT Goals (Current goals can be found in the Care Plan section)  Acute Rehab PT Goals Patient Stated Goal: get strength back PT Goal Formulation: With patient Time For Goal Achievement: 03/10/18 Potential to Achieve Goals: Fair    Frequency Min 3X/week   Barriers to discharge        Co-evaluation               AM-PAC PT "6 Clicks" Daily Activity  Outcome Measure Difficulty turning over in bed (including adjusting bedclothes, sheets and blankets)?: A Lot Difficulty moving from lying on back to sitting  on the side of the bed? : Unable Difficulty sitting down on and standing up from a chair with arms (e.g., wheelchair, bedside commode, etc,.)?: Unable Help needed moving to and from a bed to chair (including a wheelchair)?: A Lot Help needed walking in hospital room?: Total Help needed climbing 3-5 steps with a railing? : Total 6 Click Score: 8    End of Session Equipment Utilized During Treatment: Gait belt Activity Tolerance: Patient limited by fatigue Patient left: in chair;with chair alarm set;with call bell/phone within reach Nurse Communication: Mobility status PT Visit Diagnosis: Muscle weakness (generalized) (M62.81);Other abnormalities of gait and mobility (R26.89)    Time: 1145-1210 PT Time Calculation (min) (ACUTE ONLY): 25 min   Charges:   PT Evaluation $PT Eval Moderate Complexity: 1 Mod PT Treatments $Therapeutic Activity: 8-22 mins   PT G CodesMagda Kiel, [place] [phone] 02/24/2018   Reginia Naas 02/24/2018, 2:44 PM

## 2018-02-25 LAB — BASIC METABOLIC PANEL
ANION GAP: 5 (ref 5–15)
BUN: 30 mg/dL — ABNORMAL HIGH (ref 8–23)
CO2: 25 mmol/L (ref 22–32)
Calcium: 8.8 mg/dL — ABNORMAL LOW (ref 8.9–10.3)
Chloride: 104 mmol/L (ref 98–111)
Creatinine, Ser: 1.4 mg/dL — ABNORMAL HIGH (ref 0.44–1.00)
GFR, EST AFRICAN AMERICAN: 38 mL/min — AB (ref >60)
GFR, EST NON AFRICAN AMERICAN: 32 mL/min — AB (ref >60)
GLUCOSE: 106 mg/dL — AB (ref 70–99)
Potassium: 4 mmol/L (ref 3.5–5.1)
Sodium: 134 mmol/L — ABNORMAL LOW (ref 135–145)

## 2018-02-25 LAB — GLUCOSE, CAPILLARY
GLUCOSE-CAPILLARY: 155 mg/dL — AB (ref 70–99)
GLUCOSE-CAPILLARY: 126 mg/dL — AB (ref 70–99)
Glucose-Capillary: 99 mg/dL (ref 70–99)
Glucose-Capillary: 132 mg/dL — ABNORMAL HIGH (ref 70–99)

## 2018-02-25 LAB — CBC
HCT: 29.7 % — ABNORMAL LOW (ref 36.0–46.0)
HEMOGLOBIN: 9.2 g/dL — AB (ref 12.0–15.0)
MCH: 24.3 pg — AB (ref 26.0–34.0)
MCHC: 31 g/dL (ref 30.0–36.0)
MCV: 78.6 fL (ref 78.0–100.0)
PLATELETS: 146 10*3/uL — AB (ref 150–400)
RBC: 3.78 MIL/uL — AB (ref 3.87–5.11)
RDW: 22.7 % — ABNORMAL HIGH (ref 11.5–15.5)
WBC: 5 10*3/uL (ref 4.0–10.5)

## 2018-02-25 LAB — MAGNESIUM: MAGNESIUM: 2 mg/dL (ref 1.7–2.4)

## 2018-02-25 LAB — PHOSPHORUS: PHOSPHORUS: 3.1 mg/dL (ref 2.5–4.6)

## 2018-02-25 MED ORDER — TRACE MINERALS CR-CU-MN-SE-ZN 10-1000-500-60 MCG/ML IV SOLN
INTRAVENOUS | Status: AC
  Administered 2018-02-25: 18:00:00 via INTRAVENOUS
  Filled 2018-02-25: qty 1200

## 2018-02-25 MED ORDER — ENSURE ENLIVE PO LIQD
237.0000 mL | Freq: Two times a day (BID) | ORAL | Status: DC
  Administered 2018-02-25 (×2): 237 mL via ORAL

## 2018-02-25 MED ORDER — INSULIN ASPART 100 UNIT/ML ~~LOC~~ SOLN
0.0000 [IU] | Freq: Three times a day (TID) | SUBCUTANEOUS | Status: DC
  Administered 2018-02-25: 1 [IU] via SUBCUTANEOUS
  Administered 2018-02-25: 2 [IU] via SUBCUTANEOUS

## 2018-02-25 MED ORDER — FAT EMULSION PLANT BASED 20 % IV EMUL
240.0000 mL | INTRAVENOUS | Status: AC
  Administered 2018-02-25: 240 mL via INTRAVENOUS
  Filled 2018-02-25: qty 250

## 2018-02-25 MED ORDER — SODIUM CHLORIDE 0.9 % IV BOLUS
1000.0000 mL | Freq: Once | INTRAVENOUS | Status: AC
  Administered 2018-02-25: 1000 mL via INTRAVENOUS

## 2018-02-25 NOTE — Progress Notes (Signed)
   Subjective/Chief Complaint: Notes when she drinks she feels a firm area that then goes away. No nausea or vomiting   Objective: Vital signs in last 24 hours: Temp:  [98.6 F (37 C)-99.8 F (37.7 C)] 99.1 F (37.3 C) (07/21 0352) Pulse Rate:  [61-82] 66 (07/21 0500) Resp:  [15-46] 18 (07/21 0500) BP: (70-111)/(21-70) 79/47 (07/21 0500) SpO2:  [91 %-97 %] 93 % (07/21 0500) Last BM Date: 02/24/18  Intake/Output from previous day: 07/20 0701 - 07/21 0700 In: 1457.3 [I.V.:1457.3] Out: 2970 [Urine:2070; Stool:900] Intake/Output this shift: No intake/output data recorded.  General appearance: alert and cooperative Resp: clear to auscultation bilaterally Cardio: regular rate and rhythm GI: soft, nontender. no distension  Lab Results:  Recent Labs    02/24/18 0420 02/25/18 0459  WBC 5.2 5.0  HGB 9.4* 9.2*  HCT 30.0* 29.7*  PLT 146* 146*   BMET Recent Labs    02/24/18 0420 02/25/18 0459  NA 135 134*  K 3.5 4.0  CL 105 104  CO2 25 25  GLUCOSE 103* 106*  BUN 25* 30*  CREATININE 1.25* 1.40*  CALCIUM 8.4* 8.8*   PT/INR No results for input(s): LABPROT, INR in the last 72 hours. ABG No results for input(s): PHART, HCO3 in the last 72 hours.  Invalid input(s): PCO2, PO2  Studies/Results: No results found.  Anti-infectives: Anti-infectives (From admission, onward)   Start     Dose/Rate Route Frequency Ordered Stop   02/20/18 1600  ampicillin-sulbactam (UNASYN) 1.5 g in sodium chloride 0.9 % 100 mL IVPB     1.5 g 200 mL/hr over 30 Minutes Intravenous Every 12 hours 02/20/18 1134 02/22/18 0531   02/16/18 0400  piperacillin-tazobactam (ZOSYN) IVPB 2.25 g  Status:  Discontinued     2.25 g 100 mL/hr over 30 Minutes Intravenous Every 6 hours 02/16/18 0009 02/20/18 1134   02/15/18 2215  piperacillin-tazobactam (ZOSYN) IVPB 3.375 g     3.375 g 100 mL/hr over 30 Minutes Intravenous  Once 02/15/18 2202 02/15/18 2333      Assessment/Plan: s/p * No surgery found  * Advance diet. Start soft diet and try ensure supplement or equivalent No sign of obstruction Possible fistula to vaginal cuff. Question whether she could tolerate surgery it would take to repair. Will follow Continue abx  LOS: 9 days    TOTH III,PAUL S 02/25/2018

## 2018-02-25 NOTE — Progress Notes (Signed)
PHARMACY - ADULT TOTAL PARENTERAL NUTRITION CONSULT NOTE   Pharmacy Consult for TPN Indication: SBO  Patient Measurements: Height: 5\' 4"  (162.6 cm) Weight: 92 lb (41.7 kg) IBW/kg (Calculated) : 54.7 TPN AdjBW (KG): 41.7 Body mass index is 15.79 kg/m. Usual Weight: 45kg  Insulin Requirements: 1 unit novolog in past 24hr  Current Nutrition: advanced to full liquids 7/20, Ensure BID added 7/21  IVF: NS at [facility] access: Double lumen PICC  TPN start date: 7/16  ASSESSMENT                                                                                                          HPI: 84 yo female admitted 7/11 with worsening abdominal pain and distension since last week.CT abd pelvis w no contrast done on admission revealed SBO and suspected intra abdominal abscess vs necrotic mass. PMH includes endometrial cancer s/p hysterectomy, sigmoidal cancer s/p partial bowel resection, ileostomy and colostomy bag placement, nephrolithiasis s/p bilateral nephrostomy tube placement, hyponatremia, severe malnutrition, and insomnia.  CCS recommends to continue NPO, NGT to suction and begin TPN.  PICC ordered.  Significant events:  7/16: Start TPN. NGT removed. 7/18: Advance to clear liquids. Abdominal CT shows pelvic abscess, no intervention per IR. 7/20: Advance to full liquids. Per surgery, continue TPN until reliably tolerating regular diet for 24hrs. 7/21: Advance to soft diet and add Ensure BID.  Today:    Glucose: No hx DM, CBGs at goal (< 150)  Electrolytes: K now WNL after supplementation; Mg/Phos now WNL after replacement; replacemencorrected Ca normal.  Renal: CKD 4 (baseline SCr ~1.5), SCr 1.92 on admit, increased 1.4 today. UOP 1512 ml/24hr on IV lasix daily.  LFTs 7/18: AST/ALT WNL, Tbili elevated at 1.6 on 7/13 but down to normal.  TGs: 101 (7/16), check in AM  Prealbumin: 10.2 (7/16), check in AM  NUTRITIONAL GOALS                                                                                              RD recs on 7/15:  65-80 g/day protein, 1400-1600 kcal/day Clinimix / at a goal rate of 24ml/hr + 20% fat emulsion at 71ml/hr to provide: 72g/day protein, 1502Kcal/day.  PLAN  Continue Clinimix E 5/15 to 69ml/hr - have now advanced to goal due to likely refeeding and advancement of diet  20% fat emulsion at 16ml/hr x 12 hrs.  TPN to contain standard multivitamins and trace elements.  TPN to contain famotidine 20mg  (renally adjusted for CrCl < 38ml/min)  Discontinue IVF per MD.  Continue SSI sensitive scale, change CBGs to TIDWM.   TPN lab panels on Mondays & Thursdays.  F/u daily for ability to stop TPN.   Peggyann Juba, PharmD, BCPS Pager: [phone] 02/25/2018 8:04 AM

## 2018-02-25 NOTE — Progress Notes (Signed)
PROGRESS NOTE    [name]  GYJ:[id number] DOB: 1928/01/22 DOA: 02/15/2018 PCP: Cassandria Anger, MD     Brief Narrative:  [name] is a 84 y.o.femalewith medical history significant ofendometrial cancer s/p hysterectomy and radiation more than 30 years ago, sigmoidal cancer s/p partial bowel resection, ileostomy and colostomy bag placement, nephrolithiasis s/p bilateral nephrostomy tube placement, severe malnutrition, and insomnia, presented with complaints of worsening abdominal pain and distension of one week duration.CT abd pelvis w no contrast done on admission revealed SBO and suspected intra abdominal abscess vs necrotic mass. Choledocholithiasis and cholelithiasis with normal LFTs.  Admitted for small bowel obstruction. General surgery consulted and following.  Hospitalization also complicated by sinus bradycardia and severe malnutrition.  Cardiology was consulted who recommended treating malnutrition to improve sinus bradycardia.  New events last 24 hours / Subjective: Doing well this morning. Had some low BP overnight and she was given IVF. Tolerated soft diet this morning with no nausea, vomiting, abdominal pain.   Assessment & Plan:   Principal Problem:   SBO (small bowel obstruction) (HCC) Active Problems:   CANCER, ENDOMETRIUM   FISTULA, ANAL   Abdominal pain, generalized   Insomnia   Colostomy in place (HCC)   Nausea   Abdominal distension   Abnormal CT of the abdomen   Hypokalemia   Hypoalbuminemia   Severe protein-calorie malnutrition (HCC)   Microcytic anemia   Acute renal failure superimposed on stage 3 chronic kidney disease (HCC)   Hypotension   Small bowel obstruction, suspect secondary to adhesions from multiple abdominal surgeries -General surgery following -NGT removed -Advanced to soft diet today   Intra-abdominal abscess versus necrotic mass/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 Drainage deferred -Completed 8 days of antibiotics 7/11-7/16 Zosyn, 7/16-7/18 Unasyn   Bilateral pleural effusion  -CXR independently reviewed 7/17, showed bilateral interstitial edema with small bilateral pleural effusions. Improved with IV lasix  -Stop lasix. Patient had low BP overnight and was given IVF. She remains off [place] O2 without respiratory distress   Asymptomatic sinus bradycardia  -Cardiology consulted, suspects 2/2 to severe malnutrition. Now signed off.  -Resolved. Normal sinus rate 70s this morning with PVCs.   Severe protein calorie malnutrition -TPN per pharmacy until oral intake stable and adequate   CKD 4 -Baseline Cr appears to be 1.5 -Stable  Hx of endometrial cancer s/p radiation  -Radiation done more than 30 years ago  Hx of colon cancer s/p resection and colostomy  Hx of nephrolithiasis s/p nephrostomy tube placement  Chronic anemia/iron deficiency anemia -FOBT negative -Status post 2 units PRBC transfusion -Hemoglobin 6.1 on presentation -Transfused 2 units PRBC on 02/16/2018 -Hg stable  Cholelithiasis/choledocholithiasis -No sign of obstructive jaundice or transaminitis -GI will follow outpatient   DVT prophylaxis: Subq hep  Code Status: Full Family Communication: No family at bedside Disposition Plan: Pending clinical improvement, PT OT eval rec SNF    Consultants:   General surgery  Cardiology  Radiation oncology  Gyn oncology  Palliative care team   IR  Procedures:   PICC 7/15   Antimicrobials:  Anti-infectives (From admission, onward)   Start     Dose/Rate Route Frequency Ordered Stop   02/20/18 1600  ampicillin-sulbactam (UNASYN) 1.5 g in sodium chloride 0.9 % 100 mL IVPB     1.5 g 200 mL/hr over 30 Minutes Intravenous Every 12 hours 02/20/18 1134 02/22/18 0531   02/16/18 0400  piperacillin-tazobactam (ZOSYN) IVPB 2.25 g  Status:  Discontinued     2.25 g 100 mL/hr over 30 Minutes Intravenous Every 6 hours 02/16/18 0009 02/20/18 1134   02/15/18 2215  piperacillin-tazobactam (ZOSYN) IVPB 3.375 g     3.375 g 100 mL/hr over 30 Minutes Intravenous  Once 02/15/18 2202 02/15/18 2333       Objective: Vitals:   02/25/18 0400 02/25/18 0443 02/25/18 0500 02/25/18 1000  BP: (!) 74/23 (!) 78/21 (!) 79/47 (!) 108/50  Pulse: 68 66 66   Resp: (!) 21 (!) 35 18 (!) 30  Temp:      TempSrc:      SpO2: 91% 93% 93%   Weight:      Height:        Intake/Output Summary (Last 24 hours) at 02/25/2018 1037 Last data filed at 02/25/2018 1000 Gross per 24 hour  Intake 1740.33 ml  Output 3020 ml  Net -1279.67 ml   Filed Weights   02/15/18 1740 02/16/18 0942 02/18/18 1130  Weight: 41.7 kg (92 lb) 40 kg (88 lb 2.9 oz) 41.7 kg (92 lb)   Examination: General exam: Appears calm and comfortable  Respiratory system: Clear to auscultation. Respiratory effort normal. Cardiovascular system: S1 & S2 heard, RRR. No JVD, murmurs, rubs, gallops or clicks. No pedal edema. Gastrointestinal system: Abdomen is nondistended, soft and nontender. No organomegaly or masses felt. Normal bowel sounds heard. Central nervous system: Alert and oriented. No focal neurological deficits. Extremities: Symmetric 5 x 5 power. Skin: No rashes, lesions or ulcers Psychiatry: Judgement and insight appear normal. Mood & affect appropriate.     Data Reviewed: I have personally reviewed following labs and imaging studies  CBC: Recent Labs  Lab 02/20/18 0316 02/21/18 0305 02/21/18 1352 02/22/18 0422 02/24/18 0420 02/25/18 0459  WBC 7.0 7.0 6.9 7.9 5.2 5.0  NEUTROABS 5.6  --   --   --   --   --   HGB 10.5* 9.4* 9.8* 10.1*  9.4* 9.2*  HCT 35.2* 30.7* 32.2* 32.1* 30.0* 29.7*  MCV 80.2 78.9 77.8* 77.5* 77.9* 78.6  PLT 202 135* 140* 159 146* 967*   Basic Metabolic Panel: Recent Labs  Lab 02/21/18 1352 02/22/18 0422 02/23/18 0240 02/24/18 0420 02/25/18 0459  NA 139 137 132* 135 134*  K 3.9 3.0* 3.9 3.5 4.0  CL 111 106 104 105 104  CO2 21* 23 24 25 25   GLUCOSE 159* 122* 104* 103* 106*  BUN 19 20 20  25* 30*  CREATININE 1.33* 1.33* 1.29* 1.25* 1.40*  CALCIUM 8.6* 8.4* 8.5* 8.4* 8.8*  MG 2.0 1.8 1.8 1.7 2.0  PHOS 4.1 2.9 2.4* 3.5 3.1   GFR: Estimated Creatinine Clearance: 18.3 mL/min (A) (by C-G formula based on SCr of 1.4 mg/dL (H)). Liver Function Tests: Recent Labs  Lab 02/20/18 0316 02/21/18 0305 02/21/18 1352 02/22/18 0422  AST 20 21 18 17   ALT 13 14 15 14   ALKPHOS 78 75 79 76  BILITOT 0.6 0.4 0.4 0.4  PROT 5.4* 4.8* 5.2* 5.0*  ALBUMIN 2.1* 1.8* 2.0* 1.9*   No results for input(s): LIPASE, AMYLASE in the last 168 hours. No results for input(s): AMMONIA in the last 168 hours. Coagulation Profile: No results for input(s): INR, PROTIME in the last 168 hours. Cardiac Enzymes: Recent  Labs  Lab 02/21/18 1352 02/21/18 2007  TROPONINI 0.10* 0.10*   BNP (last 3 results) No results for input(s): PROBNP in the last 8760 hours. HbA1C: No results for input(s): HGBA1C in the last 72 hours. CBG: Recent Labs  Lab 02/24/18 0633 02/24/18 1349 02/24/18 1528 02/24/18 2151 02/25/18 0623  GLUCAP 106* 143* 139* 99 99   Lipid Profile: No results for input(s): CHOL, HDL, LDLCALC, TRIG, CHOLHDL, LDLDIRECT in the last 72 hours. Thyroid Function Tests: No results for input(s): TSH, T4TOTAL, FREET4, T3FREE, THYROIDAB in the last 72 hours. Anemia Panel: No results for input(s): VITAMINB12, FOLATE, FERRITIN, TIBC, IRON, RETICCTPCT in the last 72 hours. Sepsis Labs: Recent Labs  Lab 02/21/18 1352  LATICACIDVEN 1.7    Recent Results (from the past 240 hour(s))  Blood culture (routine x 2)      Status: None   Collection Time: 02/15/18 10:20 PM  Result Value Ref Range Status   Specimen Description   Final    BLOOD BLOOD RIGHT FOREARM Performed at [facility] [address], [place], [place] [address]    Special Requests   Final    BOTTLES DRAWN AEROBIC AND ANAEROBIC Blood Culture adequate volume Performed at [facility] [address], [place], [place] [address]    Culture   Final    NO GROWTH 5 DAYS Performed at [facility], [place] [address] [place], [place] [address]    Report Status 02/21/2018 FINAL  Final  Blood culture (routine x 2)     Status: None   Collection Time: 02/15/18 10:25 PM  Result Value Ref Range Status   Specimen Description   Final    BLOOD RIGHT ANTECUBITAL Performed at [facility] [address] [place], [place] [address]    Special Requests   Final    BOTTLES DRAWN AEROBIC AND ANAEROBIC Blood Culture adequate volume Performed at [facility] [address], [place], [place] [address]    Culture   Final    NO GROWTH 5 DAYS Performed at [facility], [place] [address] [place], [place] [address]    Report Status 02/21/2018 FINAL  Final  MRSA PCR Screening     Status: None   Collection Time: 02/18/18 12:50 PM  Result Value Ref Range Status   MRSA by PCR NEGATIVE NEGATIVE Final    Comment:        The GeneXpert MRSA Assay (FDA approved for NASAL specimens only), is one component of a comprehensive MRSA colonization surveillance program. It is not intended to diagnose MRSA infection nor to guide or monitor treatment for MRSA infections. Performed at [facility], [place] [address] [place], [place] [address]   Body fluid culture     Status: None   Collection Time: 02/18/18  3:14 PM  Result Value Ref Range Status   Specimen Description   Final    HEMODIALYSIS FISTULA Performed at [facility], [place] [address], [place], [place] [address]    Special Requests   Final    Normal Performed at [facility], [place] [address], [place], [place] [address]    Gram Stain   Final    ABUNDANT WBC PRESENT, PREDOMINANTLY PMN ABUNDANT GRAM VARIABLE ROD Performed at [facility], [place] [address] [place], [place] [address]    Culture FEW ESCHERICHIA COLI FEW STREPTOCOCCUS GROUP F   Final   Report Status 02/22/2018 FINAL  Final  Organism ID, Bacteria ESCHERICHIA COLI  Final      Susceptibility   Escherichia coli - MIC*    AMPICILLIN 4 SENSITIVE Sensitive     CEFAZOLIN <=4 SENSITIVE Sensitive     CEFEPIME <=1 SENSITIVE Sensitive     CEFTAZIDIME <=1 SENSITIVE Sensitive     CEFTRIAXONE <=1 SENSITIVE Sensitive     CIPROFLOXACIN <=0.25 SENSITIVE Sensitive     GENTAMICIN <=1 SENSITIVE Sensitive     IMIPENEM <=0.25 SENSITIVE Sensitive     TRIMETH/SULFA <=20 SENSITIVE Sensitive     AMPICILLIN/SULBACTAM <=2 SENSITIVE Sensitive     PIP/TAZO <=4 SENSITIVE Sensitive     Extended ESBL NEGATIVE Sensitive     * FEW ESCHERICHIA COLI       Radiology Studies: No results found.    Scheduled Meds: . chlorhexidine  15 mL Mouth Rinse BID  . Chlorhexidine Gluconate Cloth  6 each Topical Daily  . feeding supplement (ENSURE ENLIVE)  237 mL Oral BID BM  . heparin injection (subcutaneous)  5,000 Units Subcutaneous Q8H  . insulin aspart  0-9 Units Subcutaneous TID WC  . mouth rinse  15 mL Mouth Rinse BID  . sodium chloride flush  10-40 mL Intracatheter Q12H  . sodium chloride flush  3 mL Intravenous Q12H   Continuous Infusions: . Marland KitchenTPN (CLINIMIX-E) Adult 50 mL/hr at 02/25/18 0500  . Marland KitchenTPN (CLINIMIX-E) Adult     And  . Fat emulsion    . sodium chloride 10 mL/hr at 02/25/18 0624     LOS: 9 days    Time spent: 20 minutes   Dessa Phi, DO [facility] [email or web address] Password [facility] 02/25/2018, 10:37 AM

## 2018-02-26 LAB — TRIGLYCERIDES: Triglycerides: 34 mg/dL (ref <150)

## 2018-02-26 LAB — COMPREHENSIVE METABOLIC PANEL
ALT: 20 U/L (ref 0–44)
ANION GAP: 6 (ref 5–15)
AST: 21 U/L (ref 15–41)
Albumin: 1.8 g/dL — ABNORMAL LOW (ref 3.5–5.0)
Alkaline Phosphatase: 162 U/L — ABNORMAL HIGH (ref 38–126)
BILIRUBIN TOTAL: 0.3 mg/dL (ref 0.3–1.2)
BUN: 34 mg/dL — ABNORMAL HIGH (ref 8–23)
CO2: 23 mmol/L (ref 22–32)
Calcium: 9 mg/dL (ref 8.9–10.3)
Chloride: 106 mmol/L (ref 98–111)
Creatinine, Ser: 1.34 mg/dL — ABNORMAL HIGH (ref 0.44–1.00)
GFR calc non Af Amer: 34 mL/min — ABNORMAL LOW (ref >60)
GFR, EST AFRICAN AMERICAN: 40 mL/min — AB (ref >60)
Glucose, Bld: 102 mg/dL — ABNORMAL HIGH (ref 70–99)
Potassium: 4.1 mmol/L (ref 3.5–5.1)
SODIUM: 135 mmol/L (ref 135–145)
TOTAL PROTEIN: 5.2 g/dL — AB (ref 6.5–8.1)

## 2018-02-26 LAB — DIFFERENTIAL
BASOS ABS: 0 10*3/uL (ref 0.0–0.1)
Basophils Relative: 0 %
EOS ABS: 0.1 10*3/uL (ref 0.0–0.7)
Eosinophils Relative: 2 %
Lymphocytes Relative: 24 %
Lymphs Abs: 1.1 10*3/uL (ref 0.7–4.0)
Monocytes Absolute: 0.4 10*3/uL (ref 0.1–1.0)
Monocytes Relative: 8 %
Neutro Abs: 2.9 10*3/uL (ref 1.7–7.7)
Neutrophils Relative %: 66 %

## 2018-02-26 LAB — CBC
HCT: 27.5 % — ABNORMAL LOW (ref 36.0–46.0)
Hemoglobin: 8.5 g/dL — ABNORMAL LOW (ref 12.0–15.0)
MCH: 24.4 pg — ABNORMAL LOW (ref 26.0–34.0)
MCHC: 30.9 g/dL (ref 30.0–36.0)
MCV: 79 fL (ref 78.0–100.0)
PLATELETS: 166 10*3/uL (ref 150–400)
RBC: 3.48 MIL/uL — ABNORMAL LOW (ref 3.87–5.11)
RDW: 23.3 % — AB (ref 11.5–15.5)
WBC: 4.4 10*3/uL (ref 4.0–10.5)

## 2018-02-26 LAB — PHOSPHORUS: PHOSPHORUS: 3.2 mg/dL (ref 2.5–4.6)

## 2018-02-26 LAB — PREALBUMIN: PREALBUMIN: 8.8 mg/dL — AB (ref 18–38)

## 2018-02-26 LAB — GLUCOSE, CAPILLARY: GLUCOSE-CAPILLARY: 99 mg/dL (ref 70–99)

## 2018-02-26 LAB — MAGNESIUM: MAGNESIUM: 2 mg/dL (ref 1.7–2.4)

## 2018-02-26 MED ORDER — FAMOTIDINE 20 MG PO TABS
20.0000 mg | ORAL_TABLET | Freq: Every day | ORAL | Status: DC
  Administered 2018-02-26 – 2018-02-28 (×3): 20 mg via ORAL
  Filled 2018-02-26 (×3): qty 1

## 2018-02-26 MED ORDER — OXYCODONE-ACETAMINOPHEN 5-325 MG PO TABS
1.0000 | ORAL_TABLET | ORAL | Status: DC | PRN
  Administered 2018-02-26 – 2018-02-28 (×7): 2 via ORAL
  Administered 2018-02-28: 1 via ORAL
  Administered 2018-03-01 (×2): 2 via ORAL
  Filled 2018-02-26 (×11): qty 2

## 2018-02-26 MED ORDER — ADULT MULTIVITAMIN LIQUID CH
15.0000 mL | Freq: Every day | ORAL | Status: DC
  Administered 2018-02-27 – 2018-03-01 (×3): 15 mL via ORAL
  Filled 2018-02-26 (×3): qty 15

## 2018-02-26 MED ORDER — ADULT MULTIVITAMIN W/MINERALS CH
1.0000 | ORAL_TABLET | Freq: Every day | ORAL | Status: DC
  Filled 2018-02-26: qty 1

## 2018-02-26 MED ORDER — MIRTAZAPINE 15 MG PO TABS
15.0000 mg | ORAL_TABLET | Freq: Every day | ORAL | Status: DC
  Administered 2018-02-26 – 2018-02-28 (×3): 15 mg via ORAL
  Filled 2018-02-26 (×3): qty 1

## 2018-02-26 MED ORDER — SODIUM CHLORIDE 0.9 % IV BOLUS
1000.0000 mL | Freq: Once | INTRAVENOUS | Status: AC
  Administered 2018-02-26: 1000 mL via INTRAVENOUS

## 2018-02-26 MED ORDER — ENSURE ENLIVE PO LIQD
237.0000 mL | Freq: Three times a day (TID) | ORAL | Status: DC
  Administered 2018-02-26 – 2018-03-01 (×8): 237 mL via ORAL

## 2018-02-26 MED ORDER — OXYCODONE-ACETAMINOPHEN 5-325 MG PO TABS
1.0000 | ORAL_TABLET | Freq: Four times a day (QID) | ORAL | Status: DC | PRN
  Administered 2018-02-26: 1 via ORAL
  Filled 2018-02-26: qty 1

## 2018-02-26 MED ORDER — TEMAZEPAM 15 MG PO CAPS: 15.0000 mg | ORAL_CAPSULE | Freq: Every evening | ORAL | Status: DC | PRN

## 2018-02-26 NOTE — Clinical Social Work Note (Addendum)
Clinical Social Work Assessment  Patient Details  Name: [name] MRN: [id number] Date of Birth: [birth year]  Date of referral:  02/26/18               Reason for consult:  Facility Placement                Permission sought to share information with:  Family Supports, Case Manager Permission granted to share information::  Yes, Verbal Permission Granted  Name::      Jadine Brumley.   Agency::  SNF  Relationship::   Son   Contact Information:   [phone]  Housing/Transportation Living arrangements for the past 2 months:  [place] of Information:  Patient Patient Interpreter Needed:  None Criminal Activity/Legal Involvement Pertinent to Current Situation/Hospitalization:  No - Comment as needed Significant Relationships:  Adult Children Lives with:  Self Do you feel safe going back to the place where you live?  Yes Need for family participation in patient care:  Yes (Comment)  Care giving concerns:   Patient admitted for abdominal pain and distention.  About a week ago, pt has noticed the decrease of colostomy output and gradual abdominal distension. She could initially still keep food down. However the symptoms got worse over time, she only had very little output this morning from colostomy bag, has been having severe abdominal cramps, felt her abdomen is very distended, no flatulus, and is nauseated with anorexia and no appetite at home.   Physical therapy completed evaluation-SNF placement for short rehab.   Social Worker assessment / plan:  CSW met with the patient at beside explain role and reason for visit- to assist with discharge planning to SNF for rehab. Patient reports she live alone and is independent with all activities of daily living. She reports her son and minor granddaughter live within a feet from her home. She reports they are very supportive and help her if she needs it. She reports at discharge she prefers to go home with home  health. She reports she is familiar with their services.  She reports she is open to SNF as a back up plan if she does not progress as quickly. She reports she has not walked much, so she is unsure if she requires assistance. CSW explain the SNF process and later following up with bed offers, if she chooses to go to SNF. CSW inform the patient she will need prior insurance authorization as well.   CSW will complete FL2, and PASRR closer to patient expected discharge time.   Plan: Home w/ Home Health vs. SNF rehab.   Employment status:  Retired Nurse, adult PT Recommendations:  [place] / Referral to community resources:  [place]  Patient/Family's Response to care:  Agreeable and Responding well to care.   Patient/Family's Understanding of and Emotional Response to Diagnosis, Current Treatment, and Prognosis:  No family at bedside. Patient alert and oriented x4 and has a good understanding of her diagnosis and current treatment.   Emotional Assessment Appearance:  Appears stated age Attitude/Demeanor/Rapport:    Affect (typically observed):  Accepting, Adaptable, Pleasant Orientation:  Oriented to Self, Oriented to Place, Oriented to  Time, Oriented to Situation Alcohol / Substance use:  Not Applicable Psych involvement (Current and /or in the community):  No (Comment)  Discharge Needs  Concerns to be addressed:  Discharge Planning Concerns Readmission within the last 30 days:  No Current discharge risk:  Dependent with  Mobility Barriers to Discharge:  Continued Medical Work up, [place], LCSW 02/26/2018, 10:08 AM

## 2018-02-26 NOTE — Progress Notes (Signed)
Pt. Bolus running now for low BP. Will continue to monitor pt. Closely.

## 2018-02-26 NOTE — Progress Notes (Addendum)
[place] NOTE   Pharmacy Consult for TPN Indication: SBO  Patient Measurements: Height: _0  (162.6 cm) Weight: 92 lb (41.7 kg) IBW/kg (Calculated) : 54.7 TPN AdjBW (KG): 41.7 Body mass index is 15.79 kg/m. Usual Weight: 45kg  Insulin Requirements: 3 unit novolog in past 24hr  Current Nutrition: advanced to soft diet 7/20, Ensure BID added 7/21  IVF: NS at [facility] access: Double lumen PICC  TPN start date: 7/16  ASSESSMENT                                                                                                          HPI: 84 yo female admitted 7/11 with worsening abdominal pain and distension since last week.CT abd pelvis w no contrast done on admission revealed SBO and suspected intra abdominal abscess vs necrotic mass. PMH includes endometrial cancer s/p hysterectomy, sigmoidal cancer s/p partial bowel resection, ileostomy and colostomy bag placement, nephrolithiasis s/p bilateral nephrostomy tube placement, hyponatremia, severe malnutrition, and insomnia.  CCS recommends to continue NPO, NGT to suction and begin TPN.  PICC ordered.  Significant events:  7/16: Start TPN. NGT removed. 7/18: Advance to clear liquids. Abdominal CT shows pelvic abscess, no intervention per IR. 7/20: Advance to full liquids. Per surgery, continue TPN until reliably tolerating regular diet for 24hrs. 7/21: Advance to soft diet and add Ensure BID.  Today:    Glucose: No hx DM, CBGs at goal (< 150)  Electrolytes: WNL.  Renal: CKD 4 (baseline SCr ~1.5), SCr 1.92 on admit, 1.34 today. UOP 1350 ml/24hr . Stool 1375 ml/24 hr  LFTs 7/18: AST/ALT WNL, alk phos sl elevated at 162, t bili WNL  TGs: 101 (7/16), 34 7/22  Prealbumin: 10.2 (7/16), in process 7/22  NUTRITIONAL GOALS                                                                                             RD recs on 7/15:  65-80 g/day protein, 1400-1600 kcal/day Clinimix / at a  goal rate of 20m/hr + 20% fat emulsion at 216mhr to provide: 72g/day protein, 1502Kcal/day.   PLAN                DC TPN after today's bag - d/w CCS PA DC SSI and CBGs.  Start PO pepcid and PO MVI TPN labs dc'd  Consider ordering home meds remeron, lasix, vit D, Vit b12 Consider adding PO pain med in place of IV dilaudid  MiEudelia BunchPharm.D 02/26/2018 7:19 AM

## 2018-02-26 NOTE — Progress Notes (Signed)
[facility] Progress Note     Subjective: CC-  Patient states that she feels ok. Started on soft diet yesterday and is tolerating well. States that she does not have much of an appetite and food does not taste good, but she denies any n/v. Continues to have good output from ostomy. She did drink Ensure x2 yesterday, but only ate about 25% of her meals.  Objective: Vital signs in last 24 hours: Temp:  [98.2 F (36.8 C)-100.3 F (37.9 C)] 99 F (37.2 C) (07/22 0800) Pulse Rate:  [84] 84 (07/22 0800) Resp:  [14-30] 17 (07/22 0800) BP: (94-114)/(30-71) 107/50 (07/22 0800) SpO2:  [99 %] 99 % (07/22 0800) Last BM Date: 02/25/18(colostomy)  Intake/Output from previous day: 07/21 0701 - 07/22 0700 In: 1370 [I.V.:1370] Out: 2725 [Urine:1350; Stool:1375] Intake/Output this shift: Total I/O In: 420 [I.V.:420] Out: -   PE: Gen:  Alert, NAD, pleasant HEENT: EOM's intact, pupils equal and round Card:  RRR Pulm:  CTAB, no W/R/R, effort normal Abd: Soft, NT/ND, +BS, soft stool in ostomy pouch Ext:  muslce wasting noted to all 4 extremities. No calf edema Psych: A&Ox3  Skin: no rashes noted, warm and dry  Lab Results:  Recent Labs    02/25/18 0459 02/26/18 0457  WBC 5.0 4.4  HGB 9.2* 8.5*  HCT 29.7* 27.5*  PLT 146* 166   BMET Recent Labs    02/25/18 0459 02/26/18 0457  NA 134* 135  K 4.0 4.1  CL 104 106  CO2 25 23  GLUCOSE 106* 102*  BUN 30* 34*  CREATININE 1.40* 1.34*  CALCIUM 8.8* 9.0   PT/INR No results for input(s): LABPROT, INR in the last 72 hours. CMP     Component Value Date/Time   NA 135 02/26/2018 0457   K 4.1 02/26/2018 0457   CL 106 02/26/2018 0457   CO2 23 02/26/2018 0457   GLUCOSE 102 (H) 02/26/2018 0457   BUN 34 (H) 02/26/2018 0457   CREATININE 1.34 (H) 02/26/2018 0457   CALCIUM 9.0 02/26/2018 0457   CALCIUM 9.8 11/21/2006 2218   PROT 5.2 (L) 02/26/2018 0457   ALBUMIN 1.8 (L) 02/26/2018 0457   AST 21 02/26/2018 0457   ALT 20  02/26/2018 0457   ALKPHOS 162 (H) 02/26/2018 0457   BILITOT 0.3 02/26/2018 0457   GFRNONAA 34 (L) 02/26/2018 0457   GFRAA 40 (L) 02/26/2018 0457   Lipase     Component Value Date/Time   LIPASE 20 02/15/2018 1819       Studies/Results: No results found.  Anti-infectives: Anti-infectives (From admission, onward)   Start     Dose/Rate Route Frequency Ordered Stop   02/20/18 1600  ampicillin-sulbactam (UNASYN) 1.5 g in sodium chloride 0.9 % 100 mL IVPB     1.5 g 200 mL/hr over 30 Minutes Intravenous Every 12 hours 02/20/18 1134 02/22/18 0531   02/16/18 0400  piperacillin-tazobactam (ZOSYN) IVPB 2.25 g  Status:  Discontinued     2.25 g 100 mL/hr over 30 Minutes Intravenous Every 6 hours 02/16/18 0009 02/20/18 1134   02/15/18 2215  piperacillin-tazobactam (ZOSYN) IVPB 3.375 g     3.375 g 100 mL/hr over 30 Minutes Intravenous  Once 02/15/18 2202 02/15/18 2333       Assessment/Plan Hx of colon cancer Hx of endometrial cancer Radiation fibrosis Anemia of chronic disease - Hg 8.5 from 9.2, monitor Severe protein calorie malnutrition  Ureteral stenosis and hx of nephrolithiasis - b/l nephrostomy tubes present Depression  Choledocholithiasis - outpatient  GI follow up, no signs of obstruction or cholangitis  SBO- multiple past abdominal surgeries, likely due to adhesive disease - CT 7/11: sbo with transition within the pelvis, likely adhesions with ?mass in pelvis - on gyn/onc exam - found large cavity in the pelvis with open vaginal cuff - presumably from prior radiation? - CT 7/18: Mildly dilated loops of small bowel with air-fluid levels suspicious for mild small bowel obstruction; complex pelvic appearance was that the irregular rim enhancing structure posterior to the severely inflamed urinary bladder likely reflects a distended and markedly inflamed vaginal vault rather than a separate abscess or mass - tolerating soft diet and having good ostomy output  FEN: soft diet,  Ensure TID VTE: SQ heparin  ID: Zosyn 7/11>7/16; Unasyn 7/16>7/18  Plan: SBO seems to be resolving. D/c TPN today, increase Ensure to TID and continue soft diet. Continue therapies and ambulation/OOB. Ok to transfer out of the unit from surgical standpoint. Patient has been off abx for 4 days and WBC WNL, TMAX 100.3. Patient has possible fistula to her vaginal cuff but is not a great surgical candidate for repair; will discuss need for suppressant antibiotics if this is not going to be surgically fixed with MD.   LOS: 10 days    Wellington Hampshire , [facility] 02/26/2018, 8:17 AM Pager: [phone] Consults: [phone] Mon 7:00 am -11:30 AM Tues-Fri 7:00 am-4:30 pm Sat-Sun 7:00 am-11:30 am

## 2018-02-26 NOTE — Progress Notes (Signed)
PROGRESS NOTE    [name]  GUY:[id number] DOB: 1927/09/21 DOA: 02/15/2018 PCP: Cassandria Anger, MD     Brief Narrative:  [name] is a 84 y.o.femalewith medical history significant ofendometrial cancer s/p hysterectomy and radiation more than 30 years ago, sigmoidal cancer s/p partial bowel resection, ileostomy and colostomy bag placement, nephrolithiasis s/p bilateral nephrostomy tube placement, severe malnutrition, and insomnia, presented with complaints of worsening abdominal pain and distension of one week duration.CT abd pelvis w no contrast done on admission revealed SBO and suspected intra abdominal abscess vs necrotic mass. Choledocholithiasis and cholelithiasis with normal LFTs.  Admitted for small bowel obstruction. General surgery consulted and following.  Hospitalization also complicated by sinus bradycardia and severe malnutrition.  Cardiology was consulted who recommended treating malnutrition to improve sinus bradycardia.  New events last 24 hours / Subjective: Tolerating soft diet now. No nausea, vomiting, abdominal pain. After my initial examination, RN informed me of patient's low BP. IVF bolus given, transfer order for telemetry canceled.   Assessment & Plan:   Principal Problem:   SBO (small bowel obstruction) (HCC) Active Problems:   CANCER, ENDOMETRIUM   FISTULA, ANAL   Abdominal pain, generalized   Insomnia   Colostomy in place (HCC)   Nausea   Abdominal distension   Abnormal CT of the abdomen   Hypokalemia   Hypoalbuminemia   Severe protein-calorie malnutrition (HCC)   Microcytic anemia   Acute renal failure superimposed on stage 3 chronic kidney disease (HCC)   Hypotension   Small bowel obstruction, suspect secondary to adhesions from multiple abdominal surgeries -General surgery following -NGT removed -Now tolerating soft diet, TPN to be discontinued this evening  Intra-abdominal abscess versus necrotic mass/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 Drainage deferred -Completed 8 days of antibiotics 7/11-7/16 Zosyn, 7/16-7/18 Unasyn   Bilateral pleural effusion  -CXR independently reviewed 7/17, showed bilateral interstitial edema with small bilateral pleural effusions. Improved with IV lasix  -Stop lasix. Patient had low BP overnight and was given IVF. She remains off [place] O2 without respiratory distress   Asymptomatic sinus bradycardia  -Cardiology consulted, suspects 2/2 to severe malnutrition. Now signed off.  -Resolved. Normal sinus rate 70s this morning with PVCs.   Severe protein calorie malnutrition -TPN per pharmacy until oral intake stable and adequate - stop this evening   CKD 4 -Baseline Cr appears to be 1.5 -Stable  Hx of endometrial cancer s/p radiation  -Radiation done more than 30 years ago  Hx of colon cancer s/p resection and colostomy  Hx of nephrolithiasis s/p nephrostomy tube placement  Chronic anemia/iron deficiency anemia -FOBT negative -Status post 2 units PRBC transfusion -Hemoglobin 6.1 on presentation -Transfused 2 units PRBC on 02/16/2018 -Hg stable  Cholelithiasis/choledocholithiasis -No sign of obstructive jaundice or transaminitis -GI will follow outpatient   DVT prophylaxis: Subq hep  Code Status: Full Family Communication: No family at bedside Disposition Plan: Pending clinical improvement, PT OT eval rec SNF. Keep in stepdown due to low BP this morning.    Consultants:   General surgery  Cardiology  Radiation oncology  Gyn oncology  Palliative care team   IR  Procedures:    PICC 7/15   Antimicrobials:  Anti-infectives (From admission, onward)   Start     Dose/Rate Route Frequency Ordered Stop   02/20/18 1600  ampicillin-sulbactam (UNASYN) 1.5 g in sodium chloride 0.9 % 100 mL IVPB     1.5 g 200 mL/hr over 30 Minutes Intravenous Every 12 hours 02/20/18 1134 02/22/18 0531   02/16/18 0400  piperacillin-tazobactam (ZOSYN) IVPB 2.25 g  Status:  Discontinued     2.25 g 100 mL/hr over 30 Minutes Intravenous Every 6 hours 02/16/18 0009 02/20/18 1134   02/15/18 2215  piperacillin-tazobactam (ZOSYN) IVPB 3.375 g     3.375 g 100 mL/hr over 30 Minutes Intravenous  Once 02/15/18 2202 02/15/18 2333       Objective: Vitals:   02/26/18 1115 02/26/18 1120 02/26/18 1130 02/26/18 1145  BP:  (!) 99/37 (!) 87/39 (!) 106/55  Pulse:  (!) 56  61  Resp: (!) 23 (!) 28 (!) 33 19  Temp:      TempSrc:      SpO2:  100%  97%  Weight:      Height:        Intake/Output Summary (Last 24 hours) at 02/26/2018 1201 Last data filed at 02/26/2018 0900 Gross per 24 hour  Intake 1564 ml  Output 2225 ml  Net -661 ml   Filed Weights   02/15/18 1740 02/16/18 0942 02/18/18 1130  Weight: 41.7 kg (92 lb) 40 kg (88 lb 2.9 oz) 41.7 kg (92 lb)   Examination: General exam: Appears calm and comfortable  Respiratory system: Clear to auscultation. Respiratory effort normal. Cardiovascular system: S1 & S2 heard, RRR. No JVD, murmurs, rubs, gallops or clicks. No pedal edema. Gastrointestinal system: Abdomen is nondistended, soft and nontender. No organomegaly or masses felt. Normal bowel sounds heard. +colostomy in place  Central nervous system: Alert and oriented. No focal neurological deficits. Extremities: Symmetric 5 x 5 power. Skin: No rashes, lesions or ulcers Psychiatry: Judgement and insight appear normal. Mood & affect appropriate.     Data Reviewed: I have personally reviewed following labs and imaging studies  CBC: Recent Labs  Lab 02/20/18 0316  02/21/18 1352  02/22/18 0422 02/24/18 0420 02/25/18 0459 02/26/18 0457  WBC 7.0   < > 6.9 7.9 5.2 5.0 4.4  NEUTROABS 5.6  --   --   --   --   --  2.9  HGB 10.5*   < > 9.8* 10.1* 9.4* 9.2* 8.5*  HCT 35.2*   < > 32.2* 32.1* 30.0* 29.7* 27.5*  MCV 80.2   < > 77.8* 77.5* 77.9* 78.6 79.0  PLT 202   < > 140* 159 146* 146* 166   < > = values in this interval not displayed.   Basic Metabolic Panel: Recent Labs  Lab 02/22/18 0422 02/23/18 0240 02/24/18 0420 02/25/18 0459 02/26/18 0457  NA 137 132* 135 134* 135  K 3.0* 3.9 3.5 4.0 4.1  CL 106 104 105 104 106  CO2 23 24 25 25 23   GLUCOSE 122* 104* 103* 106* 102*  BUN 20 20 25* 30* 34*  CREATININE 1.33* 1.29* 1.25* 1.40* 1.34*  CALCIUM 8.4* 8.5* 8.4* 8.8* 9.0  MG 1.8 1.8 1.7 2.0 2.0  PHOS 2.9 2.4* 3.5 3.1 3.2   GFR: Estimated Creatinine Clearance: 19.1 mL/min (A) (by C-G formula based on SCr of 1.34 mg/dL (H)). Liver Function Tests: Recent Labs  Lab 02/20/18 0316 02/21/18 0305 02/21/18 1352 02/22/18 0422 02/26/18 0457  AST 20 21 18 17 21   ALT 13 14 15 14  20  ALKPHOS 78 75 79 76 162*  BILITOT 0.6 0.4 0.4 0.4 0.3  PROT 5.4* 4.8* 5.2* 5.0* 5.2*  ALBUMIN 2.1* 1.8* 2.0* 1.9* 1.8*   No results for input(s): LIPASE, AMYLASE in the last 168 hours. No results for input(s): AMMONIA in the last 168 hours. Coagulation Profile: No results for input(s): INR, PROTIME in the last 168 hours. Cardiac Enzymes: Recent Labs  Lab 02/21/18 1352 02/21/18 2007  TROPONINI 0.10* 0.10*   BNP (last 3 results) No results for input(s): PROBNP in the last 8760 hours. HbA1C: No results for input(s): HGBA1C in the last 72 hours. CBG: Recent Labs  Lab 02/24/18 2151 02/25/18 0623 02/25/18 1218 02/25/18 1748 02/25/18 2128  GLUCAP 99 99 155* 126* 132*   Lipid Profile: Recent Labs    02/26/18 0457  TRIG 34   Thyroid Function Tests: No results for input(s): TSH, T4TOTAL, FREET4, T3FREE, THYROIDAB in the last 72 hours. Anemia Panel: No results for  input(s): VITAMINB12, FOLATE, FERRITIN, TIBC, IRON, RETICCTPCT in the last 72 hours. Sepsis Labs: Recent Labs  Lab 02/21/18 1352  LATICACIDVEN 1.7    Recent Results (from the past 240 hour(s))  MRSA PCR Screening     Status: None   Collection Time: 02/18/18 12:50 PM  Result Value Ref Range Status   MRSA by PCR NEGATIVE NEGATIVE Final    Comment:        The GeneXpert MRSA Assay (FDA approved for NASAL specimens only), is one component of a comprehensive MRSA colonization surveillance program. It is not intended to diagnose MRSA infection nor to guide or monitor treatment for MRSA infections. Performed at [facility], [place] [address], [place], [place] [address]   Body fluid culture     Status: None   Collection Time: 02/18/18  3:14 PM  Result Value Ref Range Status   Specimen Description   Final    HEMODIALYSIS FISTULA Performed at [facility], [place] [address], [place], [place] [address]    Special Requests   Final    Normal Performed at [facility], [place] [address] [place], [place] [address]    Gram Stain   Final    ABUNDANT WBC PRESENT, PREDOMINANTLY PMN ABUNDANT GRAM VARIABLE ROD Performed at [facility], [place] [address], [place], [place] [address]    Culture FEW ESCHERICHIA COLI FEW STREPTOCOCCUS GROUP F   Final   Report Status 02/22/2018 FINAL  Final   Organism ID, Bacteria ESCHERICHIA COLI  Final      Susceptibility   Escherichia coli - MIC*    AMPICILLIN 4 SENSITIVE Sensitive     CEFAZOLIN <=4 SENSITIVE Sensitive     CEFEPIME <=1 SENSITIVE Sensitive     CEFTAZIDIME <=1 SENSITIVE Sensitive     CEFTRIAXONE <=1 SENSITIVE Sensitive     CIPROFLOXACIN <=0.25 SENSITIVE Sensitive     GENTAMICIN <=1 SENSITIVE Sensitive     IMIPENEM <=0.25 SENSITIVE Sensitive     TRIMETH/SULFA <=20 SENSITIVE Sensitive     AMPICILLIN/SULBACTAM <=2 SENSITIVE Sensitive     PIP/TAZO <=4 SENSITIVE Sensitive     Extended  ESBL NEGATIVE Sensitive     * FEW ESCHERICHIA COLI       Radiology Studies: No results found.    Scheduled Meds: . chlorhexidine  15 mL Mouth Rinse BID  . Chlorhexidine Gluconate Cloth  6 each Topical Daily  . famotidine  20 mg Oral QHS  . feeding supplement (ENSURE ENLIVE)  237 mL Oral TID BM  .  heparin injection (subcutaneous)  5,000 Units Subcutaneous Q8H  . mouth rinse  15 mL Mouth Rinse BID  . mirtazapine  15 mg Oral QHS  . [START ON 02/27/2018] multivitamin  15 mL Oral Daily  . sodium chloride flush  10-40 mL Intracatheter Q12H  . sodium chloride flush  3 mL Intravenous Q12H   Continuous Infusions: . Marland KitchenTPN (CLINIMIX-E) Adult 50 mL/hr at 02/26/18 0900  . sodium chloride 10 mL/hr at 02/26/18 0900  . sodium chloride 1,000 mL (02/26/18 1117)     LOS: 10 days    Time spent: 25 minutes   Dessa Phi, DO [facility] [email or web address] Password [facility] 02/26/2018, 12:01 PM

## 2018-02-26 NOTE — Progress Notes (Signed)
Per MD verbal order wait until this afternoon to transfer pt. Pt. BP soft, likely due to pain medication given this AM. Will continue to monitor pt. MD made aware of soft BP.

## 2018-02-26 NOTE — Progress Notes (Signed)
Physical Therapy Treatment Patient Details Name: [name] MRN: [id number] DOB: 02/05/1928 Today's Date: 02/26/2018    History of Present Illness Patient is an 84 y/o female admitted for SBO.  PMH:  endometrial and colon CA, and low back pain    PT Comments    The patient expresses desire to ambulate. Prior to  OOB, assisted to  change briefs and pad. After transferring to recliner, the patient  Was too fatigued to attempt ambulation and complained of abdominal pain. Continue PT and attempt ambulation next visit.  Follow Up Recommendations  SNF;Supervision/Assistance - 24 hour     Equipment Recommendations  None recommended by PT    Recommendations for Other Services       Precautions / Restrictions Precautions Precautions: Fall Precaution Comments: bilat nephrostomy tubes that  she attaches to  thigh with strap,colostomy Restrictions Weight Bearing Restrictions: No    Mobility  Bed Mobility Overal bed mobility: Needs Assistance Bed Mobility: Rolling Rolling: Min assist Sidelying to sit: Min assist       General bed mobility comments: assist for trunk and legs  Transfers Overall transfer level: Needs assistance Equipment used: Rolling walker (2 wheeled) Transfers: Sit to/from [facility] Sit to Stand: Mod assist         General transfer comment: lifting help from EOB, able to take steps to recliner.   Ambulation/Gait                 Stairs             Wheelchair Mobility    Modified Rankin (Stroke Patients Only)       Balance   Sitting-balance support: Bilateral upper extremity supported;Feet supported Sitting balance-Leahy Scale: Good     Standing balance support: Bilateral upper extremity supported Standing balance-Leahy Scale: Fair Standing balance comment: needs UE suppport                            Cognition Arousal/Alertness: Awake/alert Behavior During Therapy: WFL for tasks  assessed/performed                                   General Comments: at times states" I just so tired", gets frustrated with lines      Exercises      General Comments        Pertinent Vitals/Pain Pain Assessment: 0-10 Pain Score: 7  Pain Location: abdominal Pain Descriptors / Indicators: Pressure Pain Intervention(s): Patient requesting pain meds-RN notified;Monitored during session;Limited activity within patient's tolerance    Home Living                      Prior Function            PT Goals (current goals can now be found in the care plan section) Progress towards PT goals: Progressing toward goals    Frequency    Min 3X/week      PT Plan Current plan remains appropriate    Co-evaluation              AM-PAC PT "6 Clicks" Daily Activity  Outcome Measure  Difficulty turning over in bed (including adjusting bedclothes, sheets and blankets)?: A Lot Difficulty moving from lying on back to sitting on the side of the bed? : A Lot Difficulty sitting down on and standing up from a chair with arms (  e.g., wheelchair, bedside commode, etc,.)?: A Lot Help needed moving to and from a bed to chair (including a wheelchair)?: A Lot Help needed walking in hospital room?: Total Help needed climbing 3-5 steps with a railing? : Total 6 Click Score: 10    End of Session   Activity Tolerance: Patient limited by pain;Patient limited by fatigue Patient left: in chair;with call bell/phone within reach;with nursing/sitter in room Nurse Communication: Mobility status PT Visit Diagnosis: Muscle weakness (generalized) (M62.81);Other abnormalities of gait and mobility (R26.89)     Time: 7871-8367 PT Time Calculation (min) (ACUTE ONLY): 31 min  Charges:  $Therapeutic Activity: 23-37 mins                    G CodesTresa Endo PT [phone]    Claretha Cooper 02/26/2018, 10:27 AM

## 2018-02-27 LAB — GLUCOSE, CAPILLARY
GLUCOSE-CAPILLARY: 101 mg/dL — AB (ref 70–99)
GLUCOSE-CAPILLARY: 63 mg/dL — AB (ref 70–99)
GLUCOSE-CAPILLARY: 114 mg/dL — AB (ref 70–99)
GLUCOSE-CAPILLARY: 141 mg/dL — AB (ref 70–99)

## 2018-02-27 LAB — CBC
HEMATOCRIT: 27.9 % — AB (ref 36.0–46.0)
HEMOGLOBIN: 8.7 g/dL — AB (ref 12.0–15.0)
MCH: 24.9 pg — ABNORMAL LOW (ref 26.0–34.0)
MCHC: 31.2 g/dL (ref 30.0–36.0)
MCV: 79.9 fL (ref 78.0–100.0)
Platelets: 159 10*3/uL (ref 150–400)
RBC: 3.49 MIL/uL — ABNORMAL LOW (ref 3.87–5.11)
RDW: 23.7 % — ABNORMAL HIGH (ref 11.5–15.5)
WBC: 3.5 10*3/uL — ABNORMAL LOW (ref 4.0–10.5)

## 2018-02-27 LAB — BASIC METABOLIC PANEL
ANION GAP: 5 (ref 5–15)
BUN: 33 mg/dL — ABNORMAL HIGH (ref 8–23)
CHLORIDE: 114 mmol/L — AB (ref 98–111)
CO2: 24 mmol/L (ref 22–32)
Calcium: 9.4 mg/dL (ref 8.9–10.3)
Creatinine, Ser: 1.39 mg/dL — ABNORMAL HIGH (ref 0.44–1.00)
GFR calc Af Amer: 38 mL/min — ABNORMAL LOW (ref >60)
GFR, EST NON AFRICAN AMERICAN: 33 mL/min — AB (ref >60)
Glucose, Bld: 83 mg/dL (ref 70–99)
POTASSIUM: 4.7 mmol/L (ref 3.5–5.1)
SODIUM: 143 mmol/L (ref 135–145)

## 2018-02-27 LAB — MAGNESIUM: MAGNESIUM: 1.9 mg/dL (ref 1.7–2.4)

## 2018-02-27 NOTE — Progress Notes (Signed)
Occupational Therapy Treatment Patient Details Name: [name] MRN: [id number] DOB: April 26, 1928 Today's Date: 02/27/2018    History of present illness Patient is an 84 y/o female admitted for SBO.  PMH:  endometrial and colon CA, and low back pain   OT comments  Pt agreeable to getting up to chair and taking a few steps over this with RW. Did not want to perform any standing activities nor adls today.    Follow Up Recommendations  SNF    Equipment Recommendations  None recommended by OT    Recommendations for Other Services      Precautions / Restrictions Precautions Precautions: Fall Precaution Comments: bilat nephrostomy tubes that  she attaches to  thigh with strap,colostomy Restrictions Weight Bearing Restrictions: No       Mobility Bed Mobility               General bed mobility comments: supine to sit with HOB raised, min A  Transfers   Equipment used: Rolling walker (2 wheeled)   Sit to Stand: Min assist         General transfer comment: Assist to stand and steady    Balance                                           ADL either performed or assessed with clinical judgement   ADL                           Toilet Transfer: Minimal assistance;Ambulation;RW(chair)             General ADL Comments: ambulated 4 feet to recliner.  Pt did not want to perform any adls this afternoon. She was pleased that she was able to walk a few steps with steadying assistance     Vision       Perception     Praxis      Cognition Arousal/Alertness: Awake/alert Behavior During Therapy: WFL for tasks assessed/performed Overall Cognitive Status: Within Functional Limits for tasks assessed                                          Exercises     Shoulder Instructions       General Comments VSS    Pertinent Vitals/ Pain       Pain Score: 4  Pain Location: back Pain Descriptors / Indicators:  Sore Pain Intervention(s): Limited activity within patient's tolerance;Monitored during session;Repositioned  Home Living                                          Prior Functioning/Environment              Frequency           Progress Toward Goals  OT Goals(current goals can now be found in the care plan section)  Progress towards OT goals: Progressing toward goals     Plan      Co-evaluation                 AM-PAC PT "6 Clicks" Daily Activity     Outcome Measure   Help from  another person eating meals?: None Help from another person taking care of personal grooming?: A Little Help from another person toileting, which includes using toliet, bedpan, or urinal?: A Lot Help from another person bathing (including washing, rinsing, drying)?: A Lot Help from another person to put on and taking off regular upper body clothing?: A Little Help from another person to put on and taking off regular lower body clothing?: Total 6 Click Score: 15    End of Session    OT Visit Diagnosis: Muscle weakness (generalized) (M62.81)   Activity Tolerance Patient tolerated treatment well   Patient Left in chair;with call bell/phone within reach   Nurse Communication (up in chair; will call for back to bed; no alarm)        Time: 1430-1446 OT Time Calculation (min): 16 min  Charges: OT General Charges $OT Visit: 1 Visit OT Treatments $Therapeutic Activity: 8-22 mins  [name], OTR/L [phone] 02/27/2018   [name] 02/27/2018, 2:53 PM

## 2018-02-27 NOTE — Progress Notes (Signed)
[facility] Progress Note     Subjective: CC-  Feels about the same. States that she did eat more for dinner last night than she has in a while, spaghetti and macaroni. She drank 2 Ensure. Denies n/v. Having good output from ostomy.  PT recommending SNF.  Objective: Vital signs in last 24 hours: Temp:  [97.8 F (36.6 C)-99 F (37.2 C)] 98.5 F (36.9 C) (07/23 0400) Pulse Rate:  [52-84] 52 (07/22 2000) Resp:  [15-45] 21 (07/23 0700) BP: (82-138)/(24-71) 125/41 (07/23 0700) SpO2:  [92 %-100 %] 92 % (07/22 2000) Last BM Date: 02/26/18  Intake/Output from previous day: 07/22 0701 - 07/23 0700 In: 1390 [P.O.:360; I.V.:1030] Out: 2600 [Urine:1725; Stool:875] Intake/Output this shift: No intake/output data recorded.  PE: Gen:  Alert, NAD, pleasant HEENT: EOM's intact, pupils equal and round Card:  RRR Pulm:  CTAB, no W/R/R, effort normal Abd: Soft, NT/ND, +BS, soft stool in ostomy pouch Ext:  muslce wasting noted to all 4 extremities. No calf edema Psych: A&Ox3  Skin: no rashes noted, warm and dry  Lab Results:  Recent Labs    02/26/18 0457 02/27/18 0422  WBC 4.4 3.5*  HGB 8.5* 8.7*  HCT 27.5* 27.9*  PLT 166 159   BMET Recent Labs    02/26/18 0457 02/27/18 0422  NA 135 143  K 4.1 4.7  CL 106 114*  CO2 23 24  GLUCOSE 102* 83  BUN 34* 33*  CREATININE 1.34* 1.39*  CALCIUM 9.0 9.4   PT/INR No results for input(s): LABPROT, INR in the last 72 hours. CMP     Component Value Date/Time   NA 143 02/27/2018 0422   K 4.7 02/27/2018 0422   CL 114 (H) 02/27/2018 0422   CO2 24 02/27/2018 0422   GLUCOSE 83 02/27/2018 0422   BUN 33 (H) 02/27/2018 0422   CREATININE 1.39 (H) 02/27/2018 0422   CALCIUM 9.4 02/27/2018 0422   CALCIUM 9.8 11/21/2006 2218   PROT 5.2 (L) 02/26/2018 0457   ALBUMIN 1.8 (L) 02/26/2018 0457   AST 21 02/26/2018 0457   ALT 20 02/26/2018 0457   ALKPHOS 162 (H) 02/26/2018 0457   BILITOT 0.3 02/26/2018 0457   GFRNONAA 33 (L)  02/27/2018 0422   GFRAA 38 (L) 02/27/2018 0422   Lipase     Component Value Date/Time   LIPASE 20 02/15/2018 1819       Studies/Results: No results found.  Anti-infectives: Anti-infectives (From admission, onward)   Start     Dose/Rate Route Frequency Ordered Stop   02/20/18 1600  ampicillin-sulbactam (UNASYN) 1.5 g in sodium chloride 0.9 % 100 mL IVPB     1.5 g 200 mL/hr over 30 Minutes Intravenous Every 12 hours 02/20/18 1134 02/22/18 0531   02/16/18 0400  piperacillin-tazobactam (ZOSYN) IVPB 2.25 g  Status:  Discontinued     2.25 g 100 mL/hr over 30 Minutes Intravenous Every 6 hours 02/16/18 0009 02/20/18 1134   02/15/18 2215  piperacillin-tazobactam (ZOSYN) IVPB 3.375 g     3.375 g 100 mL/hr over 30 Minutes Intravenous  Once 02/15/18 2202 02/15/18 2333       Assessment/Plan Hx of colon cancer Hx of endometrial cancer Radiation fibrosis Anemia of chronic disease - Hg 8.7 s/p 2uPRBCs, monitor Severe protein calorie malnutrition - prealbumin 8.8 (7/22) Ureteral stenosis and hx of nephrolithiasis - b/l nephrostomy tubes present Depression  Choledocholithiasis -outpatient GI follow up, no signs of obstruction or cholangitis  SBO- multiple past abdominal surgeries, likely due to adhesive  disease - CT 7/11:sbo with transition within the pelvis, likely adhesions with ?mass in pelvis - on gyn/onc exam - found large cavity in the pelvis with open vaginal cuff - presumably from prior radiation? - CT 7/18: Mildly dilated loops of small bowel with air-fluid levels suspicious for mild small bowel obstruction; complex pelvic appearance was that the irregular rim enhancing structure posterior to the severely inflamed urinary bladder likely reflects a distended and markedly inflamed vaginal vault rather than a separate abscess or mass - tolerating soft diet and having good ostomy output  FEN: soft diet, Ensure TID VTE: SQ heparin  ID: Zosyn 7/11>7/16; Unasyn  7/16>7/18  Plan: Tolerating diet and having good bowel function. No surgical plans at this time. Continue current plan.   LOS: 11 days    Wellington [place] , [facility] 02/27/2018, 7:34 AM Pager: [phone] Consults: [phone] Mon 7:00 am -11:30 AM Tues-Fri 7:00 am-4:30 pm Sat-Sun 7:00 am-11:30 am

## 2018-02-27 NOTE — Progress Notes (Signed)
Nutrition Follow-up  DOCUMENTATION CODES:   Underweight, Severe malnutrition in context of chronic illness  INTERVENTION:  - Continue Ensure Enlive TID, each supplement provides 350 kcal and 20 grams of protein. - Continue to encourage PO intakes.  - RD will continue to monitor for additional nutrition-related needs. - Will order for weight to be obtained today.    NUTRITION DIAGNOSIS:   Severe Malnutrition related to chronic illness, poor appetite(history of endometrial and sigmoidal cancer, colostomy) as evidenced by severe fat depletion, severe muscle depletion, moderate muscle depletion. -ongoing  GOAL:   Patient will meet greater than or equal to 90% of their needs -unmet at this time, PO intakes improving.   MONITOR:   PO intake, Supplement acceptance, Weight trends, Labs, I & O's  ASSESSMENT:   84 year old female who presented to ED with colostomy blockage. PMH significant for multiple intra-abdominal surgeries and colon perforation, anal fistula, anemia, depression, nephrolithiasis s/p bilateral nephrostomy tube placement, endometrial cancer s/p hysterectomy, and sigmoidal cancer s/p partial bowel resection.  No new weight since 7/14. Diet advanced from NPO to CLD on 7/19 at 1:38 PM, from CLD to [place] on 7/20 at 8:06 AM, and from [place] to Soft on 7/21 at 7:41 AM. Per chart review, patient consumed 60% of breakfast and 25% of dinner on 7/21 (total of 686 kcal, 25 grams of protein) and yesterday she ate 50% of breakfast, skipped lunch, and ate 25% of dinner (total of 580 kcal, 18 grams of protein).   TPN was d/c'ed yesterday (7/22). Double lumen PICC placed 02/19/18 in L brachial and remains in place. Ensure Enlive was added TID yesterday and patient consumed 2 of the 3 bottles offered. She likes this supplement and is open to continuing to receive them and drink them as much as she can. She denies N/V or abdominal pain with or without PO intakes. Surgery note from this AM states that  patient is having good output from ostomy.     Medications reviewed; 20 mg oral Pepcid/day, 15 ml liquid multivitamin/day.  Labs reviewed; Cl: 114 mmol/L, BUN: 33 mg/dL, creatinine: 1.39 mg/dL, GFR: 33 mL/min.      Diet Order:   Diet Order           DIET SOFT Room service appropriate? Yes; Fluid consistency: Thin  Diet effective now          EDUCATION NEEDS:   No education needs have been identified at this time  Skin:  Skin Assessment: Skin Integrity Issues: Skin Integrity Issues:: Stage II Stage II: sacrum  Last BM:  7/22  Height:   Ht Readings from Last 1 Encounters:  02/18/18 5\' 4"  (1.626 m)    Weight:   Wt Readings from Last 1 Encounters:  02/18/18 92 lb (41.7 kg)    Ideal Body Weight:  56.82 kg  BMI:  Body mass index is 15.79 kg/m.  Estimated Nutritional Needs:   Kcal:  1400-1600 kcal/day  Protein:  65-80 grams/day  Fluid:  1.4-1.6 L/day     Jarome Matin, MS, RD, LDN, [facility] Inpatient Clinical Dietitian Pager # [phone] After hours/weekend pager # [phone]

## 2018-02-27 NOTE — Progress Notes (Addendum)
PROGRESS NOTE    [name]  BSJ:[id number] DOB: 1927/09/03 DOA: 02/15/2018 PCP: Cassandria Anger, MD     Brief Narrative:  [name] is a 84 y.o.femalewith medical history significant ofendometrial cancer s/p hysterectomy and radiation more than 30 years ago, sigmoidal cancer s/p partial bowel resection, ileostomy and colostomy bag placement, nephrolithiasis s/p bilateral nephrostomy tube placement, severe malnutrition, and insomnia, presented with complaints of worsening abdominal pain and distension of one week duration.CT abd pelvis w no contrast done on admission revealed SBO and suspected intra abdominal abscess vs necrotic mass. Choledocholithiasis and cholelithiasis with normal LFTs.  Admitted for small bowel obstruction. General surgery consulted and following.  Hospitalization also complicated by sinus bradycardia and severe malnutrition.  Cardiology was consulted who recommended treating malnutrition to improve sinus bradycardia. Patient continued to improve and diet slowly advanced. TPN stopped 7/22.   New events last 24 hours / Subjective: Doing well this morning, tolerating soft diet. Denies nausea, vomiting, abdominal pain.    Assessment & Plan:   Principal Problem:   SBO (small bowel obstruction) (HCC) Active Problems:   CANCER, ENDOMETRIUM   FISTULA, ANAL   Abdominal pain, generalized   Insomnia   Colostomy in place (HCC)   Nausea   Abdominal distension   Abnormal CT of the abdomen   Hypokalemia   Hypoalbuminemia   Severe protein-calorie malnutrition (HCC)   Microcytic anemia   Acute renal failure superimposed on stage 3 chronic kidney disease (HCC)   Hypotension   Small bowel obstruction, suspect secondary to adhesions from multiple abdominal surgeries -General surgery following -Now tolerating soft diet, TPN to be discontinued  Intra-abdominal abscess versus necrotic mass/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 Drainage deferred -Completed 8 days of antibiotics 7/11-7/16 Zosyn, 7/16-7/18 Unasyn   Bilateral pleural effusion  -CXR independently reviewed 7/17, showed bilateral interstitial edema with small bilateral pleural effusions. Improved with IV lasix  -Stop lasix. Patient had low BP and was given IVF. She now remains off [place] O2 without respiratory distress   Asymptomatic sinus bradycardia  -Cardiology consulted, suspects 2/2 to severe malnutrition. Now signed off.  -Resolved. Normal sinus rate 70-80s this morning with PVCs.   Severe protein calorie malnutrition -TPN discontinued 7/23 -Dietitian following   CKD 4 -Baseline Cr appears to be 1.5 -Stable  Hx of endometrial cancer s/p radiation  -Radiation done more than 30 years ago  Hx of colon cancer s/p resection and colostomy  Hx of nephrolithiasis s/p nephrostomy tube placement  Chronic anemia/iron deficiency anemia -FOBT negative -Status post 2 units PRBC transfusion -Hemoglobin 6.1 on presentation -Transfused 2 units PRBC on 02/16/2018 -Hg stable  Cholelithiasis/choledocholithiasis -No sign of obstructive jaundice or transaminitis -GI will follow outpatient   DVT prophylaxis: Subq hep  Code Status: Full Family Communication: No family at bedside Disposition Plan: Transfer to Med Surg today. Will need SNF placement    Consultants:   General surgery  Cardiology  Radiation oncology  Gyn oncology  Palliative care team   IR  Procedures:   PICC 7/15   Antimicrobials:  Anti-infectives (From admission, onward)   Start     Dose/Rate Route Frequency Ordered Stop    02/20/18 1600  ampicillin-sulbactam (UNASYN) 1.5 g in sodium chloride 0.9 % 100 mL IVPB     1.5 g 200 mL/hr over 30 Minutes Intravenous Every 12 hours 02/20/18 1134 02/22/18 0531   02/16/18 0400  piperacillin-tazobactam (ZOSYN) IVPB 2.25 g  Status:  Discontinued     2.25 g 100 mL/hr over 30 Minutes Intravenous Every 6 hours 02/16/18 0009 02/20/18 1134   02/15/18 2215  piperacillin-tazobactam (ZOSYN) IVPB 3.375 g     3.375 g 100 mL/hr over 30 Minutes Intravenous  Once 02/15/18 2202 02/15/18 2333       Objective: Vitals:   02/27/18 0500 02/27/18 0600 02/27/18 0700 02/27/18 0720  BP: (!) 125/34 (!) 117/39 (!) 125/41   Pulse:      Resp: 20 20 (!) 21   Temp:    98.7 F (37.1 C)  TempSrc:    Oral  SpO2:      Weight:      Height:        Intake/Output Summary (Last 24 hours) at 02/27/2018 1130 Last data filed at 02/27/2018 1003 Gross per 24 hour  Intake 1280 ml  Output 2350 ml  Net -1070 ml   Filed Weights   02/15/18 1740 02/16/18 0942 02/18/18 1130  Weight: 41.7 kg (92 lb) 40 kg (88 lb 2.9 oz) 41.7 kg (92 lb)   Examination: General exam: Appears calm and comfortable  Respiratory system: Clear to auscultation. Respiratory effort normal. Cardiovascular system: S1 & S2 heard, RRR. No JVD, murmurs, rubs, gallops or clicks. No pedal edema. Gastrointestinal system: Abdomen is nondistended, soft and nontender. No organomegaly or masses felt. Normal bowel sounds heard. +colostomy  Central nervous system: Alert and oriented. No focal neurological deficits. Extremities: Symmetric 5 x 5 power. Skin: No rashes, lesions or ulcers Psychiatry: Judgement and insight appear normal. Mood & affect appropriate.    Data Reviewed: I have personally reviewed following labs and imaging studies  CBC: Recent Labs  Lab 02/22/18 0422 02/24/18 0420 02/25/18 0459 02/26/18 0457 02/27/18 0422  WBC 7.9 5.2 5.0 4.4 3.5*  NEUTROABS  --   --   --  2.9  --   HGB 10.1* 9.4* 9.2* 8.5* 8.7*  HCT 32.1*  30.0* 29.7* 27.5* 27.9*  MCV 77.5* 77.9* 78.6 79.0 79.9  PLT 159 146* 146* 166 170   Basic Metabolic Panel: Recent Labs  Lab 02/22/18 0422 02/23/18 0240 02/24/18 0420 02/25/18 0459 02/26/18 0457 02/27/18 0422  NA 137 132* 135 134* 135 143  K 3.0* 3.9 3.5 4.0 4.1 4.7  CL 106 104 105 104 106 114*  CO2 23 24 25 25 23 24   GLUCOSE 122* 104* 103* 106* 102* 83  BUN 20 20 25* 30* 34* 33*  CREATININE 1.33* 1.29* 1.25* 1.40* 1.34* 1.39*  CALCIUM 8.4* 8.5* 8.4* 8.8* 9.0 9.4  MG 1.8 1.8 1.7 2.0 2.0 1.9  PHOS 2.9 2.4* 3.5 3.1 3.2  --    GFR: Estimated Creatinine Clearance: 18.4 mL/min (A) (by C-G formula based on SCr of 1.39 mg/dL (H)). Liver Function Tests: Recent Labs  Lab 02/21/18 0305 02/21/18 1352 02/22/18 0422 02/26/18 0457  AST 21 18 17 21   ALT 14 15 14 20   ALKPHOS 75 79 76 162*  BILITOT 0.4 0.4 0.4 0.3  PROT 4.8* 5.2* 5.0* 5.2*  ALBUMIN 1.8* 2.0* 1.9* 1.8*   No results for input(s): LIPASE, AMYLASE in the last 168 hours. No results for input(s): AMMONIA in the last 168 hours. Coagulation Profile: No results for input(s): INR, PROTIME in the last  168 hours. Cardiac Enzymes: Recent Labs  Lab 02/21/18 1352 02/21/18 2007  TROPONINI 0.10* 0.10*   BNP (last 3 results) No results for input(s): PROBNP in the last 8760 hours. HbA1C: No results for input(s): HGBA1C in the last 72 hours. CBG: Recent Labs  Lab 02/25/18 1748 02/25/18 2128 02/26/18 2142 02/27/18 0734 02/27/18 0822  GLUCAP 126* 132* 99 63* 101*   Lipid Profile: Recent Labs    02/26/18 0457  TRIG 34   Thyroid Function Tests: No results for input(s): TSH, T4TOTAL, FREET4, T3FREE, THYROIDAB in the last 72 hours. Anemia Panel: No results for input(s): VITAMINB12, FOLATE, FERRITIN, TIBC, IRON, RETICCTPCT in the last 72 hours. Sepsis Labs: Recent Labs  Lab 02/21/18 1352  LATICACIDVEN 1.7    Recent Results (from the past 240 hour(s))  MRSA PCR Screening     Status: None   Collection Time:  02/18/18 12:50 PM  Result Value Ref Range Status   MRSA by PCR NEGATIVE NEGATIVE Final    Comment:        The GeneXpert MRSA Assay (FDA approved for NASAL specimens only), is one component of a comprehensive MRSA colonization surveillance program. It is not intended to diagnose MRSA infection nor to guide or monitor treatment for MRSA infections. Performed at [facility], [place] [address], [place], [place] [address]   Body fluid culture     Status: None   Collection Time: 02/18/18  3:14 PM  Result Value Ref Range Status   Specimen Description   Final    HEMODIALYSIS FISTULA Performed at [facility], [place] [address] [place], [place] [address]    Special Requests   Final    Normal Performed at [facility], [place] [address] [place], [place] [address]    Gram Stain   Final    ABUNDANT WBC PRESENT, PREDOMINANTLY PMN ABUNDANT GRAM VARIABLE ROD Performed at [facility], [place] [address], [place], [place] [address]    Culture FEW ESCHERICHIA COLI FEW STREPTOCOCCUS GROUP F   Final   Report Status 02/22/2018 FINAL  Final   Organism ID, Bacteria ESCHERICHIA COLI  Final      Susceptibility   Escherichia coli - MIC*    AMPICILLIN 4 SENSITIVE Sensitive     CEFAZOLIN <=4 SENSITIVE Sensitive     CEFEPIME <=1 SENSITIVE Sensitive     CEFTAZIDIME <=1 SENSITIVE Sensitive     CEFTRIAXONE <=1 SENSITIVE Sensitive     CIPROFLOXACIN <=0.25 SENSITIVE Sensitive     GENTAMICIN <=1 SENSITIVE Sensitive     IMIPENEM <=0.25 SENSITIVE Sensitive     TRIMETH/SULFA <=20 SENSITIVE Sensitive     AMPICILLIN/SULBACTAM <=2 SENSITIVE Sensitive     PIP/TAZO <=4 SENSITIVE Sensitive     Extended ESBL NEGATIVE Sensitive     * FEW ESCHERICHIA COLI       Radiology Studies: No results found.    Scheduled Meds: . chlorhexidine  15 mL Mouth Rinse BID  . Chlorhexidine Gluconate Cloth  6 each Topical Daily  . famotidine  20 mg Oral QHS  .  feeding supplement (ENSURE ENLIVE)  237 mL Oral TID BM  . heparin injection (subcutaneous)  5,000 Units Subcutaneous Q8H  . mouth rinse  15 mL Mouth Rinse BID  . mirtazapine  15 mg Oral QHS  . multivitamin  15 mL Oral Daily  . sodium chloride flush  10-40 mL Intracatheter Q12H  . sodium chloride flush  3 mL Intravenous Q12H   Continuous Infusions: . sodium  chloride 10 mL/hr at 02/27/18 0500     LOS: 11 days    Time spent: 25 minutes   Dessa Phi, DO [facility] [email or web address] Password [email or web address] 02/27/2018, 11:30 AM

## 2018-02-27 NOTE — NC FL2 (Signed)
[place] LEVEL OF CARE SCREENING TOOL     IDENTIFICATION  Patient Name: [name] Birthdate: 12-Apr-1928 Sex: female Admission Date (Current Location): 02/15/2018  [facility] and [place] Number:  Herbalist and Address:  [facility],  [place], [place]      Provider Number: [id number]  Attending Physician Name and Address:  Dessa Phi, DO  Relative Name and Phone Number:       Current Level of Care: Hospital Recommended Level of Care: [place] Prior Approval Number:    Date Approved/Denied:   PASRR Number:    Discharge Plan:      Current Diagnoses: Patient Active Problem List   Diagnosis Date Noted  . Abnormal CT of the abdomen 02/16/2018  . Hypokalemia 02/16/2018  . Hypoalbuminemia 02/16/2018  . Severe protein-calorie malnutrition ([place]) 02/16/2018  . Microcytic anemia 02/16/2018  . Acute renal failure superimposed on stage 3 chronic kidney disease ([place]) 02/16/2018  . Hypotension 02/16/2018  . Colostomy in place [facility]) 02/15/2018  . SBO (small bowel obstruction) ([place]) 02/15/2018  . Nausea 02/15/2018  . Abdominal distension 02/15/2018  . Insomnia 02/14/2018  . Ganglion cyst 11/15/2017  . Macular degeneration (senile) of retina 05/10/2017  . Edema 01/31/2017  . Grieving 05/11/2016  . Well adult exam 01/29/2016  . Cramps, extremity 05/07/2012  . UTI (lower urinary tract infection) 03/07/2012  . Elevated LFTs 12/30/2011  . Colostomy dysfunction ([place]) 04/15/2011  . Abdominal pain, generalized 10/05/2010  . PARESTHESIA 09/15/2010  . WEIGHT LOSS 05/07/2010  . FLATULENCE 03/05/2010  . TOBACCO USE, QUIT 07/08/2009  . UNSPECIFIED PYELONEPHRITIS 12/07/2007  . Vitamin D deficiency 08/03/2007  . NEPHROLITHIASIS, HX OF 08/03/2007  . ANEMIA-NOS 04/27/2007  . LOW BACK PAIN 04/27/2007  . CANCER, ENDOMETRIUM 02/22/2007  . POLYP, COLON 02/22/2007  . B12 deficiency 02/22/2007  . Major depressive  disorder, recurrent episode, moderate ([place]) 02/22/2007  . FISTULA, ANAL 02/22/2007  . OBSTRUCTION, URETERIC NEC 02/22/2007  . HOT FLASHES 02/22/2007  . Osteoporosis 02/22/2007  . COLON CANCER, HX OF 02/22/2007    Orientation RESPIRATION BLADDER Height & Weight     Self, Time, Situation, Place  Normal Continent Weight: 92 lb (41.7 kg) Height:  5\' 4"  (162.6 cm)  BEHAVIORAL SYMPTOMS/MOOD NEUROLOGICAL BOWEL NUTRITION STATUS      Colostomy Diet(See discharge Summary )  AMBULATORY STATUS COMMUNICATION OF NEEDS Skin   Extensive Assist Verbally                         Personal Care Assistance Level of Assistance  Bathing, Feeding, Dressing Bathing Assistance: Limited assistance Feeding assistance: Independent Dressing Assistance: Limited assistance     Functional Limitations Info  Sight, Hearing, Speech Sight Info: Adequate Hearing Info: Adequate Speech Info: Adequate    SPECIAL CARE FACTORS FREQUENCY  PT (By licensed PT), OT (By licensed OT)     PT Frequency: 5x/week  OT Frequency: 5x/week             Contractures Contractures Info: Not present    Additional Factors Info  Code Status, Allergies, Psychotropic Code Status Info: DNR  Allergies Info: Allergies: Ciprofloxacin, Flagyl Metronidazole Hcl, Gabapentin, Raloxifene, Venlafaxine           Current Medications (02/27/2018):  This is the current hospital active medication list Current Facility-Administered Medications  Medication Dose Route Frequency Provider Last Rate Last Dose  . 0.9 %  sodium chloride infusion   Intravenous Continuous [place] Crane,  Carole N, DO 10 mL/hr at 02/27/18 0500    . chlorhexidine (PERIDEX) 0.12 % solution 15 mL  15 mL Mouth Rinse BID Hall, Carole N, DO   15 mL at 02/24/18 1011  . Chlorhexidine Gluconate Cloth 2 % PADS 6 each  6 each Topical Daily Kayleen Memos, DO   6 each at 02/26/18 2500  . famotidine (PEPCID) tablet 20 mg  20 mg Oral QHS Eudelia Bunch, RPH   20 mg at 02/26/18 2044   . feeding supplement (ENSURE ENLIVE) (ENSURE ENLIVE) liquid 237 mL  237 mL Oral TID BM Leodis Sias T, RPH   237 mL at 02/26/18 1322  . heparin injection 5,000 Units  5,000 Units Subcutaneous Q8H [place], Kelly A, PA-C   5,000 Units at 02/27/18 3704  . MEDLINE mouth rinse  15 mL Mouth Rinse BID Dessa Phi, DO      . mirtazapine (REMERON) tablet 15 mg  15 mg Oral QHS Dessa Phi, DO   15 mg at 02/26/18 2045  . multivitamin liquid 15 mL  15 mL Oral Daily Eudelia Bunch, RPH      . ondansetron [facility]) tablet 4 mg  4 mg Oral Q6H PRN Paticia Stack, MD       Or  . ondansetron [facility]) injection 4 mg  4 mg Intravenous Q6H PRN Paticia Stack, MD   4 mg at 02/20/18 0955  . oxyCODONE-acetaminophen (PERCOCET/ROXICET) 5-325 MG per tablet 1-2 tablet  1-2 tablet Oral Q4H PRN Dessa Phi, DO   2 tablet at 02/27/18 0751  . sodium chloride flush (NS) 0.9 % injection 10-40 mL  10-40 mL Intracatheter Q12H Hall, Carole N, DO   10 mL at 02/26/18 2048  . sodium chloride flush (NS) 0.9 % injection 10-40 mL  10-40 mL Intracatheter PRN Irene Pap N, DO   10 mL at 02/26/18 2047  . sodium chloride flush (NS) 0.9 % injection 3 mL  3 mL Intravenous Q12H Paticia Stack, MD   3 mL at 02/26/18 2048  . temazepam (RESTORIL) capsule 15 mg  15 mg Oral QHS PRN Dessa Phi, DO         Discharge Medications: Please see discharge summary for a list of discharge medications.  Relevant Imaging Results:  Relevant Lab Results:   Additional Information ssn:[id number]  Lia Hopping, LCSW

## 2018-02-28 DIAGNOSIS — C549 Malignant neoplasm of corpus uteri, unspecified: Secondary | ICD-10-CM

## 2018-02-28 DIAGNOSIS — D509 Iron deficiency anemia, unspecified: Secondary | ICD-10-CM

## 2018-02-28 LAB — CBC
HEMATOCRIT: 29.2 % — AB (ref 36.0–46.0)
HEMOGLOBIN: 8.8 g/dL — AB (ref 12.0–15.0)
MCH: 24.5 pg — AB (ref 26.0–34.0)
MCHC: 30.1 g/dL (ref 30.0–36.0)
MCV: 81.3 fL (ref 78.0–100.0)
Platelets: 175 10*3/uL (ref 150–400)
RBC: 3.59 MIL/uL — ABNORMAL LOW (ref 3.87–5.11)
RDW: 23.9 % — AB (ref 11.5–15.5)
WBC: 4 10*3/uL (ref 4.0–10.5)

## 2018-02-28 LAB — BASIC METABOLIC PANEL
Anion gap: 7 (ref 5–15)
BUN: 37 mg/dL — AB (ref 8–23)
CALCIUM: 9.5 mg/dL (ref 8.9–10.3)
CHLORIDE: 108 mmol/L (ref 98–111)
CO2: 24 mmol/L (ref 22–32)
CREATININE: 1.55 mg/dL — AB (ref 0.44–1.00)
GFR, EST AFRICAN AMERICAN: 33 mL/min — AB (ref >60)
GFR, EST NON AFRICAN AMERICAN: 29 mL/min — AB (ref >60)
Glucose, Bld: 93 mg/dL (ref 70–99)
Potassium: 4.8 mmol/L (ref 3.5–5.1)
SODIUM: 139 mmol/L (ref 135–145)

## 2018-02-28 LAB — MAGNESIUM: Magnesium: 2 mg/dL (ref 1.7–2.4)

## 2018-02-28 NOTE — Progress Notes (Signed)
PROGRESS NOTE [facility]   [place]   HOZ:[id number] DOB: 1927/12/24  DOA: 02/15/2018 PCP: Cassandria Anger, MD   Brief Narrative:  [name] is a 84 year old female with medical history significant of endometrial cancer status post hysterectomy and radiation for more than 30 years ago, sigmoidal cancer status post partial bowel resection and ileostomy and colostomy bag placement, nephrolithiasis status post bilateral nephrostomy tube placement, malnutrition and insomnia who presented to the emergency department complaining of abdominal pain and distention for 1 week prior to admission.  Upon ED evaluation CT A/P without contrast revealed SBO and suspected entero-necrotic mass.  Choledocholithiasis and cholelithiasis with normal LFTs.  Patient admitted with working diagnosis of small bowel obstruction.  General surgery was consulted and recommended conservative management.  Hospital stay was complicated by bradycardia cardiology was consulted, felt to be related to severe malnutrition, bradycardia has resolved. Patient was treated with TPN started on 7/15, this was discontinued on 7/22 after patient was able to tolerated oral diet and SBO resolved. Patient was found to have, intra-abdominal abscess versus enterovaginal fistula, gynecology and surgical team felt that she is not candidate for surgical procedure.  IR was consulted however the correlation was felt to be retrovesical space and drainage was before.  Patient was treated with antibiotic course with Zosyn and Unasyn for total of 8 days.  Body fluid culture revealed E. coli on 7/14.  Patient was also transfused 2 units of PRBC due to low hemoglobin on presentation to 6.1, after transfusion hemoglobin remains stable.  Surgical team cleared the patient for discharge and it was recommended short-term rehab.  Subjective: Patient seen and examined she has no complaints other than vaginal discharge.  Tolerating diet well.   Afebrile.  No abdominal pain.  Assessment & Plan:   Principal Problem:   SBO (small bowel obstruction) (HCC) Active Problems:   CANCER, ENDOMETRIUM   FISTULA, ANAL   Abdominal pain, generalized   Insomnia   Colostomy in place [facility])   Nausea   Abdominal distension   Abnormal CT of the abdomen   Hypokalemia   Hypoalbuminemia   Severe protein-calorie malnutrition (HCC)   Microcytic anemia   Acute renal failure superimposed on stage 3 chronic kidney disease (HCC)   Hypotension  SBO felt to be secondary to adhesions from multiple abdominal surgeries Patient was treated conservatively, TPN was given from 7/15 to 7/22.  Patient now tolerating soft diet and colostomy working properly.  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 coli due to continue complaining of vaginal rectal discharge repeated CT scan shows a ?  Pelvic abscess, IR was consulted however collection of retrovesical space was unable to be drained, recommended to treat with antibiotic.  Patient completed antibiotic course with initially Zosyn then transitioned to Unasyn for total of 8 days 7/11 through 7/18.  Bilateral pleural effusion Felt to be secondary to aggressive IV fluid hydration, patient was treated with IV Lasix and subsequently improved.  Patient remains off oxygen supplementation without any acute distress.  Continue to monitor  Asymptomatic sinus bradycardia Cardiology was consulted felt to be secondary to severe malnutrition this now has resolved.  Follow-up as an outpatient  Severe protein calorie malnutrition Patient was treated with TPN from 7/15 to 7/22 Now tolerating oral diet, continue protein supplementation.  Chronic anemia/iron deficiency anemia FOBT was negative Patient was transfused 2 units of PRBC during hospital stay. Remains stable.  CKD  stage IV Baseline creatinine around 1.5 remains  stable  History of colon cancer status post resection and colostomy  History of nephrolithiasis status post nephrectomy tube placement  History of endometrial cancer status post radiation  DVT prophylaxis: Heparin SQ Code Status: Full code Family Communication: No family at bedside Disposition Plan: Awaiting SNF   Consultants:   General surgery  Cardiology  Radiation oncology  GYN oncology  Palliative care team  IR  Procedures:   PICC line 7/15  Antimicrobials: Anti-infectives (From admission, onward)   Start     Dose/Rate Route Frequency Ordered Stop   02/20/18 1600  ampicillin-sulbactam (UNASYN) 1.5 g in sodium chloride 0.9 % 100 mL IVPB     1.5 g 200 mL/hr over 30 Minutes Intravenous Every 12 hours 02/20/18 1134 02/22/18 0531   02/16/18 0400  piperacillin-tazobactam (ZOSYN) IVPB 2.25 g  Status:  Discontinued     2.25 g 100 mL/hr over 30 Minutes Intravenous Every 6 hours 02/16/18 0009 02/20/18 1134   02/15/18 2215  piperacillin-tazobactam (ZOSYN) IVPB 3.375 g     3.375 g 100 mL/hr over 30 Minutes Intravenous  Once 02/15/18 2202 02/15/18 2333        Objective: Vitals:   02/27/18 2000 02/27/18 2105 02/28/18 0457 02/28/18 1326  BP: (!) 120/48 (!) 128/55 (!) 116/48 (!) 98/48  Pulse:  68 79 68  Resp: 18 18 16 16   Temp:  98.5 F (36.9 C) 99.6 F (37.6 C) 98.6 F (37 C)  TempSrc:  Oral Oral Oral  SpO2:  100% 97% 100%  Weight:      Height:        Intake/Output Summary (Last 24 hours) at 02/28/2018 1614 Last data filed at 02/28/2018 1200 Gross per 24 hour  Intake 360 ml  Output 1150 ml  Net -790 ml   Filed Weights   02/16/18 0942 02/18/18 1130 02/27/18 1038  Weight: 40 kg (88 lb 2.9 oz) 41.7 kg (92 lb) 41.6 kg (91 lb 11.4 oz)    Examination:  General exam: Appears calm and comfortable  Respiratory system: Clear to auscultation. No wheezes,crackle or rhonchi Cardiovascular system: S1 & S2 heard, RRR. No JVD, murmurs, rubs or  gallops Gastrointestinal system: Abdomen is nondistended, soft and nontender. No organomegaly or masses felt. Normal bowel sounds heard.  + colostomy bag with brown stool Central nervous system: Alert and oriented. No focal neurological deficits. Extremities: No pedal edema. Symmetric, strength 5/5   Skin: No rashes  Data Reviewed: I have personally reviewed following labs and imaging studies  CBC: Recent Labs  Lab 02/24/18 0420 02/25/18 0459 02/26/18 0457 02/27/18 0422 02/28/18 0404  WBC 5.2 5.0 4.4 3.5* 4.0  NEUTROABS  --   --  2.9  --   --   HGB 9.4* 9.2* 8.5* 8.7* 8.8*  HCT 30.0* 29.7* 27.5* 27.9* 29.2*  MCV 77.9* 78.6 79.0 79.9 81.3  PLT 146* 146* 166 159 211   Basic Metabolic Panel: Recent Labs  Lab 02/22/18 0422 02/23/18 0240 02/24/18 0420 02/25/18 0459 02/26/18 0457 02/27/18 0422 02/28/18 0404  NA 137 132* 135 134* 135 143 139  K 3.0* 3.9 3.5 4.0 4.1 4.7 4.8  CL 106 104 105 104 106 114* 108  CO2 23 24 25 25 23 24 24   GLUCOSE 122* 104* 103* 106* 102* 83 93  BUN 20 20 25* 30* 34* 33* 37*  CREATININE 1.33* 1.29* 1.25* 1.40* 1.34* 1.39* 1.55*  CALCIUM 8.4* 8.5* 8.4* 8.8* 9.0 9.4 9.5  MG 1.8 1.8 1.7  2.0 2.0 1.9 2.0  PHOS 2.9 2.4* 3.5 3.1 3.2  --   --    GFR: Estimated Creatinine Clearance: 16.5 mL/min (A) (by C-G formula based on SCr of 1.55 mg/dL (H)). Liver Function Tests: Recent Labs  Lab 02/22/18 0422 02/26/18 0457  AST 17 21  ALT 14 20  ALKPHOS 76 162*  BILITOT 0.4 0.3  PROT 5.0* 5.2*  ALBUMIN 1.9* 1.8*   No results for input(s): LIPASE, AMYLASE in the last 168 hours. No results for input(s): AMMONIA in the last 168 hours. Coagulation Profile: No results for input(s): INR, PROTIME in the last 168 hours. Cardiac Enzymes: Recent Labs  Lab 02/21/18 2007  TROPONINI 0.10*   BNP (last 3 results) No results for input(s): PROBNP in the last 8760 hours. HbA1C: No results for input(s): HGBA1C in the last 72 hours. CBG: Recent Labs  Lab  02/26/18 2142 02/27/18 0734 02/27/18 0822 02/27/18 1143 02/27/18 1618  GLUCAP 99 63* 101* 114* 141*   Lipid Profile: Recent Labs    02/26/18 0457  TRIG 34   Thyroid Function Tests: No results for input(s): TSH, T4TOTAL, FREET4, T3FREE, THYROIDAB in the last 72 hours. Anemia Panel: No results for input(s): VITAMINB12, FOLATE, FERRITIN, TIBC, IRON, RETICCTPCT in the last 72 hours. Sepsis Labs: No results for input(s): PROCALCITON, LATICACIDVEN in the last 168 hours.  No results found for this or any previous visit (from the past 240 hour(s)).    Radiology Studies: No results found.    Scheduled Meds: . chlorhexidine  15 mL Mouth Rinse BID  . Chlorhexidine Gluconate Cloth  6 each Topical Jimenez  . famotidine  20 mg Oral QHS  . feeding supplement (ENSURE ENLIVE)  237 mL Oral TID BM  . heparin injection (subcutaneous)  5,000 Units Subcutaneous Q8H  . mouth rinse  15 mL Mouth Rinse BID  . mirtazapine  15 mg Oral QHS  . multivitamin  15 mL Oral Jimenez  . sodium chloride flush  10-40 mL Intracatheter Q12H  . sodium chloride flush  3 mL Intravenous Q12H   Continuous Infusions: . sodium chloride 10 mL/hr at 02/27/18 0500     LOS: 12 days    Time spent: Total of 25 minutes spent with pt, greater than 50% of which was spent in discussion of  treatment, counseling and coordination of care  Chipper Oman, MD Pager: Text Page via [email or web address]   If 7PM-7AM, please contact night-coverage [email or web address] 02/28/2018, 4:14 PM   Note - This record has been created using [employer]. Chart creation errors have been sought, but may not always have been located. Such creation errors do not reflect on the standard of medical care.

## 2018-02-28 NOTE — Progress Notes (Signed)
Pt now agreeable to seeking SNF placement at DC. Provided bed offers and pt selects [facility]. CSW confirmed with admissions and SNF began insurance authorization request through [facility]. Pt states she will update her family on her plan.  Sharren Bridge, MSW, LCSW Clinical Social Work 02/28/2018 [phone]

## 2018-02-28 NOTE — Progress Notes (Signed)
Physical Therapy Treatment Patient Details Name: [name] MRN: [id number] DOB: 1928-03-12 Today's Date: 02/28/2018    History of Present Illness Patient is an 84 y/o female admitted for SBO.  PMH:  endometrial and colon CA, and low back pain    PT Comments    The patient pleased with ambulating today considering length of stay and First opportunty to ambulate.   Follow Up Recommendations  SNF;Supervision/Assistance - 24 hour     Equipment Recommendations  None recommended by PT    Recommendations for Other Services       Precautions / Restrictions Precautions Precautions: Fall Precaution Comments: bilat nephrostomy tubes that  she attaches with pins. change sani pad before ambulating. will leak if saturated.    Mobility  Bed Mobility     Rolling: Min assist   Supine to sit: Supervision     General bed mobility comments: supine to sit with HOB raised, min A  Transfers   Equipment used: 4-wheeled walker Transfers: Sit to/from Stand Sit to Stand: Min assist         General transfer comment: , extra effort to powerup.  Ambulation/Gait Ambulation/Gait assistance: Min assist Gait Distance (Feet): 100 Feet(x 2, stopped for rest break.) Assistive device: 4-wheeled walker Gait Pattern/deviations: Step-through pattern;Drifts right/left     General Gait Details: extra time to ambulate distance.    Stairs             Wheelchair Mobility    Modified Rankin (Stroke Patients Only)       Balance       Sitting balance - Comments: able to sit unaided,    Standing balance support: During functional activity;No upper extremity supported   Standing balance comment: sttod to cnange padd , pull up briefs. and to empty colostomy with min assist.                            Cognition Arousal/Alertness: Awake/alert                                            Exercises      General Comments        Pertinent  Vitals/Pain Pain Assessment: No/denies pain    Home Living                      Prior Function            PT Goals (current goals can now be found in the care plan section) Progress towards PT goals: Progressing toward goals    Frequency    Min 3X/week      PT Plan Current plan remains appropriate    Co-evaluation              AM-PAC PT "6 Clicks" Daily Activity  Outcome Measure  Difficulty turning over in bed (including adjusting bedclothes, sheets and blankets)?: A Lot Difficulty moving from lying on back to sitting on the side of the bed? : A Lot Difficulty sitting down on and standing up from a chair with arms (e.g., wheelchair, bedside commode, etc,.)?: A Lot Help needed moving to and from a bed to chair (including a wheelchair)?: A Lot Help needed walking in hospital room?: A Lot Help needed climbing 3-5 steps with a railing? : Total 6 Click Score: 11  End of Session   Activity Tolerance: Patient tolerated treatment well Patient left: in bed;with call bell/phone within reach Nurse Communication: Mobility status PT Visit Diagnosis: Muscle weakness (generalized) (M62.81);Other abnormalities of gait and mobility (R26.89)     Time: 5521-7471 PT Time Calculation (min) (ACUTE ONLY): 34 min  Charges:  $Gait Training: 8-22 mins $Self Care/Home Management: 8-22                    G CodesTresa Endo PT [phone]    Claretha Cooper 02/28/2018, 4:41 PM

## 2018-02-28 NOTE — Progress Notes (Signed)
[facility]/Trauma Progress Note      Assessment/Plan Hx of colon cancer Hx of endometrial cancer Radiation fibrosis Anemia of chronic disease - Hg 8.7 s/p 2uPRBCs, monitor Severe protein calorie malnutrition- prealbumin 8.8 (7/22) Ureteral stenosis and hx of nephrolithiasis - b/l nephrostomy tubes present Depression  Choledocholithiasis -outpatient GI follow up, no signs of obstruction or cholangitis  SBO- multiple past abdominal surgeries, likely due to adhesive disease - CT 7/11:sbo with transition within the pelvis, likely adhesions with ?mass in pelvis - on gyn/onc exam - found large cavity in the pelvis with open vaginal cuff - presumably from prior radiation? -CT 7/18:Mildly dilated loops of small bowel with air-fluid levels suspicious for mild small bowel obstruction;complex pelvic appearance was that the irregular rim enhancing structure posterior to the severely inflamed urinary bladder likely reflects a distended and markedly inflamed vaginal vault rather than a separate abscess or mass - tolerating soft diet and having good ostomy output  AST:MHDQ diet, Ensure TID VTE: SQ heparin  ID: Zosyn 7/11>7/16; Unasyn 7/16>7/18  Plan: Tolerating diet and having good bowel function. No surgical plans at this time. Okay for discharge from a surgical standpoint.     LOS: 12 days    Subjective: CC: vaginal discharge  Pt is eating, having bowel function. No nausea or vomiting. Tolerating diet. Her main complaint is the vaginal discharge. I discussed that there is not a good surgical option to correct this and ideally it will resolve in time.   Objective: Vital signs in last 24 hours: Temp:  [97.6 F (36.4 C)-99.6 F (37.6 C)] 99.6 F (37.6 C) (07/24 0457) Pulse Rate:  [68-79] 79 (07/24 0457) Resp:  [16-33] 16 (07/24 0457) BP: (96-143)/(34-55) 116/48 (07/24 0457) SpO2:  [95 %-100 %] 97 % (07/24 0457) Weight:  [41.6 kg (91 lb 11.4 oz)] 41.6 kg (91 lb  11.4 oz) (07/23 1038) Last BM Date: 02/28/18  Intake/Output from previous day: 07/23 0701 - 07/24 0700 In: 1150 [P.O.:1140; I.V.:10] Out: 1250 [Urine:550; Stool:700] Intake/Output this shift: No intake/output data recorded.  PE: Gen:  Alert, NAD, pleasant, cooperative Pulm:  Rate and effort normal Abd: Soft, NT/ND, +BS, brown stool in bag Skin: no rashes noted, warm and dry   Anti-infectives: Anti-infectives (From admission, onward)   Start     Dose/Rate Route Frequency Ordered Stop   02/20/18 1600  ampicillin-sulbactam (UNASYN) 1.5 g in sodium chloride 0.9 % 100 mL IVPB     1.5 g 200 mL/hr over 30 Minutes Intravenous Every 12 hours 02/20/18 1134 02/22/18 0531   02/16/18 0400  piperacillin-tazobactam (ZOSYN) IVPB 2.25 g  Status:  Discontinued     2.25 g 100 mL/hr over 30 Minutes Intravenous Every 6 hours 02/16/18 0009 02/20/18 1134   02/15/18 2215  piperacillin-tazobactam (ZOSYN) IVPB 3.375 g     3.375 g 100 mL/hr over 30 Minutes Intravenous  Once 02/15/18 2202 02/15/18 2333      Lab Results:  Recent Labs    02/27/18 0422 02/28/18 0404  WBC 3.5* 4.0  HGB 8.7* 8.8*  HCT 27.9* 29.2*  PLT 159 175   BMET Recent Labs    02/27/18 0422 02/28/18 0404  NA 143 139  K 4.7 4.8  CL 114* 108  CO2 24 24  GLUCOSE 83 93  BUN 33* 37*  CREATININE 1.39* 1.55*  CALCIUM 9.4 9.5   PT/INR No results for input(s): LABPROT, INR in the last 72 hours. CMP     Component Value Date/Time   NA 139 02/28/2018  0404   K 4.8 02/28/2018 0404   CL 108 02/28/2018 0404   CO2 24 02/28/2018 0404   GLUCOSE 93 02/28/2018 0404   BUN 37 (H) 02/28/2018 0404   CREATININE 1.55 (H) 02/28/2018 0404   CALCIUM 9.5 02/28/2018 0404   CALCIUM 9.8 11/21/2006 2218   PROT 5.2 (L) 02/26/2018 0457   ALBUMIN 1.8 (L) 02/26/2018 0457   AST 21 02/26/2018 0457   ALT 20 02/26/2018 0457   ALKPHOS 162 (H) 02/26/2018 0457   BILITOT 0.3 02/26/2018 0457   GFRNONAA 29 (L) 02/28/2018 0404   GFRAA 33 (L)  02/28/2018 0404   Lipase     Component Value Date/Time   LIPASE 20 02/15/2018 1819    Studies/Results: No results found.    Kalman Drape , [facility] 02/28/2018, 7:48 AM  Pager: [phone] Mon-Wed, Friday 7:00am-4:30pm Thurs 7am-11:30am  Consults: [phone]

## 2018-03-01 DIAGNOSIS — R41841 Cognitive communication deficit: Secondary | ICD-10-CM | POA: Diagnosis not present

## 2018-03-01 DIAGNOSIS — G8929 Other chronic pain: Secondary | ICD-10-CM | POA: Diagnosis not present

## 2018-03-01 DIAGNOSIS — K5651 Intestinal adhesions [bands], with partial obstruction: Secondary | ICD-10-CM | POA: Diagnosis not present

## 2018-03-01 DIAGNOSIS — E43 Unspecified severe protein-calorie malnutrition: Secondary | ICD-10-CM | POA: Diagnosis not present

## 2018-03-01 DIAGNOSIS — R531 Weakness: Secondary | ICD-10-CM | POA: Diagnosis not present

## 2018-03-01 DIAGNOSIS — R5381 Other malaise: Secondary | ICD-10-CM | POA: Diagnosis not present

## 2018-03-01 DIAGNOSIS — M6281 Muscle weakness (generalized): Secondary | ICD-10-CM | POA: Diagnosis not present

## 2018-03-01 DIAGNOSIS — R2689 Other abnormalities of gait and mobility: Secondary | ICD-10-CM | POA: Diagnosis not present

## 2018-03-01 DIAGNOSIS — K56609 Unspecified intestinal obstruction, unspecified as to partial versus complete obstruction: Secondary | ICD-10-CM | POA: Diagnosis not present

## 2018-03-01 DIAGNOSIS — M2550 Pain in unspecified joint: Secondary | ICD-10-CM | POA: Diagnosis not present

## 2018-03-01 DIAGNOSIS — R498 Other voice and resonance disorders: Secondary | ICD-10-CM | POA: Diagnosis not present

## 2018-03-01 DIAGNOSIS — N135 Crossing vessel and stricture of ureter without hydronephrosis: Secondary | ICD-10-CM | POA: Diagnosis not present

## 2018-03-01 DIAGNOSIS — C549 Malignant neoplasm of corpus uteri, unspecified: Secondary | ICD-10-CM | POA: Diagnosis not present

## 2018-03-01 DIAGNOSIS — Z933 Colostomy status: Secondary | ICD-10-CM | POA: Diagnosis not present

## 2018-03-01 DIAGNOSIS — R29898 Other symptoms and signs involving the musculoskeletal system: Secondary | ICD-10-CM | POA: Diagnosis not present

## 2018-03-01 DIAGNOSIS — M545 Low back pain: Secondary | ICD-10-CM | POA: Diagnosis not present

## 2018-03-01 DIAGNOSIS — D509 Iron deficiency anemia, unspecified: Secondary | ICD-10-CM | POA: Diagnosis not present

## 2018-03-01 DIAGNOSIS — R0600 Dyspnea, unspecified: Secondary | ICD-10-CM | POA: Diagnosis not present

## 2018-03-01 DIAGNOSIS — R278 Other lack of coordination: Secondary | ICD-10-CM | POA: Diagnosis not present

## 2018-03-01 DIAGNOSIS — K651 Peritoneal abscess: Secondary | ICD-10-CM | POA: Diagnosis not present

## 2018-03-01 DIAGNOSIS — Z7401 Bed confinement status: Secondary | ICD-10-CM | POA: Diagnosis not present

## 2018-03-01 MED ORDER — OXYCODONE-ACETAMINOPHEN 10-325 MG PO TABS: 1.0000 | ORAL_TABLET | Freq: Four times a day (QID) | ORAL | 0 refills | Status: AC | PRN

## 2018-03-01 MED ORDER — POLYSACCHARIDE IRON COMPLEX 150 MG PO CAPS: 150.0000 mg | ORAL_CAPSULE | Freq: Every day | ORAL | Status: AC

## 2018-03-01 MED ORDER — ENSURE ENLIVE PO LIQD: 237.0000 mL | Freq: Three times a day (TID) | ORAL | 12 refills | Status: AC

## 2018-03-01 MED ORDER — FAMOTIDINE 20 MG PO TABS: 20.0000 mg | ORAL_TABLET | Freq: Every day | ORAL | Status: AC

## 2018-03-01 MED ORDER — TEMAZEPAM 30 MG PO CAPS: ORAL_CAPSULE | ORAL | 0 refills | Status: AC

## 2018-03-01 NOTE — Clinical Social Work Placement (Signed)
Pt admitting to [facility] - report [phone] [facility] transportation arranged DC information sent via the [place] and facility obtained [facility] authorization approved. Unable to reach pt's daughter; left voicemail and pt states she will call her as well   CLINICAL SOCIAL WORK PLACEMENT  NOTE  Date:  03/01/2018  Patient Details  Name: [name] MRN: [id number] Date of Birth: Jul 29, 1928  Clinical Social Work is seeking post-discharge placement for this patient at the [place] level of care (*CSW will initial, date and re-position this form in  chart as items are completed):  Yes   Patient/family provided with [facility] Work Department's list of facilities offering this level of care within the geographic area requested by the patient (or if unable, by the patient's family).  Yes   Patient/family informed of their freedom to choose among providers that offer the needed level of care, that participate in Medicare, Medicaid or managed care program needed by the patient, have an available bed and are willing to accept the patient.  Yes   Patient/family informed of [facility]'s ownership interest in [facility] and [facility], as well as of the fact that they are under no obligation to receive care at these facilities.  PASRR submitted to EDS on 02/27/18     PASRR number received on 02/27/18     Existing PASRR number confirmed on       FL2 transmitted to all facilities in geographic area requested by pt/family on 02/27/18     FL2 transmitted to all facilities within larger geographic area on       Patient informed that his/her managed care company has contracts with or will negotiate with certain facilities, including the following:        Yes   Patient/family informed of bed offers received.  Patient [facility] bed [facility]     Physician recommends and patient chooses bed at [facility]    Patient to be transferred to  [facility] on 03/01/18.  Patient to be transferred to facility by [facility]     Patient family notified on 03/01/18 of transfer.  Name of family member notified:  pt states she is responsible to notify family     PHYSICIAN       Additional Comment:    _______________________________________________ Nila Nephew, LCSW 03/01/2018, 1:42 PM  [phone]

## 2018-03-01 NOTE — Final Consult Note (Signed)
Consultant Final Sign-Off Note    Assessment/Final recommendations  [name] is a 84 y.o. female followed by me for SBO   Wound care (if applicable):    Diet at discharge: reg diet   Activity at discharge: per primary team   Follow-up appointment:  As needed   Pending results:  Unresulted Labs (From admission, onward)   None       Medication recommendations: none   Other recommendations:    Thank you for allowing [place] to participate in the care of your patient!  Please consult [place] again if you have further needs for your patient.  Kalman Drape 03/01/2018 7:47 AM    Subjective   CC: SBO, vaginal discharge  Pt states she is eating well, no abdominal pain, nausea or vomiting. Issues overnight with ostomy leaking. No fever. Pt is ready to go to rehab.   Objective  Vital signs in last 24 hours: Temp:  [98.3 F (36.8 C)-98.6 F (37 C)] 98.6 F (37 C) (07/25 0639) Pulse Rate:  [68-73] 69 (07/25 0639) Resp:  [16] 16 (07/25 0639) BP: (98-115)/(48-64) 115/55 (07/25 0639) SpO2:  [100 %] 100 % (07/25 4196)  PE: Gen:  Alert, NAD, pleasant, cooperative Pulm:  Rate and effort normal Abd: Soft, NT/ND, +BS, new bag, ostomy pink Skin: no rashes noted, warm and dry  Pertinent labs and Studies: Recent Labs    02/27/18 0422 02/28/18 0404  WBC 3.5* 4.0  HGB 8.7* 8.8*  HCT 27.9* 29.2*   BMET Recent Labs    02/27/18 0422 02/28/18 0404  NA 143 139  K 4.7 4.8  CL 114* 108  CO2 24 24  GLUCOSE 83 93  BUN 33* 37*  CREATININE 1.39* 1.55*  CALCIUM 9.4 9.5   No results for input(s): LABURIN in the last 72 hours. Results for orders placed or performed during the hospital encounter of 02/15/18  Blood culture (routine x 2)     Status: None   Collection Time: 02/15/18 10:20 PM  Result Value Ref Range Status   Specimen Description   Final    BLOOD BLOOD RIGHT FOREARM Performed at [facility] [address] [place], [place] [address]    Special Requests   Final    BOTTLES DRAWN AEROBIC AND ANAEROBIC Blood Culture adequate volume Performed at [facility] [address], [place], [place] [address]    Culture   Final    NO GROWTH 5 DAYS Performed at [facility], [place] [address] [place], [place] [address]    Report Status 02/21/2018 FINAL  Final  Blood culture (routine x 2)     Status: None   Collection Time: 02/15/18 10:25 PM  Result Value Ref Range Status   Specimen Description   Final    BLOOD RIGHT ANTECUBITAL Performed at [facility] [address], [place], [place] [address]    Special Requests   Final    BOTTLES DRAWN AEROBIC AND ANAEROBIC Blood Culture adequate volume Performed at [facility] [address] [place], [place] [address]    Culture   Final    NO GROWTH 5 DAYS Performed at [facility], [place] [address], [place], [place] [address]    Report Status 02/21/2018 FINAL  Final  MRSA PCR Screening     Status: None   Collection Time: 02/18/18 12:50 PM  Result Value Ref Range Status   MRSA by PCR NEGATIVE NEGATIVE Final  Comment:        The GeneXpert MRSA Assay (FDA approved for NASAL specimens only), is one component of a comprehensive MRSA colonization surveillance program. It is not intended to diagnose MRSA infection nor to guide or monitor treatment for MRSA infections. Performed at [facility], [place] [address], [place], [place] [address]   Body fluid culture     Status: None   Collection Time: 02/18/18  3:14 PM  Result Value Ref Range Status   Specimen Description   Final    HEMODIALYSIS FISTULA Performed at [facility], [place] [address], [place], [place] [address]    Special Requests   Final    Normal Performed at [facility], [place] [address] [place], [place] [address]    Gram Stain   Final    ABUNDANT WBC PRESENT, PREDOMINANTLY PMN ABUNDANT GRAM  VARIABLE ROD Performed at [facility], [place] [address], [place], [place] [address]    Culture FEW ESCHERICHIA COLI FEW STREPTOCOCCUS GROUP F   Final   Report Status 02/22/2018 FINAL  Final   Organism ID, Bacteria ESCHERICHIA COLI  Final      Susceptibility   Escherichia coli - MIC*    AMPICILLIN 4 SENSITIVE Sensitive     CEFAZOLIN <=4 SENSITIVE Sensitive     CEFEPIME <=1 SENSITIVE Sensitive     CEFTAZIDIME <=1 SENSITIVE Sensitive     CEFTRIAXONE <=1 SENSITIVE Sensitive     CIPROFLOXACIN <=0.25 SENSITIVE Sensitive     GENTAMICIN <=1 SENSITIVE Sensitive     IMIPENEM <=0.25 SENSITIVE Sensitive     TRIMETH/SULFA <=20 SENSITIVE Sensitive     AMPICILLIN/SULBACTAM <=2 SENSITIVE Sensitive     PIP/TAZO <=4 SENSITIVE Sensitive     Extended ESBL NEGATIVE Sensitive     * FEW ESCHERICHIA COLI    Imaging: No results found.

## 2018-03-01 NOTE — Discharge Summary (Signed)
Physician Discharge Summary  [name]  BWL:[id number]  DOB: Jul 26, 1928  DOA: 02/15/2018 PCP: Cassandria Anger, MD  Admit date: 02/15/2018 Discharge date: 03/01/2018  Admitted From: Home  Disposition: SNF   Recommendations for Outpatient Follow-up:  1. Follow up with PCP in 1-2 weeks 2. Please obtain BMP/CBC in one week to monitor renal function and Hgb   Discharge Condition: Stable  CODE STATUS: DNR  Diet recommendation: Heart Healthy   Brief/Interim Summary: For full details see H&P/Progress note, but in brief, [name] is a 84 year old female with medical history significant of endometrial cancer status post hysterectomy and radiation for more than 30 years ago, sigmoidal cancer status post partial bowel resection and ileostomy and colostomy bag placement, nephrolithiasis status post bilateral nephrostomy tube placement, malnutrition and insomnia who presented to the emergency department complaining of abdominal pain and distention for 1 week prior to admission.  Upon ED evaluation CT A/P without contrast revealed SBO and suspected entero-necrotic mass.  Choledocholithiasis and cholelithiasis with normal LFTs.  Patient admitted with working diagnosis of small bowel obstruction.  General surgery was consulted and recommended conservative management.  Hospital stay was complicated by bradycardia cardiology was consulted, felt to be related to severe malnutrition, bradycardia has resolved. Patient was treated with TPN started on 7/15, this was discontinued on 7/22 after patient was able to tolerated oral diet and SBO resolved. Patient was found to have, intra-abdominal abscess versus enterovaginal fistula, gynecology and surgical team felt that she is not candidate for surgical procedure.  IR was consulted however the correlation was felt to be retrovesical space and drainage was before.  Patient was treated with antibiotic course with Zosyn and Unasyn for total of 8 days.  Body  fluid culture revealed E. coli on 7/14.  Patient was also transfused 2 units of PRBC due to low hemoglobin on presentation to 6.1, after transfusion hemoglobin remains stable.  Surgical team cleared the patient for discharge and it was recommended short-term rehab.  Subjective: Patient seen and examined, she is doing well. Has no new complaints. No acute events overnight. Remains afebrile.   Discharge Diagnoses/Hospital Course:  Principal Problem:   SBO (small bowel obstruction) (HCC) Active Problems:   CANCER, ENDOMETRIUM   FISTULA, ANAL   Abdominal pain, generalized   Insomnia   Colostomy in place [facility])   Nausea   Abdominal distension   Abnormal CT of the abdomen   Hypokalemia   Hypoalbuminemia   Severe protein-calorie malnutrition (HCC)   Microcytic anemia   Acute renal failure superimposed on stage 3 chronic kidney disease (HCC)   Hypotension  SBO felt to be secondary to adhesions from multiple abdominal surgeries Patient was treated conservatively, TPN was given from 7/15 to 7/22.  Patient now tolerating soft diet and colostomy working properly. 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 coli due to continue complaining of vaginal rectal discharge repeated CT scan shows a ?  Pelvic abscess, IR was consulted however collection of retrovesical space was unable to be drained, recommended to treat with antibiotic.  Patient completed antibiotic course with initially Zosyn then transitioned to Unasyn for total of 8 days 7/11 through 7/18. Remains afebrile   Bilateral pleural effusion - resolved  Felt to be secondary to aggressive IV fluid hydration, patient was treated with IV Lasix and subsequently improved. Patient remains off oxygen supplementation without any acute distress.   Asymptomatic sinus bradycardia - resolved  Cardiology was consulted felt to be secondary to severe malnutrition this  now has resolved.  Follow-up as an outpatient.  Severe protein calorie malnutrition Patient was treated with TPN from 7/15 to 7/22 Tolerating oral diet, continue protein supplementation.  Chronic anemia/iron deficiency anemia FOBT was negative Patient was transfused 2 units of PRBC during hospital stay. Hgb remains stable, check CBC in 1 week  Iron supplement Niferex 150 mg daily   CKD stage IV Baseline creatinine around 1.5, remains stable  History of colon cancer status post resection and colostomy  History of nephrolithiasis status post nephrectomy tube placement  History of endometrial cancer status post radiation many years ago   All other chronic medical condition were stable during the hospitalization.  Patient was seen by physical therapy, recommending SNF  On the day of the discharge the patient's vitals were stable, and no other acute medical condition were reported by patient. the patient was felt safe to be discharge to SNF   Discharge Instructions  You were cared for by a hospitalist during your hospital stay. If you have any questions about your discharge medications or the care you received while you were in the hospital after you are discharged, you can call the unit and asked to speak with the hospitalist on call if the hospitalist that took care of you is not available. Once you are discharged, your primary care physician will handle any further medical issues. Please note that NO REFILLS for any discharge medications will be authorized once you are discharged, as it is imperative that you return to your primary care physician (or establish a relationship with a primary care physician if you do not have one) for your aftercare needs so that they can reassess your need for medications and monitor your lab values.  Discharge Instructions    Call MD for:  difficulty breathing, headache or visual disturbances   Complete by:  As directed    Call MD for:  extreme  fatigue   Complete by:  As directed    Call MD for:  hives   Complete by:  As directed    Call MD for:  persistant dizziness or light-headedness   Complete by:  As directed    Call MD for:  persistant nausea and vomiting   Complete by:  As directed    Call MD for:  redness, tenderness, or signs of infection (pain, swelling, redness, odor or green/yellow discharge around incision site)   Complete by:  As directed    Call MD for:  severe uncontrolled pain   Complete by:  As directed    Call MD for:  temperature >100.4   Complete by:  As directed    Diet - low sodium heart healthy   Complete by:  As directed    Increase activity slowly   Complete by:  As directed      Allergies as of 03/01/2018      Reactions   Ciprofloxacin Other (See Comments)   Stomach    Flagyl [metronidazole Hcl]    nausea   Gabapentin    unknown   Raloxifene    unknown   Venlafaxine       Medication List    TAKE these medications   ALIGN 4 MG Caps Take 4 mg by mouth daily. For intestinal flora restoration   cholecalciferol 1000 units tablet Commonly known as:  VITAMIN D Take 1,000-2,000 Units by mouth 2 (two) times daily. 1,000 units at lunch and 2,000 units at  dinner   famotidine 20 MG tablet Commonly known as:  PEPCID Take 1 tablet (20 mg total) by mouth at bedtime.   feeding supplement (ENSURE ENLIVE) Liqd Take 237 mLs by mouth 3 (three) times daily between meals.   furosemide 20 MG tablet Commonly known as:  LASIX TAKE 1 TABLET (20 MG TOTAL) BY MOUTH DAILY AS NEEDED. FOR SWELLING   iron polysaccharides 150 MG capsule Commonly known as:  NIFEREX Take 1 capsule (150 mg total) by mouth daily.   LUTEIN PO Take by mouth.   mirtazapine 15 MG tablet Commonly known as:  REMERON Take 1 tablet (15 mg total) by mouth at bedtime.   oxyCODONE-acetaminophen 10-325 MG tablet Commonly known as:  PERCOCET Take 1 tablet by mouth every 6 (six) hours as needed for pain.   polyethylene glycol  powder powder Commonly known as:  GLYCOLAX/MIRALAX Take 255 g by mouth once.   temazepam 30 MG capsule Commonly known as:  RESTORIL TAKE 1 CAPSULE BY MOUTH AT BEDTIME AS NEEDED FOR SLEEP   VITAMIN B-12 PO Place 3,000 mcg under the tongue daily.       Contact information for follow-up providers    Ileana Roup, MD. Call.   Specialty:  General Surgery Why:  as needed Contact information: [place] [address] [phone]            Contact information for after-discharge care    Destination    [facility] .   Service:  Skilled Nursing Contact information: [place] [address] [phone]                 Allergies  Allergen Reactions  . Ciprofloxacin Other (See Comments)    Stomach   . Flagyl [Metronidazole Hcl]     nausea  . Gabapentin     unknown  . Raloxifene     unknown  . Venlafaxine     Consultations:  General surgery    Procedures/Studies: Ct Abdomen Pelvis Wo Contrast  Result Date: 02/15/2018 CLINICAL DATA:  84 year old female with history of colon cancer and colostomy presenting with obstruction. EXAM: CT ABDOMEN AND PELVIS WITHOUT CONTRAST TECHNIQUE: Multidetector CT imaging of the abdomen and pelvis was performed following the standard protocol without IV contrast. COMPARISON:  Abdominal radiograph dated 02/15/2018. FINDINGS: Evaluation of this exam is limited in the absence of intravenous contrast. Lower chest: The visualized lung bases are clear. There is hypoattenuation of the cardiac blood pool suggestive of a degree of anemia. Clinical correlation is recommended. No intra-abdominal free air or free fluid. Hepatobiliary: The liver is unremarkable on this noncontrast CT. No intrahepatic biliary ductal dilatation. There are multiple stones within the gallbladder with the largest stone measuring approximately 3 cm in length. Ultrasound may provide better evaluation of the  gallbladder if clinically indicated. No pericholecystic fluid. Multiple large stones noted common bile duct. These stones measure up to 11 mm in diameter. Pancreas: Unremarkable. No pancreatic ductal dilatation or surrounding inflammatory changes. Spleen: Normal in size without focal abnormality. Adrenals/Urinary Tract: The adrenal glands are unremarkable. Bilateral percutaneous nephrostomy tubes noted. The tip of the left nephrostomy tube is in the interpolar collecting system of the left kidney. There is mild fullness of the left renal collecting system and left ureter. There is no hydronephrosis on the right. Mechanical or Amplatzer device noted in the proximal left ureter, distal left ureter, as well as 2 additional devices in the distal right ureter. The urinary bladder  is minimally distended and contains air-fluid level. This air is likely related to recent instrumentation, however an infectious process is not excluded. Correlation with urinalysis recommended. There is diffuse thickened and hazy appearance of the bladder wall with stranding of the perivesical fat. Stomach/Bowel: An enteric tube is partially visualized with tip in the body of the stomach. Postsurgical changes of partial colon resection with a left anterior colostomy. Contrast noted in the distal colon and rectum distal to the colostomy, likely related to rectally administered contrast. There is dilatation of fluid-filled loops of small bowel proximal to the colostomy measuring up to 5 cm in diameter. There is a probable transition in the right hemipelvis where there is abutment and tethering of loops of bowel likely representing adhesions (coronal series 4 image 58 and axial series 2, image 55). Evaluation of this area and small bowel is limited due to non opacification with oral contrast. Vascular/Lymphatic: There is moderate aortoiliac atherosclerotic disease. The abdominal aorta and IVC otherwise grossly unremarkable on this noncontrast CT.  No portal venous gas. No definite adenopathy. Reproductive: Hysterectomy. There is an ill-defined 3.1 x 4.9 x 5.0 cm complex collection containing low attenuating fluid/debris and air within the pelvis in the region of the hysterectomy concerning for an infected collection or abscess. This collection may also represent a necrotic mass. There is apparent area of irregularity in the wall of the sigmoid colon (series 4, image 63 and axial series 2, image 52) which may represent an area of fistula between the sigmoid colon and this collection. Evaluation is very limited due to postsurgical changes and absence of contrast. There is diffuse infiltration of the pelvic floor fat and soft tissue infiltration of the posterior pelvis/perirectal space. Other: There is loss of subcutaneous fat and cachexia. There is a lipoma in the anterior left proximal thigh. Musculoskeletal: Osteopenia with degenerative changes of the spine and bilateral hips arthritis. No acute osseous pathology. IMPRESSION: 1. Small-bowel obstruction with transition within the pelvis likely secondary to adhesions. 2. Ill-defined complex collection within the pelvis anterior to the rectum and posterior to the bladder may represent an infected collection/abscess or a necrotic mass. There is irregularity of the wall of the sigmoid colon in this area which may represent a fistula tract extending into the collection. Evaluation is very limited due to postsurgical changes and absence of contrast. Clinical correlation is recommended. 3. Mild fullness of the left renal collecting system and ureter. Diffuse thickening of the bladder wall with air-fluid level content. This may be reactive or related to recent instrumentation or represent cystitis. Correlation with urinalysis recommended. 4. Cholelithiasis as well as multiple stones within the common bile duct. 5.  Aortic Atherosclerosis (ICD10-I70.0). Electronically Signed   By: Anner Crete M.D.   On: 02/15/2018  21:56   Dg Abd 1 View  Result Date: 02/21/2018 CLINICAL DATA:  Abdominal distension EXAM: ABDOMEN - 1 VIEW COMPARISON:  02/19/2018 FINDINGS: Nonobstructive bowel gas pattern. Bilateral pigtail percutaneous nephrostomy catheters. Enteric tube is been removed. Visualized osseous structures are within normal limits. IMPRESSION: Bilateral pigtail percutaneous nephrostomy catheters. Otherwise unremarkable abdominal radiograph. Electronically Signed   By: Julian Hy M.D.   On: 02/21/2018 14:11   Ct Abdomen Pelvis W Contrast  Addendum Date: 02/23/2018   ADDENDUM REPORT: 02/23/2018 14:57 ADDENDUM: The original report was by Dr. Van Clines. The following addendum is by Dr. Van Clines: I reviewed this case on February 23, 2018 in consultation with Dr. Corrie Mckusick. Our consensus after review of the complex pelvic  appearance was that the irregular rim enhancing structure posterior to the severely inflamed urinary bladder likely reflects a distended and markedly inflamed vaginal vault rather than a separate abscess or mass. Based on sagittal images such as image 53/6, the possibility of a wide connection between the lower rectal lumen and this severely inflamed vagina is suspected, from a large fistula or breakdown of the wall between the rectum and vagina. The enhancing 2.6 by 2.4 cm structure in the right hemipelvis mentioned in the original report appears to be separate from the vagina, and is near the expected course of the severely inflamed in indistinct right distal ureter. Electronically Signed   By: Van Clines M.D.   On: 02/23/2018 14:57   Result Date: 02/23/2018 CLINICAL DATA:  Anal fissure.  Abscess. EXAM: CT ABDOMEN AND PELVIS WITH CONTRAST TECHNIQUE: Multidetector CT imaging of the abdomen and pelvis was performed using the standard protocol following bolus administration of intravenous contrast. CONTRAST:  90mL OMNIPAQUE IOHEXOL 300 MG/ML SOLN, 61mL ISOVUE-300 IOPAMIDOL  (ISOVUE-300) INJECTION 61% COMPARISON:  CT scan 02/15/2018 FINDINGS: Lower chest: Large bilateral pleural effusions with passive atelectasis. Hepatobiliary: Gallbladder wall thickening with 2.4 cm in length filling defect in the gallbladder favoring gallstone. Intrahepatic biliary dilatation, with ill definition of the extrahepatic biliary tree but with suspicion for multiple filling defects in the common bile duct favoring choledocholithiasis. 5 mm hypodense lesion in the dome of segment 4a of the liver on image 10/2, nonspecific related to small size. Pancreas: There is diffuse mesenteric edema and this does not appear the peripancreatic region. Strictly speaking I cannot exclude acute pancreatitis. Pseudocyst, pancreatic abscess, or pancreatic necrosis. Correlate with lipase levels. Spleen: Unremarkable Adrenals/Urinary Tract: Both adrenal glands appear unremarkable. There is scarring of the right anterior kidney. Bilateral nephrostomy tubes are present with tubing coiled in the renal collecting systems. There are several devices along the urinary tract which may represent ureteral expanders, although correlation with patient history is recommended with respect to these devices. One of these is in the left mid kidney on image 56/5. There is a device in the right mid ureter on image 40/5 and another similar device just past this in the right mid ureter on image 45/5. A fourth such device is present in the left distal ureter on image 53/5. Correlate with urologic history. The right proximal ureter is expanded and indistinct. The midsection of the ureter is completely obscured by surrounding soft tissue densities, and both distal ureters are extremely indistinct and cannot be readily followed due to surrounding soft tissue densities. A structure believed to represent the urinary bladder on image 54/6 contains gas and complex fluid/material, and has accentuated enhancement along its mucosal margin and prominently  indistinct peripheral wall margins due to surrounding edema. Stomach/Bowel: Probable contraction in the stomach body causing the circumferential wall thickening on image 28/2. The duodenum is somewhat displaced to the right but crosses back to midline with ligament of Treitz in the appropriate position. There are multiple scattered air-fluid levels in dilated loops of small bowel measuring up to about 3.6 cm in diameter. There is wall thickening especially down in the pelvis, with a right pelvic loop of small bowel terminating in the indistinct soft tissue density in the pelvis on 56/2. There is quite likely obstruction in this vicinity. By report the patient has had a low anterior resection. A left ostomy is present. There is a somewhat long mucous fistula extending down towards the pelvis, with the walls of this structure severely indistinct.  There appears to be some dilute contrast medium within this presumed blind-ending pouch, and a small amount of contrast in the thickened rectum on image 60/2. The rectum is notably effaced in the vicinity of a 3.1 by 5.4 by 5.5 cm (volume = 48 cm^3) rim enhancing hypodense collection in the anorectal region posterior to the urinary bladder, suspicious for abscess or tumor. This process extends down towards the perineum. There is also an appearance suspicious for either tumor, abscess, or extravasation in the right hemipelvis in the perirectal region measuring about 2.6 by 2.4 by 2.8 cm (volume = 9.1 cm^3) on image 57/2. Vascular/Lymphatic: Aortoiliac atherosclerotic vascular disease. Aortocaval node 1.0 cm in short axis on image 26/2. The portal vein appears patent. Reproductive: Prior hysterectomy. Severe indistinctness of tissue planes in the pelvis, potentially at least partially due to remote radiation therapy. Ovaries not well seen. Other: Mild ascites. Diffuse edema throughout the omentum mesentery, especially confluent in the pelvis where there is poor delineation of  fascial planes due to the degree of edema. Subcutaneous edema is also present. Musculoskeletal: Lipoma anterior to the left hip. Prominent bilateral degenerative hip arthropathy. Diffuse bony demineralization. Grade 1 degenerative anterolisthesis at L4-5. Presacral edema. IMPRESSION: 1. Highly complex appearance in the pelvis due to indistinctness fat planes, related to a combination of postoperative, post radiation therapy, and inflammatory findings. 2. There appears to be an 48 cubic cm abscess or tumor between the bladder and in the rectal region, extending towards the perineum. There is also a focus of tumor, abscess, or perforation of the mucous fistula in the right hemipelvis in the perirectal region measuring about 9 cubic cm. 3. Bilateral nephrostomy tubes noted, with various devices in the left collecting system and bilateral ureters probably representing ureteral expanders. Correlate with urologic history. The distal halves of both ureters are severely indistinct. 4. Extensive third spacing of fluid with subcutaneous edema, large bilateral pleural effusions, some ascites, and diffuse mesenteric and omental edema. 5. Mildly dilated loops of small bowel with air-fluid levels suspicious for mild small bowel obstruction. A distal small bowel loop extending into the pelvis has a severely effaced segment and may be the lead point for obstruction eccentric to the right for example on image 56/2. 6. Large gallstone with gallbladder wall thickening. Multiple filling defects in the extrahepatic biliary tree suspicious for choledocholithiasis. Intrahepatic biliary dilatation results. 7. Peripancreatic edema is not disproportionate to the mesenteric edema elsewhere, but acute uncomplicated pancreatitis cannot be excluded. 8. Mildly enlarged aortocaval lymph node. 9. Other imaging findings of potential clinical significance: Aortic Atherosclerosis (ICD10-I70.0). Prominent bilateral degenerative hip arthropathy. Bony  demineralization. Lipoma anterior to the left hip. Electronically Signed: By: Van Clines M.D. On: 02/22/2018 18:48   Dg Chest Port 1 View  Result Date: 02/21/2018 CLINICAL DATA:  Anemia, shortness of breath EXAM: PORTABLE CHEST 1 VIEW COMPARISON:  02/15/2018 FINDINGS: Mild interstitial edema. Suspected small bilateral pleural effusions, right greater than left. No pneumothorax. The heart is normal in size. Left arm PICC terminates cavoatrial junction. IMPRESSION: Mild interstitial edema with small bilateral pleural effusions, right greater than left, new. Electronically Signed   By: Julian Hy M.D.   On: 02/21/2018 14:09   Dg Abdomen Acute W/chest  Result Date: 02/15/2018 CLINICAL DATA:  Abdominal distention, evaluate for obstruction EXAM: DG ABDOMEN ACUTE W/ 1V CHEST COMPARISON:  04/22/2016 abdominal CT FINDINGS: A nasogastric tube tip overlaps the proximal stomach. There are bilateral percutaneous nephrostomy tubes. A nasogastric tube tip overlaps the proximal stomach. Dilated  small bowel loops with fluid levels. No pneumoperitoneum. Bilateral ureteral occluders. Large lung volumes. There is no edema, consolidation, effusion, or pneumothorax. IMPRESSION: 1. Findings of small bowel obstruction. 2. Nasogastric tube in good position. Electronically Signed   By: Monte Fantasia M.D.   On: 02/15/2018 19:20   Dg Abd Portable 1v  Result Date: 02/19/2018 CLINICAL DATA:  Small bowel obstruction EXAM: PORTABLE ABDOMEN - 1 VIEW COMPARISON:  February 18, 2018 abdominal radiograph and CT abdomen and pelvis February 15, 2018 FINDINGS: Nasogastric tube tip is in the stomach. The side port is at the gastroesophageal junction. There are pigtail catheters in the left and right abdomen regions, stable. There is no appreciable bowel dilatation or air-fluid level to suggest bowel obstruction. No free air evident. There is evidence of old trauma involving the superior pubic ramus on the right, stable. Lung bases are  clear. Each IMPRESSION: No bowel dilatation or air-fluid level to suggest bowel obstruction. No free air. Note that the nasogastric tube side port is at the gastroesophageal junction. Advise advancing nasogastric tube approximately 5 cm. Pigtail catheters are noted on both sides in the abdomen, draining respective kidneys based on CT appearance. Electronically Signed   By: Lowella Grip III M.D.   On: 02/19/2018 07:15   Dg Abd Portable 1v  Result Date: 02/18/2018 CLINICAL DATA:  Small-bowel obstruction EXAM: PORTABLE ABDOMEN - 1 VIEW COMPARISON:  02/17/2018 FINDINGS: NG tube in the stomach. Nonobstructive bowel gas pattern. Little bowel gas is noted but no dilated loops are present. Bilateral nephrostomies remain in good position. Occlusion balloons are present in the distal ureters bilaterally unchanged IMPRESSION: Nonobstructive bowel gas pattern. Electronically Signed   By: Franchot Gallo M.D.   On: 02/18/2018 07:59   Dg Abd Portable 1v-small Bowel Obstruction Protocol-24 Hr Delay  Result Date: 02/17/2018 CLINICAL DATA:  Small bowel obstruction. EXAM: PORTABLE ABDOMEN - 1 VIEW COMPARISON:  02/16/2018 FINDINGS: Bilateral percutaneous nephrostomy tubes overlie the kidneys. There is a nasogastric tube with tip in the left upper quadrant of the abdomen. Dilated loop of bowel within the left hemiabdomen appears similar to previous exam. Dilute enteric contrast material within small bowel loops overlies the lower lumbar spine and sacrum. IMPRESSION: 1. No change in bowel gas pattern compared with previous exam. Electronically Signed   By: Kerby Moors M.D.   On: 02/17/2018 14:34   Dg Abd Portable 1v-small Bowel Obstruction Protocol-initial, 8 Hr Delay  Result Date: 02/16/2018 CLINICAL DATA:  84 y/o F; small bowel protocol, 8 hour delayed images. EXAM: PORTABLE ABDOMEN - 1 VIEW COMPARISON:  02/15/2018 CT abdomen and pelvis. FINDINGS: Enteric tube tip projects over proximal stomach. Bilateral  nephrostomy tubes in situ. Dilute contrast is present within loops of small bowel within the lower and right hemiabdomen. Persistent small bowel obstruction. Advanced degenerative changes of the lumbar spine with mild levocurvature apex at L3. IMPRESSION: Dilute contrast present within dilated small bowel loops in lower and right hemiabdomen, persistent small bowel obstruction. Electronically Signed   By: Kristine Garbe M.D.   On: 02/16/2018 22:42   [place] Ekg Site Rite  Result Date: 02/19/2018 If Site Rite image not attached, placement could not be confirmed due to current cardiac rhythm.   Discharge Exam: Vitals:   02/28/18 2133 03/01/18 0639  BP: 110/64 (!) 115/55  Pulse: 73 69  Resp: 16 16  Temp: 98.3 F (36.8 C) 98.6 F (37 C)  SpO2: 100% 100%   Vitals:   02/28/18 0457 02/28/18 1326 02/28/18 2133 03/01/18  0639  BP: (!) 116/48 (!) 98/48 110/64 (!) 115/55  Pulse: 79 68 73 69  Resp: 16 16 16 16   Temp: 99.6 F (37.6 C) 98.6 F (37 C) 98.3 F (36.8 C) 98.6 F (37 C)  TempSrc: Oral Oral Oral Oral  SpO2: 97% 100% 100% 100%  Weight:      Height:        General: NAD  Cardiovascular: RRR, S1/S2 +, no rubs, no gallops Respiratory: CTA bilaterally, no wheezing, no rhonchi Abdominal: Soft, NT, ND, + colostomy bag  Extremities: no edema  The results of significant diagnostics from this hospitalization (including imaging, microbiology, ancillary and laboratory) are listed below for reference.     Microbiology: No results found for this or any previous visit (from the past 240 hour(s)).   Labs: BNP (last 3 results) No results for input(s): BNP in the last 8760 hours. Basic Metabolic Panel: Recent Labs  Lab 02/23/18 0240 02/24/18 0420 02/25/18 0459 02/26/18 0457 02/27/18 0422 02/28/18 0404  NA 132* 135 134* 135 143 139  K 3.9 3.5 4.0 4.1 4.7 4.8  CL 104 105 104 106 114* 108  CO2 24 25 25 23 24 24   GLUCOSE 104* 103* 106* 102* 83 93  BUN 20 25* 30* 34* 33* 37*   CREATININE 1.29* 1.25* 1.40* 1.34* 1.39* 1.55*  CALCIUM 8.5* 8.4* 8.8* 9.0 9.4 9.5  MG 1.8 1.7 2.0 2.0 1.9 2.0  PHOS 2.4* 3.5 3.1 3.2  --   --    Liver Function Tests: Recent Labs  Lab 02/26/18 0457  AST 21  ALT 20  ALKPHOS 162*  BILITOT 0.3  PROT 5.2*  ALBUMIN 1.8*   No results for input(s): LIPASE, AMYLASE in the last 168 hours. No results for input(s): AMMONIA in the last 168 hours. CBC: Recent Labs  Lab 02/24/18 0420 02/25/18 0459 02/26/18 0457 02/27/18 0422 02/28/18 0404  WBC 5.2 5.0 4.4 3.5* 4.0  NEUTROABS  --   --  2.9  --   --   HGB 9.4* 9.2* 8.5* 8.7* 8.8*  HCT 30.0* 29.7* 27.5* 27.9* 29.2*  MCV 77.9* 78.6 79.0 79.9 81.3  PLT 146* 146* 166 159 175   Cardiac Enzymes: No results for input(s): CKTOTAL, CKMB, CKMBINDEX, TROPONINI in the last 168 hours. BNP: Invalid input(s): POCBNP CBG: Recent Labs  Lab 02/26/18 2142 02/27/18 0734 02/27/18 0822 02/27/18 1143 02/27/18 1618  GLUCAP 99 63* 101* 114* 141*   D-Dimer No results for input(s): DDIMER in the last 72 hours. Hgb A1c No results for input(s): HGBA1C in the last 72 hours. Lipid Profile No results for input(s): CHOL, HDL, LDLCALC, TRIG, CHOLHDL, LDLDIRECT in the last 72 hours. Thyroid function studies No results for input(s): TSH, T4TOTAL, T3FREE, THYROIDAB in the last 72 hours.  Invalid input(s): FREET3 Anemia work up No results for input(s): VITAMINB12, FOLATE, FERRITIN, TIBC, IRON, RETICCTPCT in the last 72 hours. Urinalysis    Component Value Date/Time   COLORURINE Yellow 03/07/2012 1453   APPEARANCEUR CLOUDY 03/07/2012 1453   LABSPEC >=1.030 03/07/2012 1453   PHURINE 6.0 03/07/2012 1453   GLUCOSEU NEGATIVE 03/07/2012 1453   HGBUR MODERATE 03/07/2012 1453   BILIRUBINUR NEGATIVE 03/07/2012 1453   KETONESUR NEGATIVE 03/07/2012 1453   PROTEINUR >300 (A) 12/07/2007 1146   UROBILINOGEN 0.2 03/07/2012 1453   NITRITE NEGATIVE 03/07/2012 1453   LEUKOCYTESUR LARGE 03/07/2012 1453   Sepsis  Labs Invalid input(s): PROCALCITONIN,  WBC,  LACTICIDVEN Microbiology No results found for this or any previous visit (from the  past 240 hour(s)).   Time coordinating discharge: 32 minutes  SIGNED:  Chipper Oman, MD  [facility] 03/01/2018, 11:35 AM  Pager please text page via  [email or web address]  Note - This record has been created using [employer]. Chart creation errors have been sought, but may not always have been located. Such creation errors do not reflect on the standard of medical care.

## 2018-03-01 NOTE — Care Management Important Message (Signed)
Important Message  Patient Details  Name: AERIANA SPEECE MRN: [id number] Date of Birth: [birth year]   Medicare Important Message Given:  Yes    Kerin Salen 03/01/2018, 10:42 AMImportant Message  Patient Details  Name: SABREENA VOGAN MRN: [id number] Date of Birth: [birth year]   Medicare Important Message Given:  Yes    Kerin Salen 03/01/2018, 10:42 AM

## 2018-03-02 ENCOUNTER — Telehealth: Payer: Self-pay | Admitting: *Deleted

## 2018-03-02 DIAGNOSIS — E43 Unspecified severe protein-calorie malnutrition: Secondary | ICD-10-CM | POA: Diagnosis not present

## 2018-03-02 DIAGNOSIS — K651 Peritoneal abscess: Secondary | ICD-10-CM | POA: Diagnosis not present

## 2018-03-02 DIAGNOSIS — K56609 Unspecified intestinal obstruction, unspecified as to partial versus complete obstruction: Secondary | ICD-10-CM | POA: Diagnosis not present

## 2018-03-02 DIAGNOSIS — G8929 Other chronic pain: Secondary | ICD-10-CM | POA: Diagnosis not present

## 2018-03-02 NOTE — Telephone Encounter (Signed)
Pt was on TCM report admitted 02/15/18 for small bowel obstruction. General surgery was consulted and recommended conservative management.Hospital stay was complicated by bradycardia cardiology was consulted, felt to be related to severe malnutrition, bradycardia has resolved. Patient was treated with TPN started on 7/15, this was discontinued on 7/22 after patient was able to tolerated oral diet and SBO resolved. Patient was found to have,intra-abdominal abscess versus enterovaginal fistula, gynecology and surgical team felt that she is not candidate for surgical procedure. IR was consulted however the correlation was felt to be retrovesical space and drainage was before. Patient was treated with antibiotic course with Zosyn and Unasyn for total of 8 days. Body fluid culture revealed E. coli on 7/14. Patient was also transfused 2 units of PRBC due to low hemoglobin on presentation to 6.1, after transfusion hemoglobin remains stable. Surgical team cleared the patient for discharge 03/01/18,  and was recommended short-term rehab @[facility] .Marland KitchenJohny Chess

## 2018-03-07 DIAGNOSIS — K56609 Unspecified intestinal obstruction, unspecified as to partial versus complete obstruction: Secondary | ICD-10-CM | POA: Diagnosis not present

## 2018-03-07 DIAGNOSIS — Z933 Colostomy status: Secondary | ICD-10-CM | POA: Diagnosis not present

## 2018-03-07 DIAGNOSIS — K651 Peritoneal abscess: Secondary | ICD-10-CM | POA: Diagnosis not present

## 2018-03-07 DIAGNOSIS — E43 Unspecified severe protein-calorie malnutrition: Secondary | ICD-10-CM | POA: Diagnosis not present

## 2018-03-08 DIAGNOSIS — K56609 Unspecified intestinal obstruction, unspecified as to partial versus complete obstruction: Secondary | ICD-10-CM | POA: Diagnosis not present

## 2018-03-08 DIAGNOSIS — Z933 Colostomy status: Secondary | ICD-10-CM | POA: Diagnosis not present

## 2018-03-09 DIAGNOSIS — K56609 Unspecified intestinal obstruction, unspecified as to partial versus complete obstruction: Secondary | ICD-10-CM | POA: Diagnosis not present

## 2018-03-09 DIAGNOSIS — R531 Weakness: Secondary | ICD-10-CM | POA: Diagnosis not present

## 2018-03-09 DIAGNOSIS — E43 Unspecified severe protein-calorie malnutrition: Secondary | ICD-10-CM | POA: Diagnosis not present

## 2018-03-12 DIAGNOSIS — Z933 Colostomy status: Secondary | ICD-10-CM | POA: Diagnosis not present

## 2018-03-12 DIAGNOSIS — K56609 Unspecified intestinal obstruction, unspecified as to partial versus complete obstruction: Secondary | ICD-10-CM | POA: Diagnosis not present

## 2018-03-12 DIAGNOSIS — G8929 Other chronic pain: Secondary | ICD-10-CM | POA: Diagnosis not present

## 2018-03-12 DIAGNOSIS — E43 Unspecified severe protein-calorie malnutrition: Secondary | ICD-10-CM | POA: Diagnosis not present

## 2018-03-13 DIAGNOSIS — Z933 Colostomy status: Secondary | ICD-10-CM | POA: Diagnosis not present

## 2018-03-13 DIAGNOSIS — K56609 Unspecified intestinal obstruction, unspecified as to partial versus complete obstruction: Secondary | ICD-10-CM | POA: Diagnosis not present

## 2018-03-13 DIAGNOSIS — R531 Weakness: Secondary | ICD-10-CM | POA: Diagnosis not present

## 2018-03-14 DIAGNOSIS — G8929 Other chronic pain: Secondary | ICD-10-CM | POA: Diagnosis not present

## 2018-03-14 DIAGNOSIS — K56609 Unspecified intestinal obstruction, unspecified as to partial versus complete obstruction: Secondary | ICD-10-CM | POA: Diagnosis not present

## 2018-03-14 DIAGNOSIS — Z933 Colostomy status: Secondary | ICD-10-CM | POA: Diagnosis not present

## 2018-03-15 DIAGNOSIS — K56609 Unspecified intestinal obstruction, unspecified as to partial versus complete obstruction: Secondary | ICD-10-CM | POA: Diagnosis not present

## 2018-03-15 DIAGNOSIS — E43 Unspecified severe protein-calorie malnutrition: Secondary | ICD-10-CM | POA: Diagnosis not present

## 2018-03-16 DIAGNOSIS — R531 Weakness: Secondary | ICD-10-CM | POA: Diagnosis not present

## 2018-03-16 DIAGNOSIS — K56609 Unspecified intestinal obstruction, unspecified as to partial versus complete obstruction: Secondary | ICD-10-CM | POA: Diagnosis not present

## 2018-03-16 DIAGNOSIS — E43 Unspecified severe protein-calorie malnutrition: Secondary | ICD-10-CM | POA: Diagnosis not present

## 2018-03-28 ENCOUNTER — Telehealth: Payer: Self-pay

## 2018-03-28 NOTE — Telephone Encounter (Signed)
FYI  Copied from [place] [id number]. Topic: Quick Communication - See Telephone Encounter >> Mar 28, 2018  1:32 PM Synthia Innocent wrote: CRM for notification. FYI  Per Home Health, patient/family declined all services, PT and OT.

## 2018-03-29 NOTE — Telephone Encounter (Signed)
Noted! Thank you

## 2018-03-31 ENCOUNTER — Other Ambulatory Visit: Payer: Self-pay | Admitting: Internal Medicine

## 2018-04-02 ENCOUNTER — Telehealth: Payer: Self-pay | Admitting: Internal Medicine

## 2018-04-02 NOTE — Telephone Encounter (Signed)
She should have Rx though [facility] Thx

## 2018-04-02 NOTE — Telephone Encounter (Signed)
Copied from [place] [id number]. Topic: Quick Communication - See Telephone Encounter >> Apr 02, 2018  8:55 AM Ahmed Prima L wrote: CRM for notification. See Telephone encounter for: 04/02/18.  oxyCODONE-acetaminophen (PERCOCET) 10-325 MG tablet. Patient would like the nurse to give her a call  [id number] - [place], [place]

## 2018-04-02 NOTE — Telephone Encounter (Signed)
Called pt no answer LMOM w/MD response../lmb 

## 2018-04-10 DIAGNOSIS — N131 Hydronephrosis with ureteral stricture, not elsewhere classified: Secondary | ICD-10-CM | POA: Diagnosis not present

## 2018-04-10 DIAGNOSIS — Z466 Encounter for fitting and adjustment of urinary device: Secondary | ICD-10-CM | POA: Diagnosis not present

## 2018-04-10 DIAGNOSIS — Z933 Colostomy status: Secondary | ICD-10-CM | POA: Diagnosis not present

## 2018-04-10 DIAGNOSIS — N820 Vesicovaginal fistula: Secondary | ICD-10-CM | POA: Diagnosis not present

## 2018-04-10 NOTE — Telephone Encounter (Signed)
Pt called and stated that she only got a 10 day supply and she is in a lot of pain and needs a refill. Please advise

## 2018-04-10 NOTE — Telephone Encounter (Signed)
Check [place] registry last filled 12/11/17. Printed narcotic registry place on MD counter top to review.Marland KitchenJohny Chess

## 2018-04-11 NOTE — Telephone Encounter (Signed)
I do not understand. Does she have Rx through [facility] per Rx? Thx

## 2018-04-17 NOTE — Telephone Encounter (Signed)
Pt has appt w/MD 05-27-18 will discuss at apt.Marland KitchenJohny Chess

## 2018-04-30 ENCOUNTER — Ambulatory Visit: Payer: Medicare HMO | Admitting: Internal Medicine

## 2018-05-07 ENCOUNTER — Telehealth: Payer: Self-pay

## 2018-05-07 NOTE — Assessment & Plan Note (Signed)
BP Readings from Last 3 Encounters:  03/01/18 (!) 101/43  02/14/18 (!) 96/54  11/15/17 (!) 38/18

## 2018-05-07 NOTE — Telephone Encounter (Signed)
On 05/07/18 I received the d/c back from Doctor Plotnikov. I got the d/c ready and called the funeral home to let them know the d/c is ready for pickup.  I also faxed a copy to the funeral home per the funeral home request.

## 2018-05-07 NOTE — Assessment & Plan Note (Signed)
Recurrent To ER if worse

## 2018-05-07 NOTE — Assessment & Plan Note (Signed)
Monitoring labs 

## 2018-05-08 DEATH — deceased

## 2018-05-22 ENCOUNTER — Encounter: Payer: Medicare HMO | Admitting: Internal Medicine

## 2018-06-13 ENCOUNTER — Encounter: Payer: Medicare HMO | Admitting: Internal Medicine

## 2019-08-16 IMAGING — CT CT ABD-PELV W/O CM
2 of 4 series · 14 of 46 positions shown, 16 images · non-contrast
Comparison: Abdominal radiograph dated 02/15/2018.

CLINICAL DATA: 89-year-old female with history of colon cancer and
colostomy presenting with obstruction.

EXAM:
CT ABDOMEN AND PELVIS WITHOUT CONTRAST
TECHNIQUE: Multidetector CT imaging of the abdomen and pelvis was performed
following the standard protocol without IV contrast.

[Series 2: axial st · axial · 0.74mm/px · z∈[-579,-224]mm · 11 of 79 slices shown, 13 images]
[im 4/79  soft-tissue]
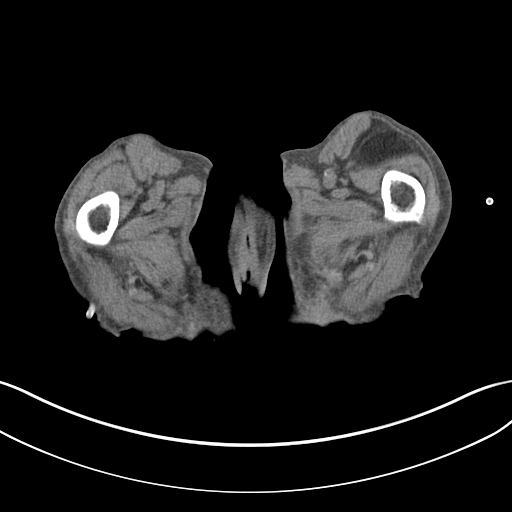
[im 4/79  bone]
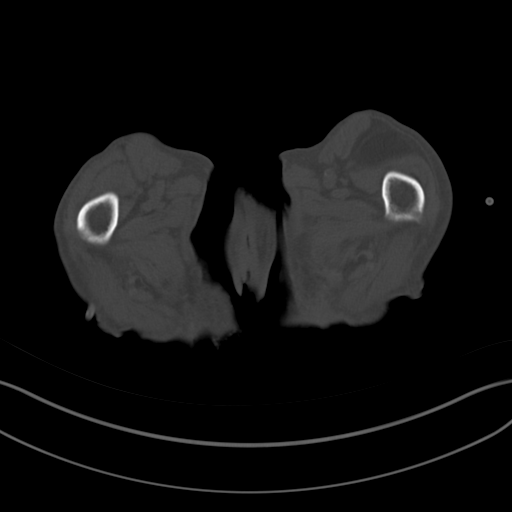
[im 12/79  soft-tissue]
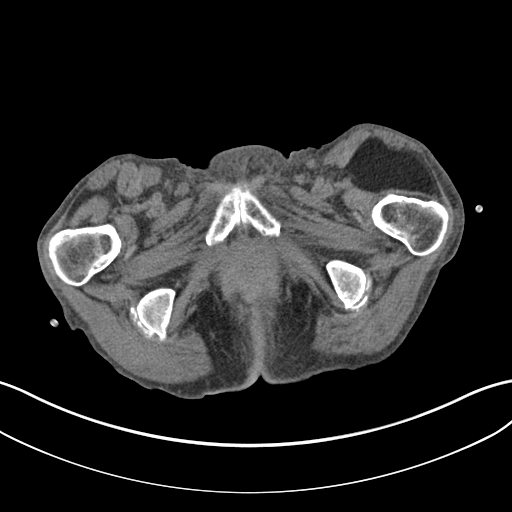
[im 20/79  soft-tissue]
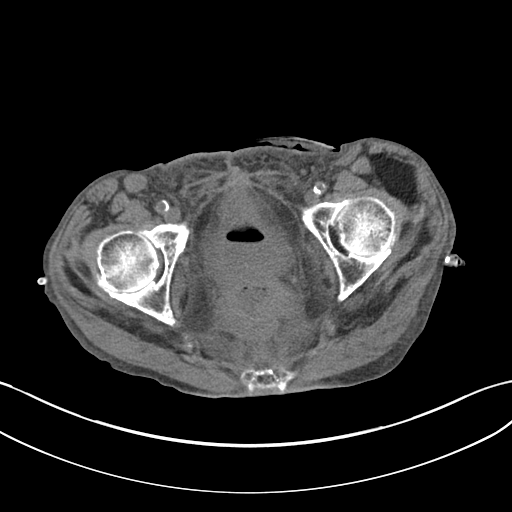
[im 28/79  soft-tissue]
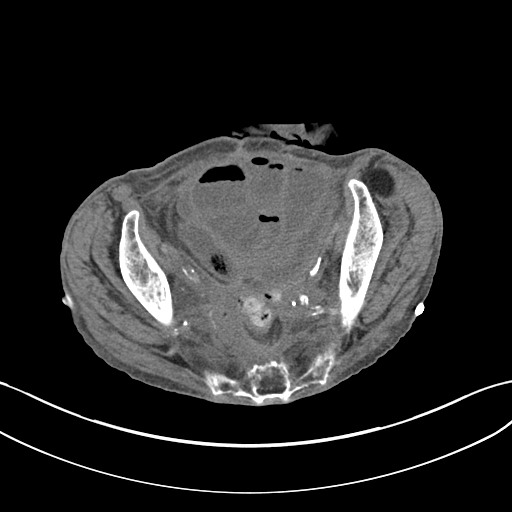
[im 32/79  soft-tissue]
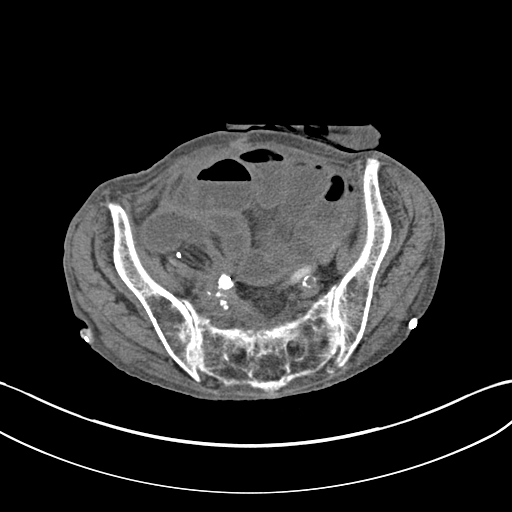
[im 40/79  soft-tissue]
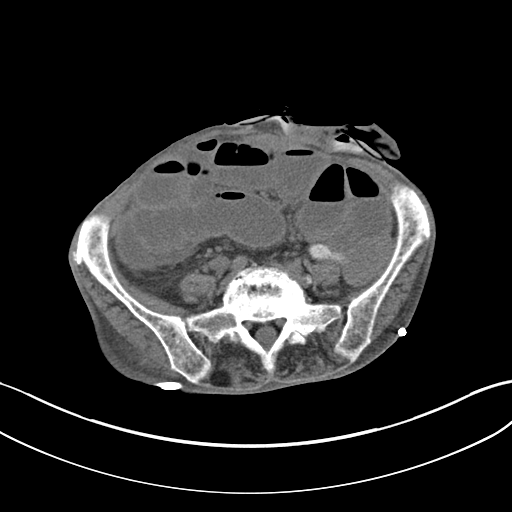
[im 47/79  soft-tissue]
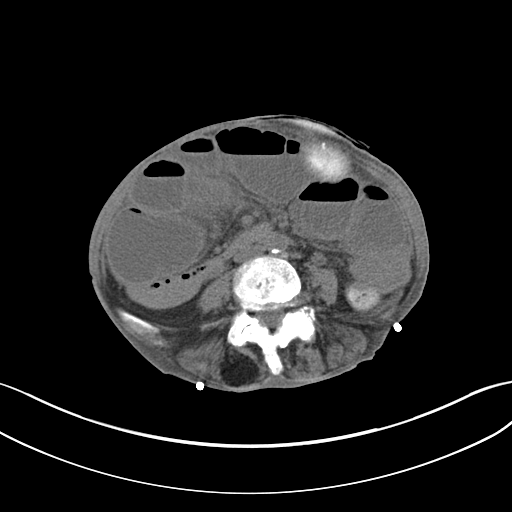
[im 51/79  soft-tissue]
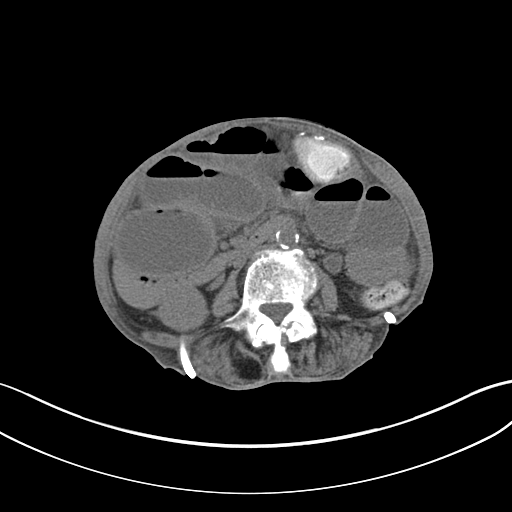
[im 59/79  soft-tissue]
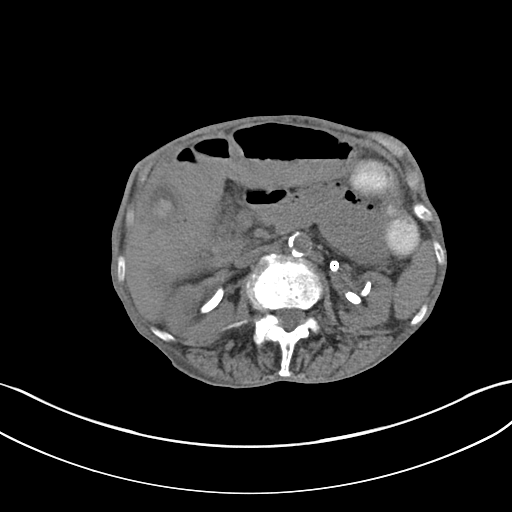
[im 59/79  bone]
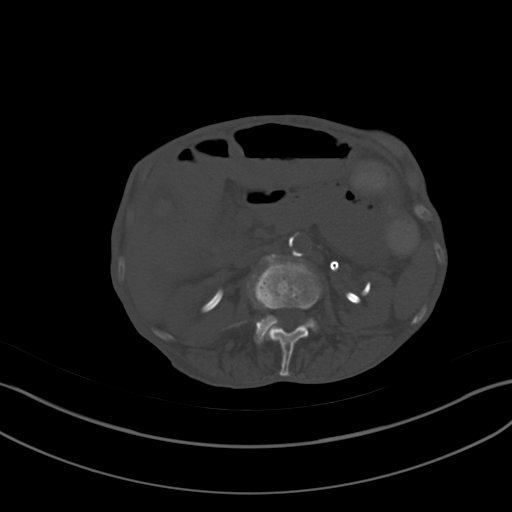
[im 67/79  soft-tissue]
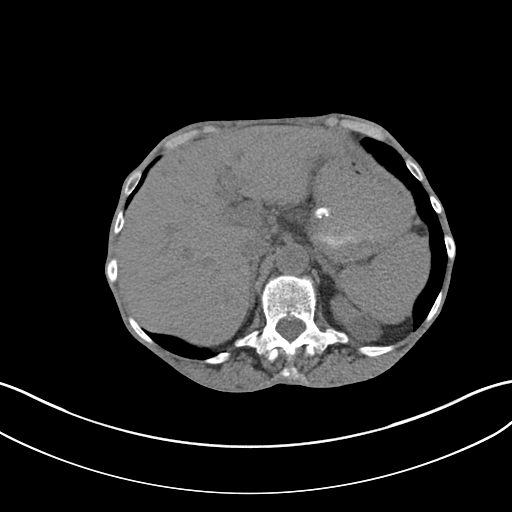
[im 75/79  soft-tissue]
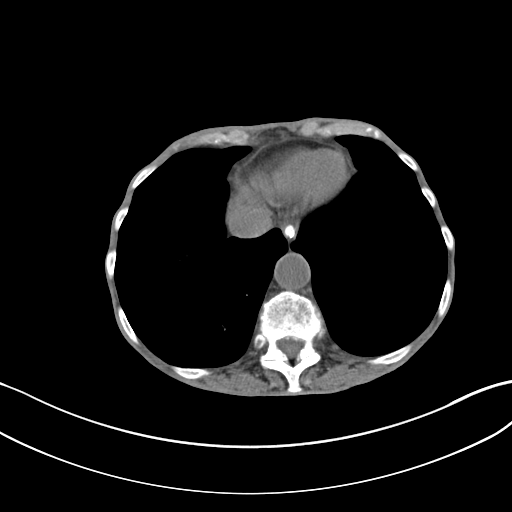

[Series 4: coronal st · coronal · 0.61mm/px · 3 of 100 slices shown]
[im 34/100  soft-tissue]
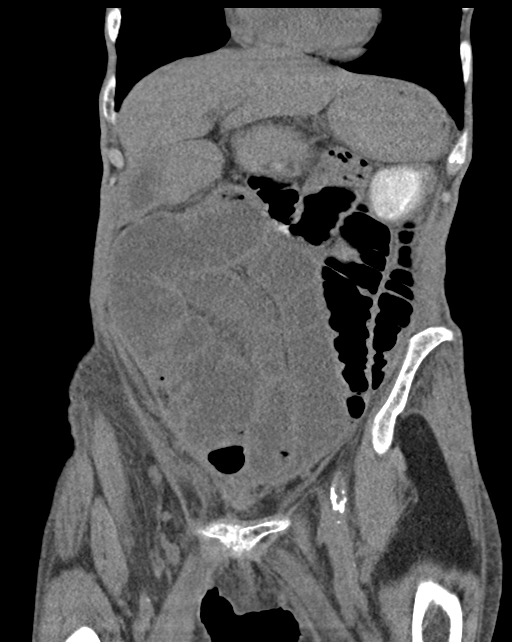
[im 45/100  soft-tissue]
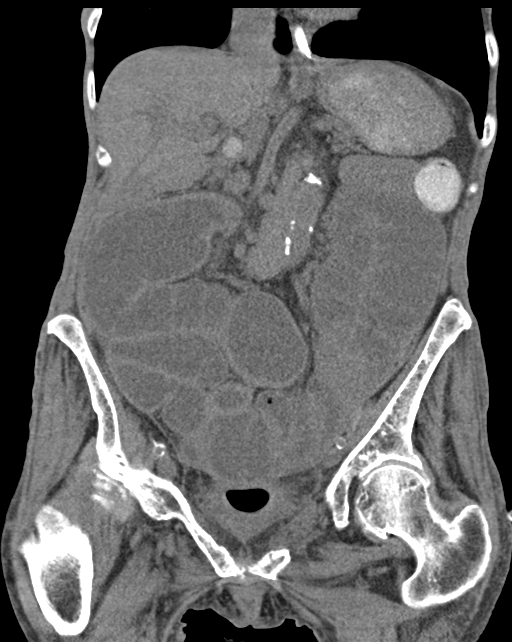
[im 56/100  soft-tissue]
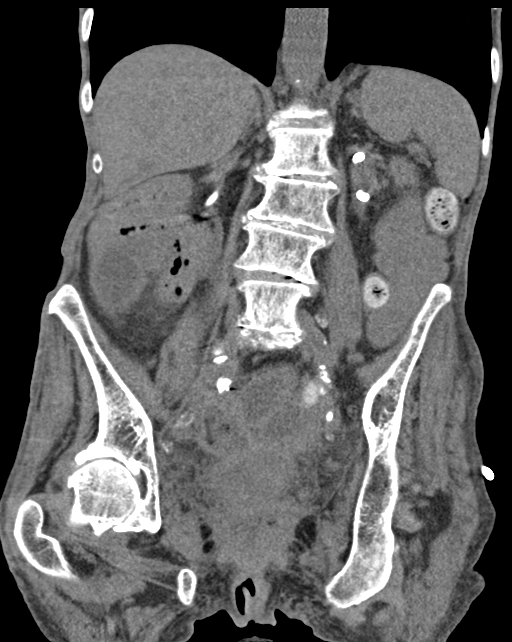

[14 of 46 positions shown; findings below may reference images not displayed]

FINDINGS: Evaluation of this exam is limited in the absence of intravenous
contrast.

Lower chest: The visualized lung bases are clear. There is
hypoattenuation of the cardiac blood pool suggestive of a degree of
anemia. Clinical correlation is recommended.

No intra-abdominal free air or free fluid.

Hepatobiliary: The liver is unremarkable on this noncontrast CT. No
intrahepatic biliary ductal dilatation. There are multiple stones
within the gallbladder with the largest stone measuring
approximately 3 cm in length. Ultrasound may provide better
evaluation of the gallbladder if clinically indicated. No
pericholecystic fluid. Multiple large stones noted common bile duct.
These stones measure up to 11 mm in diameter.

Pancreas: Unremarkable. No pancreatic ductal dilatation or
surrounding inflammatory changes.

Spleen: Normal in size without focal abnormality.

Adrenals/Urinary Tract: The adrenal glands are unremarkable.
Bilateral percutaneous nephrostomy tubes noted. The tip of the left
nephrostomy tube is in the interpolar collecting system of the left
kidney. There is mild fullness of the left renal collecting system
and left ureter. There is no hydronephrosis on the right. Mechanical
or Amplatzer device noted in the proximal left ureter, distal left
ureter, as well as 2 additional devices in the distal right ureter.
The urinary bladder is minimally distended and contains air-fluid
level. This air is likely related to recent instrumentation, however
an infectious process is not excluded. Correlation with urinalysis
recommended. There is diffuse thickened and hazy appearance of the
bladder wall with stranding of the perivesical fat.

Stomach/Bowel: An enteric tube is partially visualized with tip in
the body of the stomach. Postsurgical changes of partial colon
resection with a left anterior colostomy. Contrast noted in the
distal colon and rectum distal to the colostomy, likely related to
rectally administered contrast. There is dilatation of fluid-filled
loops of small bowel proximal to the colostomy measuring up to 5 cm
in diameter. There is a probable transition in the right hemipelvis
where there is abutment and tethering of loops of bowel likely
representing adhesions (coronal series 4 image 58 and axial series
2, image 55). Evaluation of this area and small bowel is limited due
to non opacification with oral contrast.

Vascular/Lymphatic: There is moderate aortoiliac atherosclerotic
disease. The abdominal aorta and IVC otherwise grossly unremarkable
on this noncontrast CT. No portal venous gas. No definite
adenopathy.

Reproductive: Hysterectomy. There is an ill-defined 3.1 x 4.9 x
cm complex collection containing low attenuating fluid/debris and
air within the pelvis in the region of the hysterectomy concerning
for an infected collection or abscess. This collection may also
represent a necrotic mass. There is apparent area of irregularity in
the wall of the sigmoid colon (series 4, image 63 and axial series
2, image 52) which may represent an area of fistula between the
sigmoid colon and this collection. Evaluation is very limited due to
postsurgical changes and absence of contrast. There is diffuse
infiltration of the pelvic floor fat and soft tissue infiltration of
the posterior pelvis/perirectal space.

Other: There is loss of subcutaneous fat and cachexia. There is a
lipoma in the anterior left proximal thigh.

Musculoskeletal: Osteopenia with degenerative changes of the spine
and bilateral hips arthritis. No acute osseous pathology.
IMPRESSION: 1. Small-bowel obstruction with transition within the pelvis likely
secondary to adhesions.
2. Ill-defined complex collection within the pelvis anterior to the
rectum and posterior to the bladder may represent an infected
collection/abscess or a necrotic mass. There is irregularity of the
wall of the sigmoid colon in this area which may represent a fistula
tract extending into the collection. Evaluation is very limited due
to postsurgical changes and absence of contrast. Clinical
correlation is recommended.
3. Mild fullness of the left renal collecting system and ureter.
Diffuse thickening of the bladder wall with air-fluid level content.
This may be reactive or related to recent instrumentation or
represent cystitis. Correlation with urinalysis recommended.
4. Cholelithiasis as well as multiple stones within the common bile
duct.
5.  Aortic Atherosclerosis ([id number]).
